# Patient Record
Sex: Female | Born: 1980 | Race: White | Hispanic: No | Marital: Married | Smoking: Former smoker
Health system: Southern US, Community
[De-identification: ages and names within clinical notes are randomized; demographics above are authoritative.]

## PROBLEM LIST (undated history)

## (undated) ENCOUNTER — Inpatient Hospital Stay (HOSPITAL_COMMUNITY): Payer: Self-pay

## (undated) DIAGNOSIS — G43909 Migraine, unspecified, not intractable, without status migrainosus: Secondary | ICD-10-CM

## (undated) DIAGNOSIS — N809 Endometriosis, unspecified: Secondary | ICD-10-CM

## (undated) DIAGNOSIS — G40909 Epilepsy, unspecified, not intractable, without status epilepticus: Secondary | ICD-10-CM

## (undated) DIAGNOSIS — O24419 Gestational diabetes mellitus in pregnancy, unspecified control: Secondary | ICD-10-CM

## (undated) DIAGNOSIS — D563 Thalassemia minor: Secondary | ICD-10-CM

## (undated) DIAGNOSIS — D569 Thalassemia, unspecified: Secondary | ICD-10-CM

## (undated) DIAGNOSIS — I639 Cerebral infarction, unspecified: Secondary | ICD-10-CM

## (undated) DIAGNOSIS — F419 Anxiety disorder, unspecified: Secondary | ICD-10-CM

## (undated) DIAGNOSIS — F32A Depression, unspecified: Secondary | ICD-10-CM

## (undated) DIAGNOSIS — R569 Unspecified convulsions: Secondary | ICD-10-CM

## (undated) DIAGNOSIS — A64 Unspecified sexually transmitted disease: Secondary | ICD-10-CM

## (undated) DIAGNOSIS — K589 Irritable bowel syndrome without diarrhea: Secondary | ICD-10-CM

## (undated) DIAGNOSIS — I63212 Cerebral infarction due to unspecified occlusion or stenosis of left vertebral arteries: Secondary | ICD-10-CM

## (undated) HISTORY — DX: Cerebral infarction, unspecified: I63.9

## (undated) HISTORY — DX: Irritable bowel syndrome, unspecified: K58.9

## (undated) HISTORY — DX: Anxiety disorder, unspecified: F41.9

## (undated) HISTORY — DX: Thalassemia minor: D56.3

## (undated) HISTORY — DX: Unspecified convulsions: R56.9

## (undated) HISTORY — DX: Migraine, unspecified, not intractable, without status migrainosus: G43.909

## (undated) HISTORY — DX: Unspecified sexually transmitted disease: A64

## (undated) HISTORY — DX: Cerebral infarction due to unspecified occlusion or stenosis of left vertebral artery: I63.212

## (undated) HISTORY — DX: Depression, unspecified: F32.A

## (undated) HISTORY — DX: Endometriosis, unspecified: N80.9

## (undated) HISTORY — DX: Gestational diabetes mellitus in pregnancy, unspecified control: O24.419

---
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
## 2005-10-19 ENCOUNTER — Other Ambulatory Visit: Admission: RE | Admit: 2005-10-19 | Discharge: 2005-10-19 | Payer: Self-pay | Admitting: Internal Medicine

## 2007-02-11 ENCOUNTER — Emergency Department (HOSPITAL_COMMUNITY): Admission: EM | Admit: 2007-02-11 | Discharge: 2007-02-11 | Payer: Self-pay | Admitting: Emergency Medicine

## 2007-10-01 ENCOUNTER — Ambulatory Visit: Payer: Self-pay | Admitting: *Deleted

## 2007-10-01 ENCOUNTER — Encounter (INDEPENDENT_AMBULATORY_CARE_PROVIDER_SITE_OTHER): Payer: Self-pay | Admitting: Orthopedic Surgery

## 2007-10-01 ENCOUNTER — Ambulatory Visit (HOSPITAL_COMMUNITY): Admission: RE | Admit: 2007-10-01 | Discharge: 2007-10-01 | Payer: Self-pay | Admitting: Orthopedic Surgery

## 2009-09-26 ENCOUNTER — Emergency Department (HOSPITAL_COMMUNITY): Admission: EM | Admit: 2009-09-26 | Discharge: 2009-09-26 | Payer: Self-pay | Admitting: Emergency Medicine

## 2010-05-02 ENCOUNTER — Emergency Department (HOSPITAL_COMMUNITY): Admission: EM | Admit: 2010-05-02 | Discharge: 2010-05-02 | Payer: Self-pay | Admitting: Emergency Medicine

## 2010-07-18 ENCOUNTER — Emergency Department (HOSPITAL_COMMUNITY)
Admission: EM | Admit: 2010-07-18 | Discharge: 2010-07-18 | Payer: Self-pay | Source: Home / Self Care | Admitting: Emergency Medicine

## 2010-07-26 ENCOUNTER — Other Ambulatory Visit (HOSPITAL_COMMUNITY): Payer: Self-pay | Admitting: Obstetrics & Gynecology

## 2010-07-26 DIAGNOSIS — H539 Unspecified visual disturbance: Secondary | ICD-10-CM

## 2010-08-02 ENCOUNTER — Encounter (HOSPITAL_COMMUNITY): Payer: Self-pay

## 2010-08-02 ENCOUNTER — Ambulatory Visit (HOSPITAL_COMMUNITY)
Admission: RE | Admit: 2010-08-02 | Discharge: 2010-08-02 | Disposition: A | Discharge status: Home / Self Care | Payer: Medicare HMO | Source: Ambulatory Visit | Attending: Obstetrics & Gynecology | Admitting: Obstetrics & Gynecology

## 2010-08-02 DIAGNOSIS — H539 Unspecified visual disturbance: Secondary | ICD-10-CM

## 2010-08-02 DIAGNOSIS — Z1389 Encounter for screening for other disorder: Secondary | ICD-10-CM | POA: Insufficient documentation

## 2010-08-02 DIAGNOSIS — O358XX0 Maternal care for other (suspected) fetal abnormality and damage, not applicable or unspecified: Secondary | ICD-10-CM | POA: Insufficient documentation

## 2010-08-02 DIAGNOSIS — Z363 Encounter for antenatal screening for malformations: Secondary | ICD-10-CM | POA: Insufficient documentation

## 2010-09-05 LAB — URINALYSIS, ROUTINE W REFLEX MICROSCOPIC
Glucose, UA: NEGATIVE mg/dL
Hgb urine dipstick: NEGATIVE
Ketones, ur: NEGATIVE mg/dL
Protein, ur: NEGATIVE mg/dL
Urobilinogen, UA: 0.2 mg/dL (ref 0.0–1.0)

## 2010-09-05 LAB — DIFFERENTIAL
Basophils Absolute: 0 10*3/uL (ref 0.0–0.1)
Eosinophils Absolute: 0.1 10*3/uL (ref 0.0–0.7)
Lymphs Abs: 2.1 10*3/uL (ref 0.7–4.0)
Monocytes Absolute: 0.6 10*3/uL (ref 0.1–1.0)
Neutrophils Relative %: 65 % (ref 43–77)

## 2010-09-05 LAB — BASIC METABOLIC PANEL
BUN: 12 mg/dL (ref 6–23)
CO2: 24 mEq/L (ref 19–32)
Calcium: 8.9 mg/dL (ref 8.4–10.5)
Chloride: 109 mEq/L (ref 96–112)
Creatinine, Ser: 0.64 mg/dL (ref 0.4–1.2)

## 2010-09-05 LAB — CBC
MCH: 20.6 pg — ABNORMAL LOW (ref 26.0–34.0)
MCHC: 32.1 g/dL (ref 30.0–36.0)
MCV: 64.2 fL — ABNORMAL LOW (ref 78.0–100.0)
Platelets: 172 10*3/uL (ref 150–400)
RDW: 15.4 % (ref 11.5–15.5)

## 2010-09-05 LAB — WET PREP, GENITAL: Clue Cells Wet Prep HPF POC: NONE SEEN

## 2010-09-05 LAB — GC/CHLAMYDIA PROBE AMP, GENITAL: Chlamydia, DNA Probe: NEGATIVE

## 2010-09-13 LAB — URINALYSIS, ROUTINE W REFLEX MICROSCOPIC
Ketones, ur: NEGATIVE mg/dL
Nitrite: NEGATIVE
Specific Gravity, Urine: 1.023 (ref 1.005–1.030)
pH: 5.5 (ref 5.0–8.0)

## 2010-09-13 LAB — POCT PREGNANCY, URINE: Preg Test, Ur: NEGATIVE

## 2010-09-13 LAB — POCT I-STAT, CHEM 8
Calcium, Ion: 1.26 mmol/L (ref 1.12–1.32)
Chloride: 106 mEq/L (ref 96–112)
HCT: 37 % (ref 36.0–46.0)
Sodium: 140 mEq/L (ref 135–145)

## 2010-09-24 ENCOUNTER — Inpatient Hospital Stay (HOSPITAL_COMMUNITY): Admission: AD | Admit: 2010-09-24 | Payer: Medicare HMO | Source: Ambulatory Visit | Admitting: Obstetrics & Gynecology

## 2010-10-18 ENCOUNTER — Encounter: Payer: Medicare HMO | Attending: Obstetrics & Gynecology

## 2010-10-18 DIAGNOSIS — Z713 Dietary counseling and surveillance: Secondary | ICD-10-CM | POA: Insufficient documentation

## 2010-10-18 DIAGNOSIS — O99810 Abnormal glucose complicating pregnancy: Secondary | ICD-10-CM | POA: Insufficient documentation

## 2010-12-25 ENCOUNTER — Inpatient Hospital Stay (HOSPITAL_COMMUNITY)
Admission: AD | Admit: 2010-12-25 | Discharge: 2010-12-26 | Disposition: A | Discharge status: Home / Self Care | Payer: Medicare HMO | Source: Ambulatory Visit | Attending: Obstetrics | Admitting: Obstetrics

## 2010-12-25 DIAGNOSIS — O479 False labor, unspecified: Secondary | ICD-10-CM | POA: Insufficient documentation

## 2011-01-07 ENCOUNTER — Inpatient Hospital Stay (HOSPITAL_COMMUNITY): Payer: Managed Care, Other (non HMO) | Admitting: Anesthesiology

## 2011-01-07 ENCOUNTER — Encounter (HOSPITAL_COMMUNITY): Payer: Self-pay | Admitting: Anesthesiology

## 2011-01-07 ENCOUNTER — Inpatient Hospital Stay (HOSPITAL_COMMUNITY): Admission: RE | Admit: 2011-01-07 | Payer: Medicare HMO | Source: Ambulatory Visit

## 2011-01-07 ENCOUNTER — Inpatient Hospital Stay (HOSPITAL_COMMUNITY)
Admission: AD | Admit: 2011-01-07 | Discharge: 2011-01-10 | DRG: 775 | Disposition: A | Discharge status: Home / Self Care | Payer: Managed Care, Other (non HMO) | Source: Ambulatory Visit | Attending: Obstetrics and Gynecology | Admitting: Obstetrics and Gynecology

## 2011-01-07 ENCOUNTER — Encounter (HOSPITAL_COMMUNITY): Payer: Self-pay | Admitting: *Deleted

## 2011-01-07 DIAGNOSIS — O99814 Abnormal glucose complicating childbirth: Secondary | ICD-10-CM | POA: Diagnosis present

## 2011-01-07 DIAGNOSIS — D62 Acute posthemorrhagic anemia: Secondary | ICD-10-CM | POA: Diagnosis not present

## 2011-01-07 DIAGNOSIS — O480 Post-term pregnancy: Secondary | ICD-10-CM | POA: Diagnosis present

## 2011-01-07 DIAGNOSIS — O9903 Anemia complicating the puerperium: Secondary | ICD-10-CM | POA: Diagnosis not present

## 2011-01-07 DIAGNOSIS — G40909 Epilepsy, unspecified, not intractable, without status epilepticus: Secondary | ICD-10-CM

## 2011-01-07 DIAGNOSIS — O24919 Unspecified diabetes mellitus in pregnancy, unspecified trimester: Secondary | ICD-10-CM | POA: Diagnosis present

## 2011-01-07 HISTORY — DX: Epilepsy, unspecified, not intractable, without status epilepticus: G40.909

## 2011-01-07 LAB — ABO/RH: RH Type: POSITIVE

## 2011-01-07 LAB — RUBELLA ANTIBODY, IGM: Rubella: IMMUNE

## 2011-01-07 LAB — HIV ANTIBODY (ROUTINE TESTING W REFLEX): HIV: NONREACTIVE

## 2011-01-07 LAB — CBC
HCT: 35.9 % — ABNORMAL LOW (ref 36.0–46.0)
Hemoglobin: 11.9 g/dL — ABNORMAL LOW (ref 12.0–15.0)
MCV: 65.3 fL — ABNORMAL LOW (ref 78.0–100.0)
RDW: 15.4 % (ref 11.5–15.5)
WBC: 13.1 10*3/uL — ABNORMAL HIGH (ref 4.0–10.5)

## 2011-01-07 LAB — TYPE AND SCREEN: Antibody Screen: NEGATIVE

## 2011-01-07 LAB — STREP B DNA PROBE: GBS: NEGATIVE

## 2011-01-07 LAB — GLUCOSE, CAPILLARY

## 2011-01-07 MED ORDER — FENTANYL 2.5 MCG/ML BUPIVACAINE 1/10 % EPIDURAL INFUSION (WH - ANES)
14.0000 mL/h | INTRAMUSCULAR | Status: DC
  Administered 2011-01-08: 14 mL/h via EPIDURAL
  Filled 2011-01-07 (×2): qty 60

## 2011-01-07 MED ORDER — PHENYLEPHRINE 40 MCG/ML (10ML) SYRINGE FOR IV PUSH (FOR BLOOD PRESSURE SUPPORT)
80.0000 ug | PREFILLED_SYRINGE | INTRAVENOUS | Status: DC | PRN
  Filled 2011-01-07: qty 5

## 2011-01-07 MED ORDER — EPHEDRINE 5 MG/ML INJ: 10.0000 mg | INTRAVENOUS | Status: DC | PRN

## 2011-01-07 MED ORDER — ONDANSETRON HCL 4 MG/2ML IJ SOLN: 4.0000 mg | Freq: Four times a day (QID) | INTRAMUSCULAR | Status: DC | PRN

## 2011-01-07 MED ORDER — PHENYLEPHRINE 40 MCG/ML (10ML) SYRINGE FOR IV PUSH (FOR BLOOD PRESSURE SUPPORT): 80.0000 ug | PREFILLED_SYRINGE | INTRAVENOUS | Status: DC | PRN

## 2011-01-07 MED ORDER — OXYCODONE-ACETAMINOPHEN 5-325 MG PO TABS: 2.0000 | ORAL_TABLET | ORAL | Status: DC | PRN

## 2011-01-07 MED ORDER — DIPHENHYDRAMINE HCL 50 MG/ML IJ SOLN: 12.5000 mg | INTRAMUSCULAR | Status: DC | PRN

## 2011-01-07 MED ORDER — OXCARBAZEPINE 300 MG PO TABS
150.0000 mg | ORAL_TABLET | Freq: Every day | ORAL | Status: DC
  Administered 2011-01-08 – 2011-01-09 (×2): 150 mg via ORAL
  Administered 2011-01-10: 11:00:00 via ORAL
  Filled 2011-01-07 (×3): qty 1

## 2011-01-07 MED ORDER — OXCARBAZEPINE 300 MG PO TABS
300.0000 mg | ORAL_TABLET | Freq: Every day | ORAL | Status: DC
  Administered 2011-01-07 – 2011-01-09 (×3): 300 mg via ORAL
  Filled 2011-01-07 (×2): qty 1

## 2011-01-07 MED ORDER — LIDOCAINE HCL 1.5 % IJ SOLN
INTRAMUSCULAR | Status: DC | PRN
  Administered 2011-01-07 (×2): 4 mL

## 2011-01-07 MED ORDER — CITRIC ACID-SODIUM CITRATE 334-500 MG/5ML PO SOLN: 30.0000 mL | ORAL | Status: DC | PRN

## 2011-01-07 MED ORDER — LACTATED RINGERS IV SOLN: 500.0000 mL | INTRAVENOUS | Status: DC | PRN

## 2011-01-07 MED ORDER — LIDOCAINE HCL (PF) 1 % IJ SOLN
30.0000 mL | INTRAMUSCULAR | Status: DC | PRN
  Administered 2011-01-08: 30 mL via SUBCUTANEOUS
  Filled 2011-01-07: qty 30

## 2011-01-07 MED ORDER — TERBUTALINE SULFATE 1 MG/ML IJ SOLN: 0.2500 mg | Freq: Once | INTRAMUSCULAR | Status: AC | PRN

## 2011-01-07 MED ORDER — OXYTOCIN 20 UNITS IN LACTATED RINGERS INFUSION - SIMPLE: 125.0000 mL/h | Freq: Once | INTRAVENOUS | Status: DC

## 2011-01-07 MED ORDER — LACTATED RINGERS IV SOLN
INTRAVENOUS | Status: DC
  Administered 2011-01-07: 16:00:00 via INTRAVENOUS

## 2011-01-07 MED ORDER — IBUPROFEN 600 MG PO TABS: 600.0000 mg | ORAL_TABLET | Freq: Four times a day (QID) | ORAL | Status: DC | PRN

## 2011-01-07 MED ORDER — FOLIC ACID 1 MG PO TABS
5.0000 mg | ORAL_TABLET | Freq: Every day | ORAL | Status: DC
  Administered 2011-01-08 – 2011-01-09 (×2): 5 mg via ORAL
  Filled 2011-01-07 (×3): qty 5

## 2011-01-07 MED ORDER — ACETAMINOPHEN 325 MG PO TABS: 650.0000 mg | ORAL_TABLET | ORAL | Status: DC | PRN

## 2011-01-07 MED ORDER — OXYTOCIN 20 UNITS IN LACTATED RINGERS INFUSION - SIMPLE
1.0000 m[IU]/min | INTRAVENOUS | Status: DC
  Administered 2011-01-07: 2 m[IU]/min via INTRAVENOUS
  Filled 2011-01-07: qty 1000

## 2011-01-07 MED ORDER — LACTATED RINGERS IV SOLN: 500.0000 mL | Freq: Once | INTRAVENOUS | Status: DC

## 2011-01-07 MED ORDER — EPHEDRINE 5 MG/ML INJ
10.0000 mg | INTRAVENOUS | Status: DC | PRN
  Filled 2011-01-07: qty 4

## 2011-01-07 NOTE — Anesthesia Procedure Notes (Signed)
Epidural Patient location during procedure: OB Start time: 01/07/2011 9:38 PM  Staffing Anesthesiologist: Lovey Crupi A. Performed by: anesthesiologist   Preanesthetic Checklist Completed: patient identified, site marked, surgical consent, pre-op evaluation, timeout performed, IV checked, risks and benefits discussed and monitors and equipment checked  Epidural Patient position: sitting Prep: site prepped and draped and DuraPrep Patient monitoring: continuous pulse ox and blood pressure Approach: midline Injection technique: LOR air  Needle Needle type: Tuohy  Needle gauge: 17 G Needle length: 9 cm Catheter type: closed end flexible Catheter size: 19 Gauge Test dose: negative and 1.5% lidocaine  Assessment Events: blood not aspirated, injection not painful, no injection resistance, negative IV test and no paresthesia  Additional Notes Pt. Tolerated procedure well. More comfortable now. Refer to RN notes for VS & FHR

## 2011-01-07 NOTE — H&P (Signed)
Shaton Paske is a 30 y.o. female G1P0 [redacted]w[redacted]d presenting for Induction of labor, post-dates and GDMA1 well controled.  OB History    Grav Para Term Preterm Abortions TAB SAB Ect Mult Living   1 0             Past Medical History  Diagnosis Date  . Nocturnal epilepsy   . Thalassemia minor    Past Surgical History  Procedure Date  . Wisdom tooth extraction 2002   Family History: N/C Social History:  reports that she quit smoking about 5 years ago. She has never used smokeless tobacco. She reports that she does not drink alcohol or use illicit drugs. Current facility-administered medications:acetaminophen (TYLENOL) tablet 650 mg, 650 mg, Oral, Q4H PRN, Lenoard Aden, MD;  citric acid-sodium citrate (ORACIT) solution 30 mL, 30 mL, Oral, Q2H PRN, Lenoard Aden, MD;  diphenhydrAMINE (BENADRYL) injection 12.5 mg, 12.5 mg, Intravenous, Q15 min PRN, Tyrone Apple. Foster;  ePHEDrine injection 10 mg, 10 mg, Intravenous, PRN, Tyrone Apple. Foster ePHEDrine injection 10 mg, 10 mg, Intravenous, PRN, Tyrone Apple. Foster;  fentaNYL 2.5 mcg/ml w/bupivacaine 0.1% epidural (60 ml syringe), 14 mL/hr, Epidural, Continuous, Michael A. Foster;  ibuprofen (ADVIL,MOTRIN) tablet 600 mg, 600 mg, Oral, Q6H PRN, Lenoard Aden, MD;  lactated ringers infusion 500 mL, 500 mL, Intravenous, Once, Michael A. Foster;  lactated ringers infusion 500-1,000 mL, 500-1,000 mL, Intravenous, PRN, Lenoard Aden, MD lactated ringers infusion, , Intravenous, Continuous, Lenoard Aden, MD, Last Rate: 125 mL/hr at 01/07/11 1615;  lidocaine (XYLOCAINE) 1 % injection 30 mL, 30 mL, Subcutaneous, PRN, Lenoard Aden, MD;  ondansetron [facility]) injection 4 mg, 4 mg, Intravenous, Q6H PRN, Lenoard Aden, MD;  oxyCODONE-acetaminophen (PERCOCET) 5-325 MG per tablet 2 tablet, 2 tablet, Oral, Q3H PRN, Lenoard Aden, MD oxytocin (PITOCIN) IV infusion 20 units in LR 1000 mL, 125 mL/hr, Intravenous, Once, Lenoard Aden, MD;  oxytocin  (PITOCIN) IV infusion 20 units in LR 1000 mL, 1-40 milli-units/min, Intravenous, Titrated, Lenoard Aden, MD, Last Rate: 30 mL/hr at 01/07/11 1634, 10 milli-units/min at 01/07/11 1634;  phenylephrine injection 80 mcg, 80 mcg, Intravenous, PRN, Tyrone Apple. Foster phenylephrine injection 80 mcg, 80 mcg, Intravenous, PRN, Tyrone Apple. Foster;  terbutaline (BRETHINE) injection 0.25 mg, 0.25 mg, Subcutaneous, Once PRN, Lenoard Aden, MD  Allergies  Allergen Reactions  . Codeine Other (See Comments)    fever  . Penicillins Rash  . Sulfa Antibiotics Rash  . Erythromycin Other (See Comments)    unknown    Dilation: 4 Effacement (%): 80 Station: -1 Exam by:: dr. Seymour Bars Blood pressure 114/87, pulse 103, temperature 98.1 F (36.7 C), temperature source Oral, resp. rate 20, height 5\' 5"  (1.651 m), weight 102.967 kg (227 lb).  [facility] AGA, cephalic. Lower limbs wnl.  HPP: Normal pregnancy other than GDMA1.  AGA per [place].  Seizure disorder with no recent seizure on Rx.  Prenatal labs: ABO, Rh:  A Pos Antibody: Negative (07/15 0000) Rubella:  Immune RPR: NON REACTIVE (07/15 0850)  HBsAg: Negative (07/15 0000)  HIV: Non-reactive (07/15 0000)  Genetic testing:  Quad neg [place] anato: wnl 1 hr GTT: Abnormal, 3 hr GTT abn GBS: Negative (07/15 0000)   Assessment/Plan: G1 40 1/7 wks, induction post dates/GDMA1 well controled on diet.  FHR monitoring reassuring. Active labor currently on Pitocin, post SROM.  Epidural PRN.   Expectant management, monitoring.  Jari Dipasquale,MARIE-LYNE MD 01/07/2011, 7:28 PM

## 2011-01-07 NOTE — Anesthesia Preprocedure Evaluation (Signed)
Anesthesia Evaluation  Name, MR# and DOB Patient awake  General Assessment Comment  Reviewed: Allergy & Precautions, H&P  and Patient's Chart, lab work & pertinent test results  Airway Mallampati: III TM Distance: >3 FB Neck ROM: full    Dental No notable dental hx (+) Teeth Intact   Pulmonaryneg pulmonary ROS    clear to auscultation  pulmonary exam normal   Cardiovascular regular Normal   Neuro/Psych (+) {AN ROS/MED HX NEURO HEADACHES Seizures -, Nocturnal Epilepsy controlled with Trileptal Negative Psych ROS  GI/Hepatic/Renal negative Liver ROS, and negative Renal ROS (+)       Endo/Other   (+) Diabetes mellitus-, Well Controlled, Gestational  Abdominal   Musculoskeletal  Hematology Thalassemia minor    Peds  Reproductive/Obstetrics (+) Pregnancy   Anesthesia Other Findings             Anesthesia Physical Anesthesia Plan  ASA: III  Anesthesia Plan: Epidural   Post-op Pain Management:    Induction:   Airway Management Planned:   Additional Equipment:   Intra-op Plan:   Post-operative Plan:   Informed Consent: I have reviewed the patients History and Physical, chart, labs and discussed the procedure including the risks, benefits and alternatives for the proposed anesthesia with the patient or authorized representative who has indicated his/her understanding and acceptance.     Plan Discussed with: Anesthesiologist  Anesthesia Plan Comments:         Anesthesia Quick Evaluation

## 2011-01-08 ENCOUNTER — Encounter (HOSPITAL_COMMUNITY): Payer: Self-pay | Admitting: *Deleted

## 2011-01-08 LAB — GLUCOSE, CAPILLARY: Glucose-Capillary: 64 mg/dL — ABNORMAL LOW (ref 70–99)

## 2011-01-08 MED ORDER — WITCH HAZEL-GLYCERIN EX PADS: MEDICATED_PAD | CUTANEOUS | Status: DC | PRN

## 2011-01-08 MED ORDER — ONDANSETRON HCL 4 MG/2ML IJ SOLN: 4.0000 mg | INTRAMUSCULAR | Status: DC | PRN

## 2011-01-08 MED ORDER — IBUPROFEN 600 MG PO TABS
600.0000 mg | ORAL_TABLET | Freq: Four times a day (QID) | ORAL | Status: DC
  Administered 2011-01-08 – 2011-01-10 (×9): 600 mg via ORAL
  Filled 2011-01-08 (×9): qty 1

## 2011-01-08 MED ORDER — ONDANSETRON HCL 4 MG PO TABS: 4.0000 mg | ORAL_TABLET | ORAL | Status: DC | PRN

## 2011-01-08 MED ORDER — OXYCODONE-ACETAMINOPHEN 5-325 MG PO TABS
1.0000 | ORAL_TABLET | ORAL | Status: DC | PRN
  Administered 2011-01-08 – 2011-01-10 (×5): 1 via ORAL
  Filled 2011-01-08 (×5): qty 1

## 2011-01-08 MED ORDER — PRENATAL PLUS 27-1 MG PO TABS
1.0000 | ORAL_TABLET | Freq: Every day | ORAL | Status: DC
  Administered 2011-01-08 – 2011-01-09 (×2): 1 via ORAL
  Filled 2011-01-08 (×2): qty 1

## 2011-01-08 MED ORDER — TETANUS-DIPHTH-ACELL PERTUSSIS 5-2.5-18.5 LF-MCG/0.5 IM SUSP
0.5000 mL | Freq: Once | INTRAMUSCULAR | Status: AC
  Administered 2011-01-09: 0.5 mL via INTRAMUSCULAR
  Filled 2011-01-08: qty 0.5

## 2011-01-08 MED ORDER — BENZOCAINE-MENTHOL 20-0.5 % EX AERO
INHALATION_SPRAY | CUTANEOUS | Status: AC
  Filled 2011-01-08: qty 56

## 2011-01-08 MED ORDER — SENNOSIDES-DOCUSATE SODIUM 8.6-50 MG PO TABS
1.0000 | ORAL_TABLET | Freq: Every day | ORAL | Status: DC
  Administered 2011-01-08 – 2011-01-09 (×2): 1 via ORAL

## 2011-01-08 MED ORDER — SIMETHICONE 80 MG PO CHEW: 80.0000 mg | CHEWABLE_TABLET | ORAL | Status: DC | PRN

## 2011-01-08 MED ORDER — BENZOCAINE-MENTHOL 20-0.5 % EX AERO
1.0000 "application " | INHALATION_SPRAY | CUTANEOUS | Status: DC | PRN
  Administered 2011-01-08: 1 via TOPICAL

## 2011-01-08 MED ORDER — DIPHENHYDRAMINE HCL 25 MG PO CAPS: 25.0000 mg | ORAL_CAPSULE | Freq: Four times a day (QID) | ORAL | Status: DC | PRN

## 2011-01-08 MED ORDER — LANOLIN HYDROUS EX OINT: TOPICAL_OINTMENT | CUTANEOUS | Status: DC | PRN

## 2011-01-08 MED ORDER — ZOLPIDEM TARTRATE 5 MG PO TABS: 5.0000 mg | ORAL_TABLET | Freq: Every evening | ORAL | Status: DC | PRN

## 2011-01-08 NOTE — Progress Notes (Signed)
Lactation Brochure given to mom and reviewed with mom, community resources for breastfeeding moms reviewed, advised of outpatient services if needed. Mom plans to rent a DEBP for home use, paperwork given. To call for next feeding to observe latch.

## 2011-01-08 NOTE — Progress Notes (Signed)
01/08/11  3:12 AM  PATIENT:  Marilyn Black  30 y.o. female G1 [redacted]w[redacted]d  Anesthesia: Epidural  1st stage of labor: Normal, SROM clear, FHR monitoring reassuring.  2nd stage of labor: 1:39 min, FHR reassuring, SVD boy 1 loose [place], Apgars 9-9.  3rd stage: Placenta spont. 3 V, complete.  Perineal/vulvar tear:  2nd degree perineal tear repaired under Local with vicryl 2-0, 3-0.  EBL: 300 cc  No Cx  Plan of care: Transfer to [facility] floor  Jayanth Szczesniak,MARIE-LYNE MD

## 2011-01-09 ENCOUNTER — Encounter (HOSPITAL_COMMUNITY): Payer: Self-pay | Admitting: Obstetrics and Gynecology

## 2011-01-09 DIAGNOSIS — G40909 Epilepsy, unspecified, not intractable, without status epilepticus: Secondary | ICD-10-CM

## 2011-01-09 HISTORY — DX: Epilepsy, unspecified, not intractable, without status epilepticus: G40.909

## 2011-01-09 LAB — CBC
MCH: 21.3 pg — ABNORMAL LOW (ref 26.0–34.0)
MCV: 66.1 fL — ABNORMAL LOW (ref 78.0–100.0)
Platelets: 164 10*3/uL (ref 150–400)
RDW: 15.3 % (ref 11.5–15.5)
WBC: 12.9 10*3/uL — ABNORMAL HIGH (ref 4.0–10.5)

## 2011-01-09 NOTE — Addendum Note (Signed)
Addendum  created 01/09/11 0815 by Tyrone Apple. Rashod Gougeon   Modules edited:Notes Section

## 2011-01-09 NOTE — Progress Notes (Addendum)
  PPD #1/SVD  S:  Pt reports feeling well/ Tolerating po/ Voiding without problems/ No n/v/ Bleeding is moderate/ Pain controlled withprescription NSAID's including percocet  Newborn info: Female/breast feeding/needs circ   O:  A & O x 3 / VS: Blood pressure 114/80, pulse 84, temperature 97.9 F (36.6 C), temperature source Oral, resp. rate 18; SpO2 99.00%  LABS:  WBC  Date Value Range Status  01/09/2011 12.9* 4.0-10.5 (K/uL) Final     Hemoglobin  Date Value Range Status  01/09/2011 9.1* 12.0-15.0 (g/dL) Final     DELTA CHECK NOTED     REPEATED TO VERIFY     HCT  Date Value Range Status  01/09/2011 28.3* 36.0-46.0 (%) Final     Platelets  Date Value Range Status  01/09/2011 164  150-400 (K/uL) Final    RH POS  Rubella Immune   Abdomen: Soft, NT, FF U-2  Perineum: well approx; no edema  Lochia: mod  Extremities: no edema; no calf pain    A/P: PPD # 1/ G1P1001   Doing well  Continue routine post partum orders  Circ today and anticipate d/c in am.    Note reviewed, accept co-sign for Russella Dar MD 01/19/2011

## 2011-01-09 NOTE — Anesthesia Postprocedure Evaluation (Signed)
  Anesthesia Post-op Note  Patient: Marilyn Black  Procedure(s) Performed: * No procedures listed *  Patient Location: PACU and Labor and Delivery  Anesthesia Type: Epidural  Level of Consciousness: awake, alert  and oriented  Airway and Oxygen Therapy: Patient Spontanous Breathing  Post-op Pain: none  Post-op Assessment: Post-op Vital signs reviewed  Post-op Vital Signs: Reviewed and stable  Complications: No apparent anesthesia complications

## 2011-01-09 NOTE — Progress Notes (Signed)
Mom asking questions about pump rental. Discussed rental vs purchase. States she thinks insurance will cover rental. Will call and confirm that. [employer] set up for Mom by RN. Reviewed setup and cleaning of pump pieces. No further questions at this time. Reports that baby is breastfeeding well.

## 2011-01-10 MED ORDER — IBUPROFEN 600 MG PO TABS: 600.0000 mg | ORAL_TABLET | Freq: Four times a day (QID) | ORAL | Status: AC

## 2011-01-10 NOTE — Progress Notes (Addendum)
  PPD #2  Subjective: Pt reports feeling well/ Tolerating po/ Voiding without problems/ No n/v/ Bleeding is light/ Pain controlled withibuprofen (OTC)  Newborn info:  Information for the patient's newborn:  Angelette, Ganus [[id number]]  female  / circ complete/ Feeding: breast    Objective:  VS: Blood pressure 119/81, pulse 75, temperature 97.9 F (36.6 C), temperature source Oral, resp. rate 18, height 5\' 5"  (1.651 m), weight 102.967 kg (227 lb), SpO2 99.00%, unknown if currently breastfeeding.  No intake or output data in the 24 hours ending 01/10/11 0829      Basename 01/09/11 0527 01/07/11 0850  WBC 12.9* 13.1*  HGB 9.1* 11.9*  HCT 28.3* 35.9*  PLT 164 172    Physical Exam:  General: alert and no distress CV: Regular rate and rhythm Resp: clear Abdomen: soft, nontender, normal bowel sounds Uterine Fundus: firm, below umbilicus, nontender Perineum: is normal, healing with good reapproximation and no evidence of infection or separation Lochia: minimal Ext: extremities normal, atraumatic, no cyanosis or edema   A/P: PPD # 2/ 29 y.o., G1P1001 S/P:spontaneous vaginal Mild ABL Anemia.  Rx Ferralet 90 po QD #30 refill x 1 Doing well and stable for d/c home. OTC ibuprofen 600mg  po Q 6 hrs prn and colace 100 to 200mg  po QD prn Urged use of dermaplast and sitz bath for peri pain Routine pp visit in 6 wks   Note reviewed, accept co-sign for Arlana Lindau. Genia Del MD 01/19/2011

## 2011-01-11 NOTE — Discharge Summary (Signed)
Obstetric Discharge Summary Reason for Admission: induction of labor and GDM A1, Seizure disorder Prenatal Procedures: ultrasound Intrapartum Procedures: spontaneous vaginal delivery Postpartum Procedures: none Complications-Operative and Postpartum: 2nd degree perineal laceration  Hemoglobin  Date Value Range Status  01/09/2011 9.1* 12.0-15.0 (g/dL) Final     DELTA CHECK NOTED     REPEATED TO VERIFY     HCT  Date Value Range Status  01/09/2011 28.3* 36.0-46.0 (%) Final    Discharge Diagnoses: Term Pregnancy-delivered and Seizure disorder  Discharge Information: Date: 01/11/2011 Activity: pelvic rest Diet: routine Medications: PNV and Trileptal 150mg  and 300mg  po QD Condition: stable Instructions: refer to practice specific booklet Discharge to: home   Newborn Data: Live born  Information for the patient's newborn:  Saidee, Geremia [[id number]]  female ; APGAR 9, 9 ; weight ; 8lb 9oz Home with mother.  Makylah Bossard K 01/11/2011, 2:03 PM

## 2011-01-12 ENCOUNTER — Encounter (HOSPITAL_COMMUNITY)
Admission: RE | Admit: 2011-01-12 | Discharge: 2011-01-12 | Disposition: A | Discharge status: Home / Self Care | Payer: Managed Care, Other (non HMO) | Source: Ambulatory Visit | Attending: Obstetrics and Gynecology | Admitting: Obstetrics and Gynecology

## 2011-01-12 DIAGNOSIS — O923 Agalactia: Secondary | ICD-10-CM | POA: Insufficient documentation

## 2011-01-16 ENCOUNTER — Ambulatory Visit (HOSPITAL_COMMUNITY)
Admission: RE | Admit: 2011-01-16 | Discharge: 2011-01-16 | Disposition: A | Discharge status: Home / Self Care | Payer: Managed Care, Other (non HMO) | Source: Ambulatory Visit | Attending: Obstetrics and Gynecology | Admitting: Obstetrics and Gynecology

## 2011-01-16 NOTE — Progress Notes (Signed)
BABY:  OSCAR Sadler  DOB:  [birth year]  79 DAYS OLD  BW 8-9.  D/C WT 7-10.   WT AT PEDI ON 01/11/11  7-2   17% WT LOSS MOM:  Georgie Farino  PATIENT HERE FOR FEEDING ASSESSMENT DUE TO WEIGHT LOSS AND POOR MILK SUPPLY.  PATIENT VERBALIZES STRONG COMMITMENT  AND DESIRE TO EXCLUSIVELY BREASTFEED BABY.  PT STATES SHE BREASTFEEDS EVERY 3 HOURS FOR 30 MINUTES EACH BREAST.  SHE THEN SUPPLEMENTS WITH 15 CCS OF FORMULA PER SYRINGE.  SUPPLEMENT STARTED 4 DAYS AGO.  PATIENT STARTED PUMPING WITH SYMPHONY PUMP AFTER EACH FEEDING YESTERDAY AND OBTAINS 2-3 CCS.  FEEDING OBSERVED WITH GOOD LATCH AND NUTRITIVE FEEDING X BEFORE BABY BECOMES SLEEPY.  BABY NURSED 20 MIN ON LEFT BREAST WITH 10CC WEIGHT GAIN.  NURSED ON RIGHT BREAST FOR 10 MIN. WITH 4 CC GAIN.  SNS SET UP WITH 20 CCS OF FORMULA AND BABY TOOK 20CCS WITH MUCH IMPROVED SUCKING PATTERN.  PLAN IS TO CONTINUE EVERY 3 HOUR BREASTFEEDING USING SNS WITH 30CC EBM/FORMULA, PUMP BOTH BREASTS AFTER EACH FEEDING X 15 MIN AND CALL MD TO DISCUSS REGLAN.  PT WILL CALL FOR F/U IN ONE WEEK.  PEDI WEIGHT CHECK FRI. 7/27

## 2011-01-20 ENCOUNTER — Encounter (HOSPITAL_COMMUNITY): Payer: Managed Care, Other (non HMO)

## 2011-02-01 ENCOUNTER — Ambulatory Visit (HOSPITAL_COMMUNITY)
Admission: RE | Admit: 2011-02-01 | Discharge: 2011-02-01 | Disposition: A | Discharge status: Home / Self Care | Payer: Managed Care, Other (non HMO) | Source: Ambulatory Visit | Attending: Obstetrics & Gynecology | Admitting: Obstetrics & Gynecology

## 2011-02-01 ENCOUNTER — Ambulatory Visit (HOSPITAL_COMMUNITY): Admission: RE | Admit: 2011-02-01 | Payer: Managed Care, Other (non HMO) | Source: Ambulatory Visit

## 2011-02-01 NOTE — Progress Notes (Signed)
BABY:  Marilyn Black  DOB  [birth year]  BIRTH WEIGHT 8-9  WEIGHT TODAY 7-11.6 MOTHER:  Marilyn Black  BABY IS 39 DAYS OLD WITH WEIGHT   GAIN OF ONLY 3.6 0Z/13 DAYS.   BABY IS ALERT WTIH GOOD MUSCLE TONE.   MOTHER STATES SHE BREASTFEEDS EVERY 2-3 HOURS X 10-60 MIN.  PUMPING AFTER FEEDINGS 5 TIMES PER DAY OBTAINING 3-5 MLS.  SUPPLEMENTS WITH 30 MLS OF FORMULA 3 TIMES IN 24 HRS. PER SNS.  MOTHER STATES BABY CHOKES ON BOTTLE.  REPORTS 8 VOIDS/8 GREEN STOOLS/24 HOURS. BABY OBSERVED AT BREAST WITH GOOD LATCH BUT VERY POOR NON-NUTRITIVE SUCK.  MOTHER  WAS NOT AWARE THIS WAS NOT EFFECTIVE SUCKING.  BABY NURSED FOR 20 MIN AND PC WEIGHT GAIN ONLY 2 MLS.  DISCUSSED AT LENGTH THE REALITY OF POOR SUPPLY AND THE IMPORTANCE OF BABY RECEIVING 2-3 OZ OF FORMULA EVERY FEEDING.  MOTHER PRAISED FOR ALL HER EFFORTS AND ALLOWED TO EXPRESS HER DISAPPOINTMENT.  BABY INTRODUCED TO BOTTLE AND IT DID TAKE SEVERAL MINUTES BEFORE BABY DEVELOPED GOOD SUCK/SWALLOW THEN  BABY TOOK 2 OZ OF FORMULA AND TOLERATED WELL.  PATIENT INSTRUCTED TO GET ANOTHER WEIGHT CHECK AT PEDI. IN NEXT FEW DAYS.

## 2011-02-12 ENCOUNTER — Encounter (HOSPITAL_COMMUNITY)
Admission: RE | Admit: 2011-02-12 | Discharge: 2011-02-12 | Disposition: A | Discharge status: Home / Self Care | Payer: Managed Care, Other (non HMO) | Source: Ambulatory Visit | Attending: Obstetrics and Gynecology | Admitting: Obstetrics and Gynecology

## 2011-02-12 DIAGNOSIS — O923 Agalactia: Secondary | ICD-10-CM | POA: Insufficient documentation

## 2011-03-15 ENCOUNTER — Encounter (HOSPITAL_COMMUNITY)
Admission: RE | Admit: 2011-03-15 | Discharge: 2011-03-15 | Disposition: A | Discharge status: Home / Self Care | Payer: Managed Care, Other (non HMO) | Source: Ambulatory Visit | Attending: Obstetrics and Gynecology | Admitting: Obstetrics and Gynecology

## 2011-03-15 DIAGNOSIS — O923 Agalactia: Secondary | ICD-10-CM | POA: Insufficient documentation

## 2011-04-06 LAB — RAPID URINE DRUG SCREEN, HOSP PERFORMED
Amphetamines: NOT DETECTED
Barbiturates: NOT DETECTED
Benzodiazepines: NOT DETECTED
Cocaine: NOT DETECTED
Opiates: NOT DETECTED
Tetrahydrocannabinol: NOT DETECTED

## 2011-04-06 LAB — I-STAT 8, (EC8 V) (CONVERTED LAB)
Acid-base deficit: 5 — ABNORMAL HIGH
BUN: 13
Chloride: 106
Glucose, Bld: 94
pCO2, Ven: 33.8 — ABNORMAL LOW
pH, Ven: 7.364 — ABNORMAL HIGH

## 2011-04-06 LAB — POCT I-STAT CREATININE: Operator id: 285841

## 2011-04-06 LAB — ETHANOL: Alcohol, Ethyl (B): <5

## 2011-04-15 ENCOUNTER — Encounter (HOSPITAL_COMMUNITY)
Admission: RE | Admit: 2011-04-15 | Discharge: 2011-04-15 | Disposition: A | Discharge status: Home / Self Care | Payer: Managed Care, Other (non HMO) | Source: Ambulatory Visit | Attending: Obstetrics and Gynecology | Admitting: Obstetrics and Gynecology

## 2011-04-15 DIAGNOSIS — O923 Agalactia: Secondary | ICD-10-CM | POA: Insufficient documentation

## 2011-05-16 ENCOUNTER — Encounter (HOSPITAL_COMMUNITY)
Admission: RE | Admit: 2011-05-16 | Discharge: 2011-05-16 | Disposition: A | Discharge status: Home / Self Care | Payer: Managed Care, Other (non HMO) | Source: Ambulatory Visit | Attending: Obstetrics and Gynecology | Admitting: Obstetrics and Gynecology

## 2011-05-16 DIAGNOSIS — O923 Agalactia: Secondary | ICD-10-CM | POA: Insufficient documentation

## 2011-06-16 ENCOUNTER — Encounter (HOSPITAL_COMMUNITY)
Admission: RE | Admit: 2011-06-16 | Discharge: 2011-06-16 | Disposition: A | Discharge status: Home / Self Care | Payer: Managed Care, Other (non HMO) | Source: Ambulatory Visit | Attending: Obstetrics and Gynecology | Admitting: Obstetrics and Gynecology

## 2011-06-16 DIAGNOSIS — O923 Agalactia: Secondary | ICD-10-CM | POA: Insufficient documentation

## 2011-07-17 ENCOUNTER — Encounter (HOSPITAL_COMMUNITY): Payer: Managed Care, Other (non HMO) | Attending: Obstetrics and Gynecology

## 2011-07-17 DIAGNOSIS — O923 Agalactia: Secondary | ICD-10-CM | POA: Insufficient documentation

## 2011-08-17 ENCOUNTER — Encounter (HOSPITAL_COMMUNITY)
Admission: RE | Admit: 2011-08-17 | Discharge: 2011-08-17 | Disposition: A | Discharge status: Home / Self Care | Payer: Managed Care, Other (non HMO) | Source: Ambulatory Visit | Attending: Obstetrics and Gynecology | Admitting: Obstetrics and Gynecology

## 2011-08-17 DIAGNOSIS — O923 Agalactia: Secondary | ICD-10-CM | POA: Insufficient documentation

## 2011-09-16 ENCOUNTER — Encounter (HOSPITAL_COMMUNITY)
Admission: RE | Admit: 2011-09-16 | Discharge: 2011-09-16 | Disposition: A | Discharge status: Home / Self Care | Payer: Managed Care, Other (non HMO) | Source: Ambulatory Visit | Attending: Obstetrics and Gynecology | Admitting: Obstetrics and Gynecology

## 2011-09-16 DIAGNOSIS — O923 Agalactia: Secondary | ICD-10-CM | POA: Insufficient documentation

## 2011-10-17 ENCOUNTER — Encounter (HOSPITAL_COMMUNITY)
Admission: RE | Admit: 2011-10-17 | Discharge: 2011-10-17 | Disposition: A | Discharge status: Home / Self Care | Payer: Managed Care, Other (non HMO) | Source: Ambulatory Visit | Attending: Obstetrics and Gynecology | Admitting: Obstetrics and Gynecology

## 2011-10-17 DIAGNOSIS — O923 Agalactia: Secondary | ICD-10-CM | POA: Insufficient documentation

## 2012-12-05 DIAGNOSIS — G40109 Localization-related (focal) (partial) symptomatic epilepsy and epileptic syndromes with simple partial seizures, not intractable, without status epilepticus: Secondary | ICD-10-CM | POA: Insufficient documentation

## 2014-02-28 ENCOUNTER — Emergency Department (HOSPITAL_COMMUNITY)
Admission: EM | Admit: 2014-02-28 | Discharge: 2014-02-28 | Disposition: A | Discharge status: Home / Self Care | Payer: Managed Care, Other (non HMO) | Attending: Emergency Medicine | Admitting: Emergency Medicine

## 2014-02-28 ENCOUNTER — Encounter (HOSPITAL_COMMUNITY): Payer: Self-pay | Admitting: Emergency Medicine

## 2014-02-28 DIAGNOSIS — R52 Pain, unspecified: Secondary | ICD-10-CM | POA: Insufficient documentation

## 2014-02-28 DIAGNOSIS — Z79899 Other long term (current) drug therapy: Secondary | ICD-10-CM | POA: Diagnosis not present

## 2014-02-28 DIAGNOSIS — M25569 Pain in unspecified knee: Secondary | ICD-10-CM | POA: Diagnosis present

## 2014-02-28 DIAGNOSIS — Z880 Allergy status to penicillin: Secondary | ICD-10-CM | POA: Diagnosis not present

## 2014-02-28 DIAGNOSIS — Z87891 Personal history of nicotine dependence: Secondary | ICD-10-CM | POA: Insufficient documentation

## 2014-02-28 DIAGNOSIS — Z8669 Personal history of other diseases of the nervous system and sense organs: Secondary | ICD-10-CM | POA: Insufficient documentation

## 2014-02-28 DIAGNOSIS — Z862 Personal history of diseases of the blood and blood-forming organs and certain disorders involving the immune mechanism: Secondary | ICD-10-CM | POA: Diagnosis not present

## 2014-02-28 DIAGNOSIS — M25561 Pain in right knee: Secondary | ICD-10-CM

## 2014-02-28 MED ORDER — OXYCODONE-ACETAMINOPHEN 5-325 MG PO TABS: 1.0000 | ORAL_TABLET | ORAL | Status: DC | PRN

## 2014-02-28 MED ORDER — IBUPROFEN 800 MG PO TABS: 800.0000 mg | ORAL_TABLET | Freq: Three times a day (TID) | ORAL | Status: DC

## 2014-02-28 MED ORDER — IBUPROFEN 400 MG PO TABS
800.0000 mg | ORAL_TABLET | Freq: Once | ORAL | Status: AC
  Administered 2014-02-28: 800 mg via ORAL
  Filled 2014-02-28: qty 2

## 2014-02-28 MED ORDER — OXYCODONE-ACETAMINOPHEN 5-325 MG PO TABS
1.0000 | ORAL_TABLET | Freq: Once | ORAL | Status: DC
  Filled 2014-02-28: qty 1

## 2014-02-28 MED ORDER — OXYCODONE-ACETAMINOPHEN 5-325 MG PO TABS
1.0000 | ORAL_TABLET | Freq: Once | ORAL | Status: AC
  Administered 2014-02-28: 1 via ORAL
  Filled 2014-02-28: qty 1

## 2014-02-28 NOTE — ED Provider Notes (Signed)
Medical screening examination/treatment/procedure(s) were performed by non-physician practitioner and as supervising physician I was immediately available for consultation/collaboration.   EKG Interpretation None        Chanti Golubski M Adamari Frede, MD 02/28/14 2202 

## 2014-02-28 NOTE — Discharge Instructions (Signed)
Cryotherapy °Cryotherapy means treatment with cold. Ice or gel packs can be used to reduce both pain and swelling. Ice is the most helpful within the first 24 to 48 hours after an injury or flare-up from overusing a muscle or joint. Sprains, strains, spasms, burning pain, shooting pain, and aches can all be eased with ice. Ice can also be used when recovering from surgery. Ice is effective, has very few side effects, and is safe for most people to use. °PRECAUTIONS  °Ice is not a safe treatment option for people with: °· Raynaud phenomenon. This is a condition affecting small blood vessels in the extremities. Exposure to cold may cause your problems to return. °· Cold hypersensitivity. There are many forms of cold hypersensitivity, including: °· Cold urticaria. Red, itchy hives appear on the skin when the tissues begin to warm after being iced. °· Cold erythema. This is a red, itchy rash caused by exposure to cold. °· Cold hemoglobinuria. Red blood cells break down when the tissues begin to warm after being iced. The hemoglobin that carry oxygen are passed into the urine because they cannot combine with blood proteins fast enough. °· Numbness or altered sensitivity in the area being iced. °If you have any of the following conditions, do not use ice until you have discussed cryotherapy with your caregiver: °· Heart conditions, such as arrhythmia, angina, or chronic heart disease. °· High blood pressure. °· Healing wounds or open skin in the area being iced. °· Current infections. °· Rheumatoid arthritis. °· Poor circulation. °· Diabetes. °Ice slows the blood flow in the region it is applied. This is beneficial when trying to stop inflamed tissues from spreading irritating chemicals to surrounding tissues. However, if you expose your skin to cold temperatures for too long or without the proper protection, you can damage your skin or nerves. Watch for signs of skin damage due to cold. °HOME CARE INSTRUCTIONS °Follow  these tips to use ice and cold packs safely. °· Place a dry or damp towel between the ice and skin. A damp towel will cool the skin more quickly, so you may need to shorten the time that the ice is used. °· For a more rapid response, add gentle compression to the ice. °· Ice for no more than 10 to 20 minutes at a time. The bonier the area you are icing, the less time it will take to get the benefits of ice. °· Check your skin after 5 minutes to make sure there are no signs of a poor response to cold or skin damage. °· Rest 20 minutes or more between uses. °· Once your skin is numb, you can end your treatment. You can test numbness by very lightly touching your skin. The touch should be so light that you do not see the skin dimple from the pressure of your fingertip. When using ice, most people will feel these normal sensations in this order: cold, burning, aching, and numbness. °· Do not use ice on someone who cannot communicate their responses to pain, such as small children or people with dementia. °HOW TO MAKE AN ICE PACK °Ice packs are the most common way to use ice therapy. Other methods include ice massage, ice baths, and cryosprays. Muscle creams that cause a cold, tingly feeling do not offer the same benefits that ice offers and should not be used as a substitute unless recommended by your caregiver. °To make an ice pack, do one of the following: °· Place crushed ice or a   bag of frozen vegetables in a sealable plastic bag. Squeeze out the excess air. Place this bag inside another plastic bag. Slide the bag into a pillowcase or place a damp towel between your skin and the bag. °· Mix 3 parts water with 1 part rubbing alcohol. Freeze the mixture in a sealable plastic bag. When you remove the mixture from the freezer, it will be slushy. Squeeze out the excess air. Place this bag inside another plastic bag. Slide the bag into a pillowcase or place a damp towel between your skin and the bag. °SEEK MEDICAL CARE  IF: °· You develop white spots on your skin. This may give the skin a blotchy (mottled) appearance. °· Your skin turns blue or pale. °· Your skin becomes waxy or hard. °· Your swelling gets worse. °MAKE SURE YOU:  °· Understand these instructions. °· Will watch your condition. °· Will get help right away if you are not doing well or get worse. °Document Released: 02/05/2011 Document Revised: 10/26/2013 Document Reviewed: 02/05/2011 °ExitCare® Patient Information ©2015 [employer]. This information is not intended to replace advice given to you by your health care provider. Make sure you discuss any questions you have with your health care provider. ° °Arthralgia °Your caregiver has diagnosed you as suffering from an arthralgia. Arthralgia means there is pain in a joint. This can come from many reasons including: °· Bruising the joint which causes soreness (inflammation) in the joint. °· Wear and tear on the joints which occur as we grow older (osteoarthritis). °· Overusing the joint. °· Various forms of arthritis. °· Infections of the joint. °Regardless of the cause of pain in your joint, most of these different pains respond to anti-inflammatory drugs and rest. The exception to this is when a joint is infected, and these cases are treated with antibiotics, if it is a bacterial infection. °HOME CARE INSTRUCTIONS  °· Rest the injured area for as long as directed by your caregiver. Then slowly start using the joint as directed by your caregiver and as the pain allows. Crutches as directed may be useful if the ankles, knees or hips are involved. If the knee was splinted or casted, continue use and care as directed. If an stretchy or elastic wrapping bandage has been applied today, it should be removed and re-applied every 3 to 4 hours. It should not be applied tightly, but firmly enough to keep swelling down. Watch toes and feet for swelling, bluish discoloration, coldness, numbness or excessive pain. If any of these  problems (symptoms) occur, remove the ace bandage and re-apply more loosely. If these symptoms persist, contact your caregiver or return to this location. °· For the first 24 hours, keep the injured extremity elevated on pillows while lying down. °· Apply ice for 15-20 minutes to the sore joint every couple hours while awake for the first half day. Then 03-04 times per day for the first 48 hours. Put the ice in a plastic bag and place a towel between the bag of ice and your skin. °· Wear any splinting, casting, elastic bandage applications, or slings as instructed. °· Only take over-the-counter or prescription medicines for pain, discomfort, or fever as directed by your caregiver. Do not use aspirin immediately after the injury unless instructed by your physician. Aspirin can cause increased bleeding and bruising of the tissues. °· If you were given crutches, continue to use them as instructed and do not resume weight bearing on the sore joint until instructed. °Persistent pain and inability   to use the sore joint as directed for more than 2 to 3 days are warning signs indicating that you should see a caregiver for a follow-up visit as soon as possible. Initially, a hairline fracture (break in bone) may not be evident on X-rays. Persistent pain and swelling indicate that further evaluation, non-weight bearing or use of the joint (use of crutches or slings as instructed), or further X-rays are indicated. X-rays may sometimes not show a small fracture until a week or 10 days later. Make a follow-up appointment with your own caregiver or one to whom we have referred you. A radiologist (specialist in reading X-rays) may read your X-rays. Make sure you know how you are to obtain your X-ray results. Do not assume everything is normal if you do not hear from us. °SEEK MEDICAL CARE IF: °Bruising, swelling, or pain increases. °SEEK IMMEDIATE MEDICAL CARE IF:  °· Your fingers or toes are numb or blue. °· The pain is not  responding to medications and continues to stay the same or get worse. °· The pain in your joint becomes severe. °· You develop a fever over 102° F (38.9° C). °· It becomes impossible to move or use the joint. °MAKE SURE YOU:  °· Understand these instructions. °· Will watch your condition. °· Will get help right away if you are not doing well or get worse. °Document Released: 06/11/2005 Document Revised: 09/03/2011 Document Reviewed: 01/28/2008 °ExitCare® Patient Information ©2015 [employer]. This information is not intended to replace advice given to you by your health care provider. Make sure you discuss any questions you have with your health care provider. ° °

## 2014-02-28 NOTE — ED Notes (Signed)
Pt reports injuring her right knee while dancing last night, went to pcp and sent here to r/o torn ligament.

## 2014-02-28 NOTE — ED Notes (Signed)
PT refused crutches becase pt has some at home

## 2014-02-28 NOTE — ED Provider Notes (Signed)
CSN: [id number]     Arrival date & time 02/28/14  1113 History  This chart was scribed for non-physician practitioner, Elpidio Anis, PA-C working with Lyanne Co, MD, by Jarvis Morgan, ED Scribe. This patient was seen in room TR09C/TR09C and the patient's care was started at 12:28 PM.    Chief Complaint  Patient presents with  . Knee Pain      The history is provided by the patient. No language interpreter was used.   HPI Comments: Marilyn Black is a 33 y.o. female with a h/o nocturnal epilepsy, thalassemia minor, and seizure disorder who presents to the Emergency Department complaining of constant, moderate,  "7/10" right knee pain since last night. Pt was at a wedding last night and was dancing and states she injured it. She states that she twisted the knee and heard and pop and had to limp back over to a seat. She notes some associated swelling to her right knee. She has not taken anything for the pain. She has been icing the area with mild relief. She also wrapped the area with a compression wrap. She went to her PCP and was sent here to rule out a torn ligament. She denies any skin discoloration or numbness.      Past Medical History  Diagnosis Date  . Nocturnal epilepsy   . Thalassemia minor   . Postpartum care following vaginal delivery 01/09/2011  . Seizure disorder 01/09/2011   Past Surgical History  Procedure Laterality Date  . Wisdom tooth extraction  2002   History reviewed. No pertinent family history. History  Substance Use Topics  . Smoking status: Former Smoker    Quit date: 07/09/2005  . Smokeless tobacco: Never Used  . Alcohol Use: No   OB History   Grav Para Term Preterm Abortions TAB SAB Ect Mult Living   Review of Systems  Musculoskeletal: Positive for arthralgias (right knee), gait problem and joint swelling (right knee).  Skin: Negative for color change.  Neurological: Negative for numbness.  All other systems reviewed and are  negative.     Allergies  Codeine; Penicillins; Sulfa antibiotics; and Erythromycin  Home Medications   Prior to Admission medications   Medication Sig Start Date End Date Taking? Authorizing Provider  folic acid (FOLVITE) 1 MG tablet Take 5 mg by mouth daily.     Historical Provider, MD  OXcarbazepine (TRILEPTAL) 150 MG tablet Take 150 mg by mouth daily with breakfast.     Historical Provider, MD  Oxcarbazepine (TRILEPTAL) 300 MG tablet Take 300 mg by mouth at bedtime.     Historical Provider, MD   Triage Vitals: BP 119/84  Pulse 87  Temp(Src) 98.4 F (36.9 C) (Oral)  Resp 16  Ht  (1.651 m)  Wt 218 lb (98.884 kg)  BMI 36.28 kg/m2  SpO2 100%  LMP 02/25/2014  Physical Exam  Nursing note and vitals reviewed. Constitutional: She is oriented to person, place, and time. She appears well-developed and well-nourished. No distress.  HENT:  Head: Normocephalic and atraumatic.  Eyes: Conjunctivae and EOM are normal.  Neck: Neck supple. No tracheal deviation present.  Cardiovascular: Normal rate.   Pulmonary/Chest: Effort normal. No respiratory distress.  Musculoskeletal: Normal range of motion.  Right knee: moderately swollen, question diffusion. No redness or bony deformity. Joint is stable. Pulses intact.  Neurological: She is alert and oriented to person, place, and time.  Skin: Skin is  warm and dry.  Psychiatric: She has a normal mood and affect. Her behavior is normal.    ED Course  Procedures (including critical care time)  DIAGNOSTIC STUDIES: Oxygen Saturation is 100% on RA, normal by my interpretation.    COORDINATION OF CARE: 12:39 PM- Discussed with pt that i would be ordering a knee immobilizer, Percocet and Ibuprofen. Advised pt that if she is still unable to bear weight by Friday that she would need to f/u with Orthopedist. Will provide a referral for Orthopedic specialist.  Pt advised of plan for treatment and pt agrees.     Labs Review Labs Reviewed -  No data to display  Imaging Review No results found.   EKG Interpretation None      MDM   Final diagnoses:  None    1. Right knee strain  No joint laxity on exam limited by pain, doubt ligamentous rupture. Knee immobilizer offered but patient declined due to discomfort with fully extending knee. Pain management, crutches, ortho follow up prn. I personally performed the services described in this documentation, which was scribed in my presence. The recorded information has been reviewed and is accurate.     Arnoldo Hooker, PA-C 02/28/14 1341

## 2014-02-28 NOTE — ED Notes (Signed)
Pt refused percocet but changed and requested med after the med was wasted, New order placed for Pt.

## 2014-03-23 ENCOUNTER — Ambulatory Visit: Payer: Managed Care, Other (non HMO) | Attending: Sports Medicine | Admitting: Physical Therapy

## 2014-03-23 DIAGNOSIS — IMO0001 Reserved for inherently not codable concepts without codable children: Secondary | ICD-10-CM | POA: Diagnosis not present

## 2014-03-23 DIAGNOSIS — R262 Difficulty in walking, not elsewhere classified: Secondary | ICD-10-CM | POA: Insufficient documentation

## 2014-03-23 DIAGNOSIS — M25669 Stiffness of unspecified knee, not elsewhere classified: Secondary | ICD-10-CM | POA: Diagnosis not present

## 2014-03-23 DIAGNOSIS — M25569 Pain in unspecified knee: Secondary | ICD-10-CM | POA: Insufficient documentation

## 2014-03-23 DIAGNOSIS — R609 Edema, unspecified: Secondary | ICD-10-CM | POA: Diagnosis not present

## 2014-03-26 ENCOUNTER — Ambulatory Visit: Payer: Managed Care, Other (non HMO) | Attending: Sports Medicine | Admitting: Physical Therapy

## 2014-03-26 DIAGNOSIS — M25661 Stiffness of right knee, not elsewhere classified: Secondary | ICD-10-CM | POA: Diagnosis not present

## 2014-03-26 DIAGNOSIS — R609 Edema, unspecified: Secondary | ICD-10-CM | POA: Insufficient documentation

## 2014-03-26 DIAGNOSIS — G40909 Epilepsy, unspecified, not intractable, without status epilepticus: Secondary | ICD-10-CM | POA: Insufficient documentation

## 2014-03-26 DIAGNOSIS — M238X1 Other internal derangements of right knee: Secondary | ICD-10-CM | POA: Insufficient documentation

## 2014-03-26 DIAGNOSIS — M25561 Pain in right knee: Secondary | ICD-10-CM | POA: Insufficient documentation

## 2014-03-26 DIAGNOSIS — R262 Difficulty in walking, not elsewhere classified: Secondary | ICD-10-CM | POA: Diagnosis not present

## 2014-03-30 ENCOUNTER — Ambulatory Visit: Payer: Managed Care, Other (non HMO) | Admitting: Physical Therapy

## 2014-03-30 DIAGNOSIS — M238X1 Other internal derangements of right knee: Secondary | ICD-10-CM | POA: Diagnosis not present

## 2014-04-02 ENCOUNTER — Ambulatory Visit: Payer: Managed Care, Other (non HMO) | Admitting: Physical Therapy

## 2014-04-02 DIAGNOSIS — M238X1 Other internal derangements of right knee: Secondary | ICD-10-CM | POA: Diagnosis not present

## 2014-04-05 ENCOUNTER — Ambulatory Visit: Payer: Managed Care, Other (non HMO) | Admitting: Physical Therapy

## 2014-04-05 DIAGNOSIS — M238X1 Other internal derangements of right knee: Secondary | ICD-10-CM | POA: Diagnosis not present

## 2014-04-08 ENCOUNTER — Ambulatory Visit: Payer: Managed Care, Other (non HMO) | Admitting: Physical Therapy

## 2014-04-08 DIAGNOSIS — M238X1 Other internal derangements of right knee: Secondary | ICD-10-CM | POA: Diagnosis not present

## 2014-04-12 ENCOUNTER — Ambulatory Visit: Payer: Managed Care, Other (non HMO) | Admitting: Physical Therapy

## 2014-04-12 DIAGNOSIS — M238X1 Other internal derangements of right knee: Secondary | ICD-10-CM | POA: Diagnosis not present

## 2014-04-16 ENCOUNTER — Ambulatory Visit: Payer: Managed Care, Other (non HMO) | Admitting: Physical Therapy

## 2014-04-16 DIAGNOSIS — M238X1 Other internal derangements of right knee: Secondary | ICD-10-CM | POA: Diagnosis not present

## 2014-04-20 ENCOUNTER — Ambulatory Visit: Payer: Managed Care, Other (non HMO) | Admitting: Physical Therapy

## 2014-04-20 DIAGNOSIS — M238X1 Other internal derangements of right knee: Secondary | ICD-10-CM | POA: Diagnosis not present

## 2014-04-26 ENCOUNTER — Encounter (HOSPITAL_COMMUNITY): Payer: Self-pay | Admitting: Emergency Medicine

## 2014-04-27 ENCOUNTER — Ambulatory Visit: Payer: Private Health Insurance - Indemnity | Attending: Sports Medicine | Admitting: Physical Therapy

## 2014-04-27 DIAGNOSIS — R262 Difficulty in walking, not elsewhere classified: Secondary | ICD-10-CM | POA: Insufficient documentation

## 2014-04-27 DIAGNOSIS — R609 Edema, unspecified: Secondary | ICD-10-CM | POA: Diagnosis not present

## 2014-04-27 DIAGNOSIS — M25561 Pain in right knee: Secondary | ICD-10-CM | POA: Insufficient documentation

## 2014-04-27 DIAGNOSIS — G40909 Epilepsy, unspecified, not intractable, without status epilepticus: Secondary | ICD-10-CM | POA: Insufficient documentation

## 2014-04-27 DIAGNOSIS — M238X1 Other internal derangements of right knee: Secondary | ICD-10-CM | POA: Diagnosis present

## 2014-04-27 DIAGNOSIS — M25661 Stiffness of right knee, not elsewhere classified: Secondary | ICD-10-CM | POA: Insufficient documentation

## 2014-05-04 ENCOUNTER — Ambulatory Visit: Payer: Private Health Insurance - Indemnity | Admitting: Physical Therapy

## 2014-05-04 DIAGNOSIS — M238X1 Other internal derangements of right knee: Secondary | ICD-10-CM | POA: Diagnosis not present

## 2014-05-07 ENCOUNTER — Ambulatory Visit: Payer: Private Health Insurance - Indemnity | Admitting: Physical Therapy

## 2014-05-07 DIAGNOSIS — M238X1 Other internal derangements of right knee: Secondary | ICD-10-CM | POA: Diagnosis not present

## 2014-05-17 ENCOUNTER — Ambulatory Visit: Payer: Private Health Insurance - Indemnity | Admitting: Physical Therapy

## 2014-05-17 DIAGNOSIS — M238X1 Other internal derangements of right knee: Secondary | ICD-10-CM | POA: Diagnosis not present

## 2015-03-04 LAB — OB RESULTS CONSOLE RUBELLA ANTIBODY, IGM: RUBELLA: IMMUNE

## 2015-03-04 LAB — OB RESULTS CONSOLE RPR: RPR: NONREACTIVE

## 2015-03-04 LAB — OB RESULTS CONSOLE ABO/RH: RH TYPE: POSITIVE

## 2015-03-04 LAB — OB RESULTS CONSOLE ANTIBODY SCREEN: ANTIBODY SCREEN: NEGATIVE

## 2015-03-04 LAB — OB RESULTS CONSOLE HIV ANTIBODY (ROUTINE TESTING): HIV: NONREACTIVE

## 2015-03-04 LAB — OB RESULTS CONSOLE HEPATITIS B SURFACE ANTIGEN: Hepatitis B Surface Ag: NEGATIVE

## 2015-03-15 LAB — OB RESULTS CONSOLE GC/CHLAMYDIA
Chlamydia: NEGATIVE
Gonorrhea: NEGATIVE

## 2015-04-06 ENCOUNTER — Encounter (HOSPITAL_COMMUNITY): Payer: Self-pay | Admitting: Obstetrics & Gynecology

## 2015-04-18 ENCOUNTER — Other Ambulatory Visit (HOSPITAL_COMMUNITY): Payer: Self-pay | Admitting: Obstetrics & Gynecology

## 2015-05-06 ENCOUNTER — Ambulatory Visit (HOSPITAL_COMMUNITY)
Admission: RE | Admit: 2015-05-06 | Discharge: 2015-05-06 | Disposition: A | Discharge status: Home / Self Care | Payer: 59 | Source: Ambulatory Visit | Attending: Obstetrics & Gynecology | Admitting: Obstetrics & Gynecology

## 2015-05-06 ENCOUNTER — Other Ambulatory Visit (HOSPITAL_COMMUNITY): Payer: Self-pay | Admitting: Obstetrics & Gynecology

## 2015-05-06 ENCOUNTER — Encounter (HOSPITAL_COMMUNITY): Payer: Self-pay

## 2015-05-06 DIAGNOSIS — O99350 Diseases of the nervous system complicating pregnancy, unspecified trimester: Secondary | ICD-10-CM | POA: Diagnosis not present

## 2015-05-06 DIAGNOSIS — O99412 Diseases of the circulatory system complicating pregnancy, second trimester: Secondary | ICD-10-CM

## 2015-05-06 DIAGNOSIS — G40909 Epilepsy, unspecified, not intractable, without status epilepticus: Secondary | ICD-10-CM

## 2015-05-06 DIAGNOSIS — O99322 Drug use complicating pregnancy, second trimester: Secondary | ICD-10-CM | POA: Insufficient documentation

## 2015-05-06 DIAGNOSIS — O99352 Diseases of the nervous system complicating pregnancy, second trimester: Secondary | ICD-10-CM

## 2015-05-06 DIAGNOSIS — O99212 Obesity complicating pregnancy, second trimester: Secondary | ICD-10-CM

## 2015-05-06 DIAGNOSIS — O09299 Supervision of pregnancy with other poor reproductive or obstetric history, unspecified trimester: Secondary | ICD-10-CM | POA: Diagnosis not present

## 2015-05-06 DIAGNOSIS — Z3A18 18 weeks gestation of pregnancy: Secondary | ICD-10-CM | POA: Diagnosis not present

## 2015-05-06 DIAGNOSIS — Z8632 Personal history of gestational diabetes: Secondary | ICD-10-CM

## 2015-05-06 DIAGNOSIS — D563 Thalassemia minor: Secondary | ICD-10-CM | POA: Insufficient documentation

## 2015-05-06 DIAGNOSIS — I059 Rheumatic mitral valve disease, unspecified: Secondary | ICD-10-CM

## 2015-05-06 DIAGNOSIS — O09292 Supervision of pregnancy with other poor reproductive or obstetric history, second trimester: Secondary | ICD-10-CM

## 2015-05-06 NOTE — Progress Notes (Signed)
MFM Consult  34 year old, G2P1001, currently at 18 weeks 1 day gestation with EDD of 10/06/2015   Past Medical History  Diagnosis Date  . Nocturnal epilepsy (HCC)   . Thalassemia minor   . Postpartum care following vaginal delivery 01/09/2011  . Seizure disorder (HCC) 01/09/2011   Past Surgical History  Procedure Laterality Date  . Wisdom tooth extraction  2002   Social History   Social History  . Marital Status: Married    Spouse Name: N/A  . Number of Children: N/A  . Years of Education: N/A   Occupational History  . Not on file.   Social History Main Topics  . Smoking status: Former Smoker    Quit date: 07/09/2005  . Smokeless tobacco: Never Used  . Alcohol Use: No  . Drug Use: No  . Sexual Activity: Yes    Birth Control/ Protection: None   Other Topics Concern  . Not on file   Social History Narrative   No family history on file.  Allergies  Allergen Reactions  . Codeine Other (See Comments)    fever  . Penicillins Rash  . Sulfa Antibiotics Rash  . Erythromycin Other (See Comments)    unknown   #1 Medication exposure in pregnancy - reviewed the literature related to trileptal use in pregnancy and similar medications. Reviewed the ultrasound with the couple today including the limitations of ultrasound to detect all birth defects, aneuploidy etc.  - only limited views of the fetal heart noted today. Recommend repeat ultrasound in ~4 weeks. Normal appearing fetal growth and amniotic fluid volume. No ultrasound evidence of cleft lip or NTD. Discussed that cleft palate is difficult to ascertain on antenatal ultrasound.  #2 Thalessemia carrier - prior counseling. Not interested in more information today #3 History of gestational diabetes - had early glucose screen with abnormal 1-hour and 3-hour OGTT within normal limits. Is currently following a diabetic diet empirically with the plan to repeat glucose screen at 24-[redacted] weeks gestation or sooner for glucosuria on  clinic dip.  #4 History of seizure disorder in pregnancy - had counseling prior. No new information to report today. - [facility]  Questions appear answered to the couples satisfaction. Precautions for the above given. Spent greater than 1/2 of 20 minute visit face to face counseling.

## 2015-06-26 NOTE — L&D Delivery Note (Signed)
Delivery Note At 12:07 AM a healthy female was delivered via Vaginal, Spontaneous Delivery (Presentation: ; Occiput Anterior).  APGAR: 8, 9; weight pending.   Placenta status: Intact, Spontaneous.  Cord: 3 vessels with the following complications: None.  Cord pH: N/A  Anesthesia: Epidural  Episiotomy: None Lacerations: None Suture Repair: N/A Est. Blood Loss (mL): 350  Mom to postpartum.  Baby to Couplet care / Skin to Skin.  Neah Sporrer,MARIE-LYNE 10/06/2015, 1:11 AM

## 2015-07-13 ENCOUNTER — Encounter: Payer: 59 | Attending: Obstetrics & Gynecology

## 2015-07-13 VITALS — Ht 65.0 in | Wt 242.3 lb

## 2015-07-13 DIAGNOSIS — Z713 Dietary counseling and surveillance: Secondary | ICD-10-CM | POA: Diagnosis not present

## 2015-07-13 DIAGNOSIS — O24419 Gestational diabetes mellitus in pregnancy, unspecified control: Secondary | ICD-10-CM | POA: Diagnosis not present

## 2015-07-13 NOTE — Progress Notes (Signed)
  Patient was seen on 07/13/15 for Gestational Diabetes self-management . The following learning objectives were met by the patient :   States the definition of Gestational Diabetes  States why dietary management is important in controlling blood glucose  Describes the effects of carbohydrates on blood glucose levels  Demonstrates ability to create a balanced meal plan  Demonstrates carbohydrate counting   States when to check blood glucose levels  Demonstrates proper blood glucose monitoring techniques  States the effect of stress and exercise on blood glucose levels  States the importance of limiting caffeine and abstaining from alcohol and smoking  Plan:  Aim for 2 Carb Choices per meal (30 grams) +/- 1 either way for breakfast Aim for 3 Carb Choices per meal (45 grams) +/- 1 either way from lunch and dinner Aim for 1-2 Carbs per snack Begin reading food labels for Total Carbohydrate and sugar grams of foods Consider  increasing your activity level by walking daily as tolerated Begin checking BG before breakfast and 1-2 hours after first bit of breakfast, lunch and dinner after  as directed by MD  Take medication  as directed by MD  Blood glucose monitor given: Accu-chek aviva connect Lot # [id number] Exp: 07/25/16 Blood glucose reading: 170 immediately after eating  Patient instructed to monitor glucose levels: FBS: 60 - <90 1 hour: <140 2 hour: <120  Patient received the following handouts:  Nutrition Diabetes and Pregnancy  Carbohydrate Counting List  Meal Planning worksheet  Patient will be seen for follow-up as needed.

## 2015-07-25 ENCOUNTER — Encounter: Payer: Self-pay | Admitting: Obstetrics & Gynecology

## 2015-09-21 LAB — OB RESULTS CONSOLE GBS: GBS: POSITIVE

## 2015-09-28 ENCOUNTER — Inpatient Hospital Stay (HOSPITAL_COMMUNITY)
Admission: AD | Admit: 2015-09-28 | Discharge: 2015-09-28 | Disposition: A | Discharge status: Home / Self Care | Payer: 59 | Source: Ambulatory Visit | Attending: Obstetrics & Gynecology | Admitting: Obstetrics & Gynecology

## 2015-09-28 ENCOUNTER — Other Ambulatory Visit: Payer: Self-pay | Admitting: Obstetrics & Gynecology

## 2015-09-28 ENCOUNTER — Encounter (HOSPITAL_COMMUNITY): Payer: Self-pay | Admitting: *Deleted

## 2015-09-28 DIAGNOSIS — R071 Chest pain on breathing: Secondary | ICD-10-CM | POA: Diagnosis not present

## 2015-09-28 DIAGNOSIS — Z79899 Other long term (current) drug therapy: Secondary | ICD-10-CM | POA: Insufficient documentation

## 2015-09-28 DIAGNOSIS — O9989 Other specified diseases and conditions complicating pregnancy, childbirth and the puerperium: Secondary | ICD-10-CM | POA: Diagnosis not present

## 2015-09-28 DIAGNOSIS — G40909 Epilepsy, unspecified, not intractable, without status epilepticus: Secondary | ICD-10-CM | POA: Insufficient documentation

## 2015-09-28 DIAGNOSIS — O26893 Other specified pregnancy related conditions, third trimester: Secondary | ICD-10-CM | POA: Diagnosis not present

## 2015-09-28 DIAGNOSIS — Z87891 Personal history of nicotine dependence: Secondary | ICD-10-CM | POA: Diagnosis not present

## 2015-09-28 DIAGNOSIS — Z3A38 38 weeks gestation of pregnancy: Secondary | ICD-10-CM | POA: Insufficient documentation

## 2015-09-28 DIAGNOSIS — R0602 Shortness of breath: Secondary | ICD-10-CM | POA: Diagnosis present

## 2015-09-28 LAB — CBC
HCT: 34.2 % — ABNORMAL LOW (ref 36.0–46.0)
HEMOGLOBIN: 11.2 g/dL — AB (ref 12.0–15.0)
MCH: 21.3 pg — ABNORMAL LOW (ref 26.0–34.0)
MCHC: 32.7 g/dL (ref 30.0–36.0)
MCV: 65 fL — ABNORMAL LOW (ref 78.0–100.0)
PLATELETS: 219 10*3/uL (ref 150–400)
RBC: 5.26 MIL/uL — AB (ref 3.87–5.11)
RDW: 15.7 % — ABNORMAL HIGH (ref 11.5–15.5)
WBC: 10.4 10*3/uL (ref 4.0–10.5)

## 2015-09-28 LAB — GLUCOSE, CAPILLARY: GLUCOSE-CAPILLARY: 75 mg/dL (ref 65–99)

## 2015-09-28 NOTE — MAU Provider Note (Signed)
History     CSN: [id number]  Arrival date and time: 09/28/15 1216   First Provider Initiated Contact with Patient 09/28/15 1241        Chief Complaint  Patient presents with  . Shortness of Breath  . Chest Pain   HPI  Marilyn Black is a 35 y.o. G2P1001 at [redacted]w[redacted]d who presents for chest pain & SOB. Was seen in office this morning by Dr. Juliene Pina & was sent here for further evaluation. Symptoms began this morning upon waking. Reports substernal chest pressure/aching that she rates 4/10. Has not treated. Also complains of shortness of breath; is able to take deeps breaths but states she doesn't feel like she's "getting enough oxygen".  Denies palpitations, syncope, heartburn, n/v, contractions, LOF, or vaginal bleeding. Denies having symptoms like this before.   Denies cough or fever.    OB History    Gravida Para Term Preterm AB TAB SAB Ectopic Multiple Living   Past Medical History  Diagnosis Date  . Nocturnal epilepsy (HCC)   . Thalassemia minor   . Postpartum care following vaginal delivery 01/09/2011  . Seizure disorder (HCC) 01/09/2011  . Gestational diabetes mellitus (GDM), antepartum     Past Surgical History  Procedure Laterality Date  . Wisdom tooth extraction  2002    Family History  Problem Relation Age of Onset  . Cancer Other   . Hypertension Other   . Diabetes Other     Social History  Substance Use Topics  . Smoking status: Former Smoker    Quit date: 07/09/2005  . Smokeless tobacco: Never Used  . Alcohol Use: No    Allergies:  Allergies  Allergen Reactions  . Codeine Other (See Comments)    fever  . Penicillins Rash  . Sulfa Antibiotics Rash  . Erythromycin Other (See Comments)    unknown    Prescriptions prior to admission  Medication Sig Dispense Refill Last Dose  . ACCU-CHEK AVIVA PLUS test strip USE 1 STRIP FOUR TIMES A DAY  3   . ACCU-CHEK FASTCLIX LANCETS MISC 1 STICK, FOUR TIMES DAILY AS DIRECTED  3   . folic acid  (FOLVITE) 1 MG tablet Take 5 mg by mouth daily.    Taking  . ibuprofen (ADVIL,MOTRIN) 800 MG tablet Take 1 tablet (800 mg total) by mouth 3 (three) times daily. 21 tablet 0   . OXcarbazepine (TRILEPTAL) 150 MG tablet Take 150 mg by mouth daily with breakfast.    01/07/2011 at Unknown  . Oxcarbazepine (TRILEPTAL) 300 MG tablet Take 300 mg by mouth at bedtime.    Taking  . oxyCODONE-acetaminophen (PERCOCET/ROXICET) 5-325 MG per tablet Take 1-2 tablets by mouth every 4 (four) hours as needed for severe pain. 15 tablet 0   . Prenatal Multivit-Min-Fe-FA (PRENATAL VITAMINS PO) Take 1 each by mouth daily.       Review of Systems  Constitutional: Negative.   Respiratory: Positive for shortness of breath. Negative for cough, sputum production and wheezing.   Cardiovascular: Positive for chest pain. Negative for palpitations.  Gastrointestinal: Negative.   Genitourinary: Negative.   Neurological: Negative for dizziness, loss of consciousness and headaches.   Physical Exam   Blood pressure 114/78, pulse 89, temperature 98.7 F (37.1 C), temperature source Oral, resp. rate 18, last menstrual period 12/30/2014, SpO2 98 %, unknown if currently breastfeeding.  Patient Vitals for the past 24 hrs:  BP Temp Temp src  Pulse Resp SpO2  09/28/15 1356 114/78 mmHg - - 81 18 -  09/28/15 1251 - - - 83 - 97 %  09/28/15 1246 - - - 84 - 100 %  09/28/15 1241 - - - 78 - 98 %  09/28/15 1236 - - - 89 - 98 %  09/28/15 1232 114/78 mmHg 98.7 F (37.1 C) Oral 87 18 -  09/28/15 1231 - - - 87 - 99 %     Physical Exam  Nursing note and vitals reviewed. Constitutional: She is oriented to person, place, and time. She appears well-developed and well-nourished. No distress.  HENT:  Head: Normocephalic and atraumatic.  Eyes: Conjunctivae are normal. Right eye exhibits no discharge. Left eye exhibits no discharge. No scleral icterus.  Neck: Normal range of motion.  Cardiovascular: Normal rate, regular rhythm and normal  heart sounds.   No murmur heard. Respiratory: Effort normal and breath sounds normal. No respiratory distress. She has no wheezes.  GI: Soft. There is no tenderness.  Neurological: She is alert and oriented to person, place, and time.  Skin: Skin is warm and dry. She is not diaphoretic.  Psychiatric: She has a normal mood and affect. Her behavior is normal. Judgment and thought content normal.   Fetal Tracing:  Baseline: 120 Variability: moderate Accelerations: 15x15 Decelerations: none  Toco: irregular   MAU Course  Procedures Results for orders placed or performed during the hospital encounter of 09/28/15 (from the past 24 hour(s))  CBC     Status: Abnormal   Collection Time: 09/28/15  1:00 PM  Result Value Ref Range   WBC 10.4 4.0 - 10.5 K/uL   RBC 5.26 (H) 3.87 - 5.11 MIL/uL   Hemoglobin 11.2 (L) 12.0 - 15.0 g/dL   HCT 45.434.2 (L) 09.836.0 - 11.946.0 %   MCV 65.0 (L) 78.0 - 100.0 fL   MCH 21.3 (L) 26.0 - 34.0 pg   MCHC 32.7 30.0 - 36.0 g/dL   RDW 14.715.7 (H) 82.911.5 - 56.215.5 %   Platelets 219 150 - 400 K/uL  Glucose, capillary     Status: None   Collection Time: 09/28/15  1:26 PM  Result Value Ref Range   Glucose-Capillary 75 65 - 99 mg/dL    MDM Reactive tracing Vital signs WNL CBG normal EKG normal sinus rhythm Pt reports improvement in symptoms since being in MAU 1249- S/w Dr. Seymour BarsLavoie regarding vital signs & assessment. May discharge home if EKG normal & CBC stable.   Assessment and Plan  A:  1. Chest pain on breathing     P: Discharge home Discussed reasons to return to MAU Keep f/u with OB  Marilyn Black 09/28/2015, 12:40 PM

## 2015-09-28 NOTE — Discharge Instructions (Signed)
Nonspecific Chest Pain  °Chest pain can be caused by many different conditions. There is always a chance that your pain could be related to something serious, such as a heart attack or a blood clot in your lungs. Chest pain can also be caused by conditions that are not life-threatening. If you have chest pain, it is very important to follow up with your health care provider. °CAUSES  °Chest pain can be caused by: °· Heartburn. °· Pneumonia or bronchitis. °· Anxiety or stress. °· Inflammation around your heart (pericarditis) or lung (pleuritis or pleurisy). °· A blood clot in your lung. °· A collapsed lung (pneumothorax). It can develop suddenly on its own (spontaneous pneumothorax) or from trauma to the chest. °· Shingles infection (varicella-zoster virus). °· Heart attack. °· Damage to the bones, muscles, and cartilage that make up your chest wall. This can include: °¨ Bruised bones due to injury. °¨ Strained muscles or cartilage due to frequent or repeated coughing or overwork. °¨ Fracture to one or more ribs. °¨ Sore cartilage due to inflammation (costochondritis). °RISK FACTORS  °Risk factors for chest pain may include: °· Activities that increase your risk for trauma or injury to your chest. °· Respiratory infections or conditions that cause frequent coughing. °· Medical conditions or overeating that can cause heartburn. °· Heart disease or family history of heart disease. °· Conditions or health behaviors that increase your risk of developing a blood clot. °· Having had chicken pox (varicella zoster). °SIGNS AND SYMPTOMS °Chest pain can feel like: °· Burning or tingling on the surface of your chest or deep in your chest. °· Crushing, pressure, aching, or squeezing pain. °· Dull or sharp pain that is worse when you move, cough, or take a deep breath. °· Pain that is also felt in your back, neck, shoulder, or arm, or pain that spreads to any of these areas. °Your chest pain may come and go, or it may stay  constant. °DIAGNOSIS °Lab tests or other studies may be needed to find the cause of your pain. Your health care provider may have you take a test called an ambulatory ECG (electrocardiogram). An ECG records your heartbeat patterns at the time the test is performed. You may also have other tests, such as: °· Transthoracic echocardiogram (TTE). During echocardiography, sound waves are used to create a picture of all of the heart structures and to look at how blood flows through your heart. °· Transesophageal echocardiogram (TEE). This is a more advanced imaging test that obtains images from inside your body. It allows your health care provider to see your heart in finer detail. °· Cardiac monitoring. This allows your health care provider to monitor your heart rate and rhythm in real time. °· Holter monitor. This is a portable device that records your heartbeat and can help to diagnose abnormal heartbeats. It allows your health care provider to track your heart activity for several days, if needed. °· Stress tests. These can be done through exercise or by taking medicine that makes your heart beat more quickly. °· Blood tests. °· Imaging tests. °TREATMENT  °Your treatment depends on what is causing your chest pain. Treatment may include: °· Medicines. These may include: °¨ Acid blockers for heartburn. °¨ Anti-inflammatory medicine. °¨ Pain medicine for inflammatory conditions. °¨ Antibiotic medicine, if an infection is present. °¨ Medicines to dissolve blood clots. °¨ Medicines to treat coronary artery disease. °· Supportive care for conditions that do not require medicines. This may include: °¨ Resting. °¨ Applying heat   or cold packs to injured areas. °¨ Limiting activities until pain decreases. °HOME CARE INSTRUCTIONS °· If you were prescribed an antibiotic medicine, finish it all even if you start to feel better. °· Avoid any activities that bring on chest pain. °· Do not use any tobacco products, including  cigarettes, chewing tobacco, or electronic cigarettes. If you need help quitting, ask your health care provider. °· Do not drink alcohol. °· Take medicines only as directed by your health care provider. °· Keep all follow-up visits as directed by your health care provider. This is important. This includes any further testing if your chest pain does not go away. °· If heartburn is the cause for your chest pain, you may be told to keep your head raised (elevated) while sleeping. This reduces the chance that acid will go from your stomach into your esophagus. °· Make lifestyle changes as directed by your health care provider. These may include: °¨ Getting regular exercise. Ask your health care provider to suggest some activities that are safe for you. °¨ Eating a heart-healthy diet. A registered dietitian can help you to learn healthy eating options. °¨ Maintaining a healthy weight. °¨ Managing diabetes, if necessary. °¨ Reducing stress. °SEEK MEDICAL CARE IF: °· Your chest pain does not go away after treatment. °· You have a rash with blisters on your chest. °· You have a fever. °SEEK IMMEDIATE MEDICAL CARE IF:  °· Your chest pain is worse. °· You have an increasing cough, or you cough up blood. °· You have severe abdominal pain. °· You have severe weakness. °· You faint. °· You have chills. °· You have sudden, unexplained chest discomfort. °· You have sudden, unexplained discomfort in your arms, back, neck, or jaw. °· You have shortness of breath at any time. °· You suddenly start to sweat, or your skin gets clammy. °· You feel nauseous or you vomit. °· You suddenly feel light-headed or dizzy. °· Your heart begins to beat quickly, or it feels like it is skipping beats. °These symptoms may represent a serious problem that is an emergency. Do not wait to see if the symptoms will go away. Get medical help right away. Call your local emergency services (911 in the [place] Do not drive yourself to the hospital. °  °This  information is not intended to replace advice given to you by your health care provider. Make sure you discuss any questions you have with your health care provider. °  °Document Released: 03/21/2005 Document Revised: 07/02/2014 Document Reviewed: 01/15/2014 °Elsevier Interactive Patient Education ©2016 [employer] ° °

## 2015-09-28 NOTE — MAU Note (Addendum)
C/O SOB, chest pain since this AM. States chest feels achy and makes her feel SOB, not a severe pain, just worrisome. Saw Dr. Juliene PinaMody in the office and was sent to MAU for further evaluation.

## 2015-09-30 ENCOUNTER — Encounter (HOSPITAL_COMMUNITY): Payer: Self-pay | Admitting: *Deleted

## 2015-09-30 ENCOUNTER — Telehealth (HOSPITAL_COMMUNITY): Payer: Self-pay | Admitting: *Deleted

## 2015-09-30 NOTE — Telephone Encounter (Signed)
Preadmission screen  

## 2015-10-03 ENCOUNTER — Other Ambulatory Visit: Payer: Self-pay | Admitting: Obstetrics & Gynecology

## 2015-10-04 ENCOUNTER — Other Ambulatory Visit: Payer: Self-pay | Admitting: Obstetrics & Gynecology

## 2015-10-05 ENCOUNTER — Inpatient Hospital Stay (HOSPITAL_COMMUNITY)
Admission: RE | Admit: 2015-10-05 | Discharge: 2015-10-07 | DRG: 775 | Disposition: A | Discharge status: Home / Self Care | Payer: 59 | Source: Ambulatory Visit | Attending: Obstetrics & Gynecology | Admitting: Obstetrics & Gynecology

## 2015-10-05 ENCOUNTER — Inpatient Hospital Stay (HOSPITAL_COMMUNITY): Payer: 59 | Admitting: Anesthesiology

## 2015-10-05 DIAGNOSIS — O99824 Streptococcus B carrier state complicating childbirth: Secondary | ICD-10-CM | POA: Diagnosis present

## 2015-10-05 DIAGNOSIS — Z3A39 39 weeks gestation of pregnancy: Secondary | ICD-10-CM | POA: Diagnosis not present

## 2015-10-05 DIAGNOSIS — Z880 Allergy status to penicillin: Secondary | ICD-10-CM

## 2015-10-05 DIAGNOSIS — D563 Thalassemia minor: Secondary | ICD-10-CM | POA: Diagnosis present

## 2015-10-05 DIAGNOSIS — O24420 Gestational diabetes mellitus in childbirth, diet controlled: Principal | ICD-10-CM | POA: Diagnosis present

## 2015-10-05 DIAGNOSIS — Z87891 Personal history of nicotine dependence: Secondary | ICD-10-CM | POA: Diagnosis not present

## 2015-10-05 DIAGNOSIS — Z8249 Family history of ischemic heart disease and other diseases of the circulatory system: Secondary | ICD-10-CM

## 2015-10-05 DIAGNOSIS — Z882 Allergy status to sulfonamides status: Secondary | ICD-10-CM | POA: Diagnosis not present

## 2015-10-05 DIAGNOSIS — O99354 Diseases of the nervous system complicating childbirth: Secondary | ICD-10-CM | POA: Diagnosis present

## 2015-10-05 DIAGNOSIS — G40802 Other epilepsy, not intractable, without status epilepticus: Secondary | ICD-10-CM | POA: Diagnosis present

## 2015-10-05 DIAGNOSIS — Z833 Family history of diabetes mellitus: Secondary | ICD-10-CM

## 2015-10-05 DIAGNOSIS — Z881 Allergy status to other antibiotic agents status: Secondary | ICD-10-CM

## 2015-10-05 DIAGNOSIS — O3663X0 Maternal care for excessive fetal growth, third trimester, not applicable or unspecified: Secondary | ICD-10-CM | POA: Diagnosis present

## 2015-10-05 DIAGNOSIS — Z888 Allergy status to other drugs, medicaments and biological substances status: Secondary | ICD-10-CM | POA: Diagnosis not present

## 2015-10-05 LAB — TYPE AND SCREEN
ABO/RH(D): A POS
Antibody Screen: NEGATIVE

## 2015-10-05 LAB — CBC
HCT: 33.9 % — ABNORMAL LOW (ref 36.0–46.0)
HEMOGLOBIN: 10.8 g/dL — AB (ref 12.0–15.0)
MCH: 20.8 pg — ABNORMAL LOW (ref 26.0–34.0)
MCHC: 31.9 g/dL (ref 30.0–36.0)
MCV: 65.4 fL — ABNORMAL LOW (ref 78.0–100.0)
PLATELETS: 214 10*3/uL (ref 150–400)
RBC: 5.18 MIL/uL — AB (ref 3.87–5.11)
RDW: 15.7 % — ABNORMAL HIGH (ref 11.5–15.5)
WBC: 9.3 10*3/uL (ref 4.0–10.5)

## 2015-10-05 LAB — RPR: RPR: NONREACTIVE

## 2015-10-05 LAB — ABO/RH: ABO/RH(D): A POS

## 2015-10-05 MED ORDER — LACTATED RINGERS IV SOLN
INTRAVENOUS | Status: DC
  Administered 2015-10-05 (×3): via INTRAVENOUS

## 2015-10-05 MED ORDER — EPHEDRINE 5 MG/ML INJ: 10.0000 mg | INTRAVENOUS | Status: DC | PRN

## 2015-10-05 MED ORDER — OXCARBAZEPINE 300 MG PO TABS
300.0000 mg | ORAL_TABLET | Freq: Two times a day (BID) | ORAL | Status: DC
  Administered 2015-10-05: 300 mg via ORAL
  Filled 2015-10-05 (×3): qty 1

## 2015-10-05 MED ORDER — OXYTOCIN 10 UNIT/ML IJ SOLN
1.0000 m[IU]/min | INTRAVENOUS | Status: DC
  Administered 2015-10-05: 2 m[IU]/min via INTRAVENOUS

## 2015-10-05 MED ORDER — LACTATED RINGERS IV SOLN
2.5000 [IU]/h | INTRAVENOUS | Status: DC
  Filled 2015-10-05: qty 4

## 2015-10-05 MED ORDER — FENTANYL 2.5 MCG/ML BUPIVACAINE 1/10 % EPIDURAL INFUSION (WH - ANES)
14.0000 mL/h | INTRAMUSCULAR | Status: DC | PRN
  Administered 2015-10-05: 14 mL/h via EPIDURAL
  Filled 2015-10-05: qty 125

## 2015-10-05 MED ORDER — LIDOCAINE HCL (PF) 1 % IJ SOLN
30.0000 mL | INTRAMUSCULAR | Status: DC | PRN
  Filled 2015-10-05: qty 30

## 2015-10-05 MED ORDER — OXYCODONE-ACETAMINOPHEN 5-325 MG PO TABS: 2.0000 | ORAL_TABLET | ORAL | Status: DC | PRN

## 2015-10-05 MED ORDER — PHENYLEPHRINE 40 MCG/ML (10ML) SYRINGE FOR IV PUSH (FOR BLOOD PRESSURE SUPPORT)
80.0000 ug | PREFILLED_SYRINGE | INTRAVENOUS | Status: DC | PRN
  Filled 2015-10-05: qty 2

## 2015-10-05 MED ORDER — LACTATED RINGERS IV SOLN: 500.0000 mL | INTRAVENOUS | Status: DC | PRN

## 2015-10-05 MED ORDER — CITRIC ACID-SODIUM CITRATE 334-500 MG/5ML PO SOLN: 30.0000 mL | ORAL | Status: DC | PRN

## 2015-10-05 MED ORDER — PHENYLEPHRINE 40 MCG/ML (10ML) SYRINGE FOR IV PUSH (FOR BLOOD PRESSURE SUPPORT)
80.0000 ug | PREFILLED_SYRINGE | INTRAVENOUS | Status: DC | PRN
  Filled 2015-10-05: qty 20

## 2015-10-05 MED ORDER — LACTATED RINGERS IV SOLN
500.0000 mL | Freq: Once | INTRAVENOUS | Status: DC
Start: 2015-10-05 — End: 2015-10-05

## 2015-10-05 MED ORDER — VANCOMYCIN HCL IN DEXTROSE 1-5 GM/200ML-% IV SOLN
1000.0000 mg | Freq: Two times a day (BID) | INTRAVENOUS | Status: DC
  Administered 2015-10-05 (×2): 1000 mg via INTRAVENOUS
  Filled 2015-10-05 (×3): qty 200

## 2015-10-05 MED ORDER — ACETAMINOPHEN 325 MG PO TABS: 650.0000 mg | ORAL_TABLET | ORAL | Status: DC | PRN

## 2015-10-05 MED ORDER — OXYTOCIN BOLUS FROM INFUSION
500.0000 mL | INTRAVENOUS | Status: DC
  Administered 2015-10-06: 500 mL via INTRAVENOUS

## 2015-10-05 MED ORDER — PHENYLEPHRINE 40 MCG/ML (10ML) SYRINGE FOR IV PUSH (FOR BLOOD PRESSURE SUPPORT): 80.0000 ug | PREFILLED_SYRINGE | INTRAVENOUS | Status: DC | PRN

## 2015-10-05 MED ORDER — LACTATED RINGERS IV SOLN
500.0000 mL | Freq: Once | INTRAVENOUS | Status: AC
  Administered 2015-10-05: 500 mL via INTRAVENOUS

## 2015-10-05 MED ORDER — LIDOCAINE HCL (PF) 1 % IJ SOLN
INTRAMUSCULAR | Status: DC | PRN
  Administered 2015-10-05 (×2): 4 mL via EPIDURAL

## 2015-10-05 MED ORDER — OXYCODONE-ACETAMINOPHEN 5-325 MG PO TABS: 1.0000 | ORAL_TABLET | ORAL | Status: DC | PRN

## 2015-10-05 MED ORDER — EPHEDRINE 5 MG/ML INJ
10.0000 mg | INTRAVENOUS | Status: DC | PRN
  Filled 2015-10-05: qty 2

## 2015-10-05 MED ORDER — ONDANSETRON HCL 4 MG/2ML IJ SOLN
4.0000 mg | Freq: Four times a day (QID) | INTRAMUSCULAR | Status: DC | PRN
Start: 2015-10-05 — End: 2015-10-06

## 2015-10-05 MED ORDER — TERBUTALINE SULFATE 1 MG/ML IJ SOLN
0.2500 mg | Freq: Once | INTRAMUSCULAR | Status: DC | PRN
  Filled 2015-10-05: qty 1

## 2015-10-05 MED ORDER — DIPHENHYDRAMINE HCL 50 MG/ML IJ SOLN: 12.5000 mg | INTRAMUSCULAR | Status: DC | PRN

## 2015-10-05 NOTE — Anesthesia Preprocedure Evaluation (Signed)
Anesthesia Evaluation  Patient identified by MRN, date of birth, ID band Patient awake    Reviewed: Allergy & Precautions, NPO status , Patient's Chart, lab work & pertinent test results  Airway Mallampati: III  TM Distance: >3 FB Neck ROM: Full    Dental  (+) Teeth Intact   Pulmonary former smoker,    breath sounds clear to auscultation       Cardiovascular negative cardio ROS   Rhythm:Regular Rate:Normal     Neuro/Psych negative neurological ROS  negative psych ROS   GI/Hepatic   Endo/Other  diabetes  Renal/GU   negative genitourinary   Musculoskeletal negative musculoskeletal ROS (+)   Abdominal   Peds negative pediatric ROS (+)  Hematology   Anesthesia Other Findings   Reproductive/Obstetrics (+) Pregnancy                             Lab Results  Component Value Date   WBC 9.3 10/05/2015   HGB 10.8* 10/05/2015   HCT 33.9* 10/05/2015   MCV 65.4* 10/05/2015   PLT 214 10/05/2015   No results found for: INR, PROTIME   Anesthesia Physical Anesthesia Plan  ASA: III  Anesthesia Plan: Epidural   Post-op Pain Management:    Induction:   Airway Management Planned:   Additional Equipment:   Intra-op Plan:   Post-operative Plan:   Informed Consent: I have reviewed the patients History and Physical, chart, labs and discussed the procedure including the risks, benefits and alternatives for the proposed anesthesia with the patient or authorized representative who has indicated his/her understanding and acceptance.     Plan Discussed with:   Anesthesia Plan Comments:         Anesthesia Quick Evaluation

## 2015-10-05 NOTE — H&P (Signed)
Marylu Ulyess MortD Dormer is a 35 y.o. female [id number] presenting for Induction re Multip with GDMA1/LGA.  HPP/HPI:  GDMA1 well controled, but EFW last US 92% (AC 98%).  Seizure disorder on Trileptal.  GBS pos, Vanco.  OB History    Gravida Para Term Preterm AB TAB SAB Ectopic Multiple Living   3 1 1  1  1   1      Past Medical History  Diagnosis Date  . Nocturnal epilepsy (HCC)   . Thalassemia minor   . Postpartum care following vaginal delivery 01/09/2011  . Seizure disorder (HCC) 01/09/2011  . Gestational diabetes mellitus (GDM), antepartum   . Thalassemia trait   . Gestational diabetes     diet controlled   Past Surgical History  Procedure Laterality Date  . Wisdom tooth extraction  2002   Family History: family history includes Cancer in her father, maternal grandfather, and maternal grandmother; Diabetes in her maternal grandmother, mother, and sister; Hypertension in her maternal grandmother and mother; Mental illness in her sister. Social History:  reports that she quit smoking about 10 years ago. She has never used smokeless tobacco. She reports that she does not drink alcohol or use illicit drugs.  Allergies  Allergen Reactions  . Codeine Other (See Comments)    fever  . Penicillins Rash    Has patient had a PCN reaction causing immediate rash, facial/tongue/throat swelling, SOB or lightheadedness with hypotension: Yes Has patient had a PCN reaction causing severe rash involving mucus membranes or skin necrosis: No Has patient had a PCN reaction that required hospitalization No Has patient had a PCN reaction occurring within the last 10 years: No If all of the above answers are "NO", then may proceed with Cephalosporin use.  . Sulfa Antibiotics Rash  . Erythromycin Other (See Comments)    unknown    Dilation: 2 Effacement (%): 80 Station: -2 Exam by:: K McLeod RN   SROM AF clear   Blood pressure 116/73, pulse 83, temperature 98 F (36.7 C), temperature source Oral,  resp. rate 18, height 5\' 5"  (1.651 m), weight 239 lb (108.41 kg), last menstrual period 12/30/2014, unknown if currently breastfeeding. Exam Physical Exam   FHR 130-140's with good variability, accelerations present.  UCs q3 min under Pitocin.  HPP:  Patient Active Problem List   Diagnosis Date Noted  . Labor and delivery, indication for care 10/05/2015  . Postpartum care following vaginal delivery 01/09/2011  . Seizure disorder (HCC) 01/09/2011    Prenatal labs: ABO, Rh: --/--/A POS, A POS (04/12 0700) Antibody: NEG (04/12 0700) Rubella: !Error! RPR: Non Reactive (04/12 0805)  HBsAg: Negative (09/09 0000)  HIV: Non-reactive (09/09 0000)  Genetic testing: Declined.  AFP1 neg. US anato: wnl 3 hr GTT abnormal. GBS: Positive (03/29 0000)   Assessment/Plan: 39 6/7 wks GDMA1 well controled/LGA 92% (AC 98%), Seizure disorder on Trileptal, GBS pos, Vanco.  FHR cat 1.  Expectant management towards probable vaginal delivery.   Reinhart Saulters,MARIE-LYNE 10/05/2015, 5:54 PM

## 2015-10-05 NOTE — Anesthesia Procedure Notes (Signed)
Epidural Patient location during procedure: OB Start time: 10/05/2015 5:52 PM End time: 10/05/2015 5:59 PM  Staffing Anesthesiologist: Shona SimpsonHOLLIS, Karigan Cloninger D Performed by: anesthesiologist   Preanesthetic Checklist Completed: patient identified, site marked, surgical consent, pre-op evaluation, timeout performed, IV checked, risks and benefits discussed and monitors and equipment checked  Epidural Patient position: sitting Prep: ChloraPrep Patient monitoring: heart rate, continuous pulse ox and blood pressure Approach: midline Location: L3-L4 Injection technique: LOR saline  Needle:  Needle type: Tuohy  Needle gauge: 17 G Needle length: 9 cm Catheter type: closed end flexible Catheter size: 20 Guage Test dose: negative and 1.5% lidocaine  Assessment Events: blood not aspirated, injection not painful, no injection resistance and no paresthesia  Additional Notes LOR @ 6  Patient identified. Risks/Benefits/Options discussed with patient including but not limited to bleeding, infection, nerve damage, paralysis, failed block, incomplete pain control, headache, blood pressure changes, nausea, vomiting, reactions to medications, itching and postpartum back pain. Confirmed with bedside nurse the patient's most recent platelet count. Confirmed with patient that they are not currently taking any anticoagulation, have any bleeding history or any family history of bleeding disorders. Patient expressed understanding and wished to proceed. All questions were answered. Sterile technique was used throughout the entire procedure. Please see nursing notes for vital signs. Test dose was given through epidural catheter and negative prior to continuing to dose epidural or start infusion. Warning signs of high block given to the patient including shortness of breath, tingling/numbness in hands, complete motor block, or any concerning symptoms with instructions to call for help. Patient was given instructions on  fall risk and not to get out of bed. All questions and concerns addressed with instructions to call with any issues or inadequate analgesia.    Reason for block:procedure for pain

## 2015-10-06 ENCOUNTER — Encounter (HOSPITAL_COMMUNITY): Payer: Self-pay

## 2015-10-06 LAB — CBC
HCT: 31.9 % — ABNORMAL LOW (ref 36.0–46.0)
HEMOGLOBIN: 10.2 g/dL — AB (ref 12.0–15.0)
MCH: 20.9 pg — ABNORMAL LOW (ref 26.0–34.0)
MCHC: 32 g/dL (ref 30.0–36.0)
MCV: 65.2 fL — ABNORMAL LOW (ref 78.0–100.0)
PLATELETS: 190 10*3/uL (ref 150–400)
RBC: 4.89 MIL/uL (ref 3.87–5.11)
RDW: 15.7 % — ABNORMAL HIGH (ref 11.5–15.5)
WBC: 14.6 10*3/uL — AB (ref 4.0–10.5)

## 2015-10-06 MED ORDER — ONDANSETRON HCL 4 MG/2ML IJ SOLN: 4.0000 mg | INTRAMUSCULAR | Status: DC | PRN

## 2015-10-06 MED ORDER — ZOLPIDEM TARTRATE 5 MG PO TABS: 5.0000 mg | ORAL_TABLET | Freq: Every evening | ORAL | Status: DC | PRN

## 2015-10-06 MED ORDER — OXYTOCIN 10 UNIT/ML IJ SOLN: 2.5000 [IU]/h | INTRAMUSCULAR | Status: DC | PRN

## 2015-10-06 MED ORDER — ONDANSETRON HCL 4 MG PO TABS: 4.0000 mg | ORAL_TABLET | ORAL | Status: DC | PRN

## 2015-10-06 MED ORDER — DIPHENHYDRAMINE HCL 25 MG PO CAPS: 25.0000 mg | ORAL_CAPSULE | Freq: Four times a day (QID) | ORAL | Status: DC | PRN

## 2015-10-06 MED ORDER — OXCARBAZEPINE 300 MG PO TABS
300.0000 mg | ORAL_TABLET | Freq: Two times a day (BID) | ORAL | Status: DC
  Administered 2015-10-06 – 2015-10-07 (×3): 300 mg via ORAL
  Filled 2015-10-06 (×5): qty 1

## 2015-10-06 MED ORDER — BENZOCAINE-MENTHOL 20-0.5 % EX AERO
1.0000 "application " | INHALATION_SPRAY | CUTANEOUS | Status: DC | PRN
  Administered 2015-10-06: 1 via TOPICAL
  Filled 2015-10-06: qty 56

## 2015-10-06 MED ORDER — ACETAMINOPHEN 325 MG PO TABS: 650.0000 mg | ORAL_TABLET | ORAL | Status: DC | PRN

## 2015-10-06 MED ORDER — TETANUS-DIPHTH-ACELL PERTUSSIS 5-2.5-18.5 LF-MCG/0.5 IM SUSP: 0.5000 mL | Freq: Once | INTRAMUSCULAR | Status: DC

## 2015-10-06 MED ORDER — SIMETHICONE 80 MG PO CHEW: 80.0000 mg | CHEWABLE_TABLET | ORAL | Status: DC | PRN

## 2015-10-06 MED ORDER — PRENATAL MULTIVITAMIN CH
1.0000 | ORAL_TABLET | Freq: Every day | ORAL | Status: DC
  Administered 2015-10-06 – 2015-10-07 (×2): 1 via ORAL
  Filled 2015-10-06 (×2): qty 1

## 2015-10-06 MED ORDER — WITCH HAZEL-GLYCERIN EX PADS: 1.0000 "application " | MEDICATED_PAD | CUTANEOUS | Status: DC | PRN

## 2015-10-06 MED ORDER — COCONUT OIL OIL: 1.0000 "application " | TOPICAL_OIL | Status: DC | PRN

## 2015-10-06 MED ORDER — DIBUCAINE 1 % RE OINT: 1.0000 "application " | TOPICAL_OINTMENT | RECTAL | Status: DC | PRN

## 2015-10-06 MED ORDER — SENNOSIDES-DOCUSATE SODIUM 8.6-50 MG PO TABS
2.0000 | ORAL_TABLET | ORAL | Status: DC
  Administered 2015-10-07: 2 via ORAL
  Filled 2015-10-06: qty 2

## 2015-10-06 MED ORDER — IBUPROFEN 600 MG PO TABS
600.0000 mg | ORAL_TABLET | Freq: Four times a day (QID) | ORAL | Status: DC
  Administered 2015-10-06 – 2015-10-07 (×6): 600 mg via ORAL
  Filled 2015-10-06 (×6): qty 1

## 2015-10-06 NOTE — Anesthesia Postprocedure Evaluation (Signed)
Anesthesia Post Note  Patient: Marilyn Black  Procedure(s) Performed: * No procedures listed *  Patient location during evaluation: Mother Baby Anesthesia Type: Epidural Level of consciousness: awake and alert and oriented Pain management: satisfactory to patient Vital Signs Assessment: post-procedure vital signs reviewed and stable Respiratory status: spontaneous breathing and nonlabored ventilation Cardiovascular status: stable Postop Assessment: no headache, no backache, no signs of nausea or vomiting, adequate PO intake and patient able to bend at knees (patient up walking) Anesthetic complications: no    Last Vitals:  Filed Vitals:   10/06/15 0131 10/06/15 0212  BP: 126/73 120/72  Pulse: 93 91  Temp:  36.7 C  Resp: 18 18    Last Pain:  Filed Vitals:   10/06/15 0638  PainSc: 2                  Shayanna Thatch

## 2015-10-06 NOTE — Lactation Note (Signed)
This note was copied from a baby's chart. Lactation Consultation Note  Patient Name: Marilyn Black XBJYN'WToday's Date: 10/06/2015 Reason for consult: Follow-up assessment Mom reports baby has been on the breast for the past 2 hours and still acts hungry. Baby has been spitty today and having gas. LC advised this is baby's time to cluster feed and if he has been gassy the suckling may soothe him. Mom concerned also because she has history of LMS with last baby. She BF, pumped and supplemented using an SNS for 9 months. Mom has been seeing Lovenia KimLinda Donovan during the pregnancy and is taking Fenugreek and [employer] Rue. Attempted hand expression but no colostrum, Mom does report seeing some colostrum with hand expression earlier today. Discussed concerns with Mom and it was decided to supplement baby at breast with formula using 5 fr feeding tube. Baby took 7 ml of Alimentum over about 15 minutes. Still a little fussy but did pass some gas and began to settle down. Advised Mom to keep baby upright for 20-30 minutes due to being spitty today. Supplemental guidelines reviewed with Mom, hand out given. Demonstrated set up/cleaning of 5 fr feeding tube/syringe to supplement. Discussed pumping, Mom not ready at this visit but interested in pumping to help with milk supply. Mom also has Spectra DEBP at home. Took pump and accessories to Mom's room and advised to call RN when she is ready to start pumping. RN advised that Mom will call for her to help with pump.   Maternal Data    Feeding Feeding Type: Formula Length of feed: 20 min  LATCH Score/Interventions Latch: Grasps breast easily, tongue down, lips flanged, rhythmical sucking.  Audible Swallowing: Spontaneous and intermittent (with supplement at breast)  Type of Nipple: Everted at rest and after stimulation  Comfort (Breast/Nipple): Soft / non-tender     Hold (Positioning): Assistance needed to correctly position infant at breast and maintain  latch.  LATCH Score: 9  Lactation Tools Discussed/Used Tools: 41F feeding tube / Syringe;Pump Breast pump type: Double-Electric Breast Pump   Consult Status Consult Status: Follow-up Date: 10/07/15 Follow-up type: In-patient    Alfred LevinsGranger, [name] 10/06/2015, 11:53 PM

## 2015-10-06 NOTE — Lactation Note (Signed)
This note was copied from a baby's chart. Lactation Consultation Note: Experienced BF mom concerned that baby is spitting up. Reviewed this is normal after quick deliveries. Mom reports baby is latching well with no pain   LS 9 by RN.Marland Kitchen.Mom has history of low milk supply and first  baby lost "a lot of weight". Used SNS the first 4 months then supply increased. Nursed that baby for 20 months. [facility] and she is already taking Fenugreek and goats rue. Encouraged to watch for feeding cues and feed whenever she sees them BF brochure given with resources for support after DC. Used our OP services with last baby.Discussed pumping to increase stimulation- mom may want to try later today. No questions at present. To call for assist prn   Patient Name: Marilyn Black Lafoe ZOXWR'UToday's Date: 10/06/2015 Reason for consult: Initial assessment   Maternal Data Formula Feeding for Exclusion: No Does the patient have breastfeeding experience prior to this delivery?: Yes  Feeding   LATCH Score/Interventions   Lactation Tools Discussed/Used     Consult Status Consult Status: Follow-up Date: 10/07/15 Follow-up type: In-patient    Marilyn Black, Marilyn Black 10/06/2015, 3:13 PM

## 2015-10-07 MED ORDER — IBUPROFEN 600 MG PO TABS: 600.0000 mg | ORAL_TABLET | Freq: Four times a day (QID) | ORAL | Status: DC | PRN

## 2015-10-07 MED ORDER — SENNOSIDES-DOCUSATE SODIUM 8.6-50 MG PO TABS: 2.0000 | ORAL_TABLET | ORAL | Status: DC

## 2015-10-07 NOTE — Lactation Note (Signed)
This note was copied from a baby's chart. Lactation Consultation Note  Patient Name: Marilyn Black ZOXWR'UToday's Date: 10/07/2015 Reason for consult: Follow-up assessment Visited with Mom on day of discharge, baby 6135 hrs old.  Mom requesting an SNS to take home as she used one to supplement with her first child (915 yr old) for 6 months.  Mom states he milk "came in" when she returned to work.  Recommended if she is supplementing baby, to pump after breast feedings.  Explained importance of early stimulation to the breast, with regards to maximizing her milk volume.  Mom has a Spectra pump at home.  She has not pumped here in hospital.  Baby latching with 5 fr feeding tube, increasing volume to 10 ml now of formula.  Mom using manual expression prior to latching, but unable to express any colostrum.  Mom states her breasts feel empty.  Mom needed assistance with cross cradle hold, having adequate support for baby at level of breast, using U hold on breast, moving baby to breast, and hand placement on breast.  Baby latched fairly well, tends to be shallow causing some pain.  When supplement is flowing and baby starts sucking and swallowing, able to pull on chin to open her mouth more. Talked about OP follow up.  Mom has a relationship with [facility] whom she saw prenatally,  So she will continue seeing her.  Offered her to call prn, or attend BFS[facility].    Maternal Data    Feeding Feeding Type: Formula  LATCH Score/Interventions Latch: Repeated attempts needed to sustain latch, nipple held in mouth throughout feeding, stimulation needed to elicit sucking reflex. (needs formula supplementation to be effective) Intervention(s): Adjust position;Assist with latch;Breast massage;Breast compression  Audible Swallowing: Spontaneous and intermittent Intervention(s): Skin to skin;Hand expression Intervention(s): Skin to skin;Hand expression;Alternate breast massage  Type of Nipple: Everted at rest and  after stimulation  Comfort (Breast/Nipple): Filling, red/small blisters or bruises, mild/mod discomfort  Problem noted: Mild/Moderate discomfort Interventions (Mild/moderate discomfort): Hand massage;Hand expression  Hold (Positioning): Assistance needed to correctly position infant at breast and maintain latch. Intervention(s): Breastfeeding basics reviewed;Support Pillows;Position options;Skin to skin  LATCH Score: 7  Lactation Tools Discussed/Used Tools: 2F feeding tube / Syringe   Consult Status Consult Status: Complete Date: 10/07/15 Follow-up type: Call as needed    Marilyn Black, Marilyn Black 10/07/2015, 11:54 AM

## 2015-10-07 NOTE — Progress Notes (Signed)
Patient ID: Marilyn Black, female   DOB: 11/20/1980, 35 y.o.   MRN: [id number] PPD # 1  Subjective: Pt reports feeling sore, but well and eager for d/c home / Pain controlled with ibuprofen and rare percocet Tolerating po/ Voiding without problems/ No n/v Bleeding is light/ Newborn info: Girl / Feeding: breast    Objective:  VS: Blood pressure 122/74, pulse 68, temperature 98.2 F (36.8 C), temperature source Oral, resp. rate 20, height 5\' 5"  (1.651 m), weight 108.41 kg (239 lb)    Recent Labs  10/05/15 0805 10/06/15 0613  WBC 9.3 14.6*  HGB 10.8* 10.2*  HCT 33.9* 31.9*  PLT 214 190    Blood type: --/--/A POS Rubella: Immune    Physical Exam:  General:  alert, cooperative and no distress CV: Regular rate and rhythm Resp: clear Abdomen: soft, nontender, normal bowel sounds Uterine Fundus: firm, below umbilicus, nontender Perineum: is normal Lochia: minimal Ext: no edema, redness or tenderness in the calves or thighs    A/P: PPD # 1/ G3P2012/ S/P: SVD Hx GDMA1 / well controlled Seizure disorder, followed closely Doing well and stable for discharge home RX: Ibuprofen 600mg  po Q 6 hrs prn pain #30 Refill x 1 Percocet 5/325 1 to 2 po Q 4 hrs prn pain #12 No refill Colace 100mg  po up to TID prn #30 Ref x 1 WOB/GYN booklet given Routine pp visit in 6wks   Demetrius RevelFISHER,Jericca Russett K, MSN, [facility] 10/07/2015, 9:02 AM

## 2015-10-07 NOTE — Discharge Summary (Signed)
  Obstetric Discharge Summary Reason for Admission: induction of labor for GDMA1, LGA Prenatal Procedures: NST Intrapartum Procedures: spontaneous vaginal delivery Postpartum Procedures: none Complications-Operative and Postpartum: none HEMOGLOBIN  Date Value Ref Range Status  10/06/2015 10.2* 12.0 - 15.0 g/dL Final   HCT  Date Value Ref Range Status  10/06/2015 31.9* 36.0 - 46.0 % Final    Physical Exam:  General: alert, cooperative and no distress Lochia: appropriate Uterine Fundus: firm Incision: N/A DVT Evaluation: No evidence of DVT seen on physical exam.  Discharge Diagnoses: Term Pregnancy-delivered  H/O seizure disorder, stable on trileptal Hx GDMA1, well controlled IUGR  Discharge Information: Date: 10/07/2015 Activity: unrestricted Diet: routine Medications: Ibuprofen, Colace and Trileptal Condition: stable Instructions: refer to practice specific booklet Discharge to: home Follow-up Information    Follow up with LAVOIE,MARIE-LYNE, MD. Schedule an appointment as soon as possible for a visit in 6 weeks.   Specialty:  Obstetrics and Gynecology   Why:  post parturm visit   Contact information:   [address] Severe[address] [place] [id number] [phone]       Newborn Data: Live born female on 10/06/15 Birth Weight: 9 lb 0.3 oz (4091 g) APGAR: 8, 9  Home with mother.    Demetrius RevelFISHER,Quentin Shorey K, MSN, [facility] 10/07/2015, 9:16 AM

## 2015-11-11 ENCOUNTER — Inpatient Hospital Stay (EMERGENCY_DEPARTMENT_HOSPITAL)
Admission: AD | Admit: 2015-11-11 | Discharge: 2015-11-12 | Disposition: A | Discharge status: Home / Self Care | Payer: 59 | Source: Ambulatory Visit | Attending: Obstetrics and Gynecology | Admitting: Obstetrics and Gynecology

## 2015-11-11 ENCOUNTER — Encounter (HOSPITAL_COMMUNITY): Payer: Self-pay | Admitting: *Deleted

## 2015-11-11 DIAGNOSIS — I63212 Cerebral infarction due to unspecified occlusion or stenosis of left vertebral arteries: Secondary | ICD-10-CM | POA: Diagnosis not present

## 2015-11-11 DIAGNOSIS — O9089 Other complications of the puerperium, not elsewhere classified: Secondary | ICD-10-CM

## 2015-11-11 DIAGNOSIS — I7774 Dissection of vertebral artery: Secondary | ICD-10-CM | POA: Diagnosis not present

## 2015-11-11 HISTORY — DX: Thalassemia, unspecified: D56.9

## 2015-11-11 LAB — CBC
HCT: 37.3 % (ref 36.0–46.0)
Hemoglobin: 12.2 g/dL (ref 12.0–15.0)
MCH: 20.7 pg — ABNORMAL LOW (ref 26.0–34.0)
MCHC: 32.7 g/dL (ref 30.0–36.0)
MCV: 63.4 fL — ABNORMAL LOW (ref 78.0–100.0)
Platelets: 213 K/uL (ref 150–400)
RBC: 5.88 MIL/uL — ABNORMAL HIGH (ref 3.87–5.11)
RDW: 14.5 % (ref 11.5–15.5)
WBC: 12.3 K/uL — ABNORMAL HIGH (ref 4.0–10.5)

## 2015-11-11 MED ORDER — SODIUM CHLORIDE 0.9 % IV SOLN
INTRAVENOUS | Status: DC
  Administered 2015-11-11: 23:00:00 via INTRAVENOUS

## 2015-11-11 NOTE — MAU Note (Addendum)
States took nap and when woke up was having heavy vag bleeding. Got up to BR and was very dizzy. Had BM and then vomited. Continues to be very dizzy which thinks is causing n/v. Had vag del 4/13. Has bled since delivery. Is breast feeding. PT came in by EMS. Alert and oriented. Has had headache since awoke from nap. Has seizure disorder but no seizures for yrs. [facility]

## 2015-11-11 NOTE — MAU Note (Signed)
Vertigo alittle better along with nausea. Worse with lying back.

## 2015-11-11 NOTE — MAU Provider Note (Signed)
History     CSN: [id number]  Arrival date and time: 11/11/15 2130   First Provider Initiated Contact with Patient 11/11/15 2256      Chief Complaint  Patient presents with  . Vaginal Bleeding  . Dizziness   HPI Marilyn Black 35 y.o. [id number] 5 weeks postpartum had an episode of bleeding around 830pm tonight. She is experiencing extreme vertigo and nausea. She was brought in by ambulance and received IVF and zofran in transit  Past Medical History  Diagnosis Date  . Nocturnal epilepsy (HCC)   . Thalassemia minor   . Postpartum care following vaginal delivery 01/09/2011  . Seizure disorder (HCC) 01/09/2011  . Gestational diabetes mellitus (GDM), antepartum   . Thalassemia trait   . Gestational diabetes     diet controlled  . Thalassemia     Past Surgical History  Procedure Laterality Date  . Wisdom tooth extraction  2002    Family History  Problem Relation Age of Onset  . Diabetes Mother   . Hypertension Mother   . Cancer Father   . Diabetes Sister   . Mental illness Sister     bipolar  . Cancer Maternal Grandmother   . Diabetes Maternal Grandmother   . Hypertension Maternal Grandmother   . Cancer Maternal Grandfather     Social History  Substance Use Topics  . Smoking status: Former Smoker    Quit date: 07/09/2005  . Smokeless tobacco: Never Used  . Alcohol Use: No    Allergies:  Allergies  Allergen Reactions  . Codeine Other (See Comments)    fever  . Penicillins Rash    Has patient had a PCN reaction causing immediate rash, facial/tongue/throat swelling, SOB or lightheadedness with hypotension: Yes Has patient had a PCN reaction causing severe rash involving mucus membranes or skin necrosis: No Has patient had a PCN reaction that required hospitalization No Has patient had a PCN reaction occurring within the last 10 years: No If all of the above answers are "NO", then may proceed with Cephalosporin use.  . Sulfa Antibiotics Rash  . Erythromycin  Other (See Comments)    unknown    Prescriptions prior to admission  Medication Sig Dispense Refill Last Dose  . Fenugreek 500 MG CAPS Take 500 mg by mouth 3 (three) times daily.   11/11/2015 at Unknown time  . folic acid (FOLVITE) 1 MG tablet Take 4 mg by mouth daily.    11/10/2015 at Unknown time  . Oxcarbazepine (TRILEPTAL) 300 MG tablet Take 300 mg by mouth 2 (two) times daily.    11/11/2015 at Unknown time  . Prenatal Multivit-Min-Fe-FA (PRENATAL VITAMINS PO) Take 1 each by mouth daily.   11/11/2015 at Unknown time  . ibuprofen (ADVIL,MOTRIN) 600 MG tablet Take 1 tablet (600 mg total) by mouth every 6 (six) hours as needed (pain). 30 tablet 0   . senna-docusate (SENOKOT-S) 8.6-50 MG tablet Take 2 tablets by mouth daily. 60 tablet 1    Results for orders placed or performed during the hospital encounter of 11/11/15 (from the past 24 hour(s))  CBC     Status: Abnormal   Collection Time: 11/11/15 10:48 PM  Result Value Ref Range   WBC 12.3 (H) 4.0 - 10.5 K/uL   RBC 5.88 (H) 3.87 - 5.11 MIL/uL   Hemoglobin 12.2 12.0 - 15.0 g/dL   HCT 14.7 82.9 - 56.2 %   MCV 63.4 (L) 78.0 - 100.0 fL   MCH 20.7 (L) 26.0 - 34.0 pg  MCHC 32.7 30.0 - 36.0 g/dL   RDW 16.114.5 09.611.5 - 04.515.5 %   Platelets 213 150 - 400 K/uL   Review of Systems  Constitutional: Negative for fever.  Gastrointestinal: Positive for nausea and vomiting.  Genitourinary:       Heavy vaginal bleeding  Neurological: Positive for dizziness and headaches.  All other systems reviewed and are negative.  Physical Exam   Blood pressure 117/77, pulse 88, temperature 97.3 F (36.3 C), resp. rate 18, height 5\' 5"  (1.651 m), weight 225 lb (102.059 kg), SpO2 100 %, unknown if currently breastfeeding.  Physical Exam  Nursing note and vitals reviewed. Constitutional: She is oriented to person, place, and time. She appears well-developed and well-nourished. No distress.  HENT:  Head: Normocephalic and atraumatic.  Cardiovascular: Normal rate  and regular rhythm.   Respiratory: Effort normal and breath sounds normal. No respiratory distress.  GI: Soft. She exhibits no distension. There is no tenderness.  Genitourinary: Vagina normal.  Spec exam -moderate amount of bleeding in vaginal vault. Small clots noted  Musculoskeletal: Normal range of motion.  Neurological: She is alert and oriented to person, place, and time.  Skin: Skin is warm and dry.  Psychiatric: She has a normal mood and affect. Her behavior is normal. Judgment and thought content normal.    MAU Course  Procedures  MDM  Spoke to Dr Cherly Hensenousins. Pt will be given Methergine. If patient continues to have heavy bleeding she is to call the office on Monday for a follow up appointment. She has been instructed to return to MAU as needed. Assessment and Plan  Vaginal bleeding in Postpartum Period  Methergine 0.2mg  TID X 3 days  Discharge  Clemmons,Lori Grissett 11/11/2015, 11:28 PM

## 2015-11-12 MED ORDER — METHYLERGONOVINE MALEATE 0.2 MG PO TABS
0.2000 mg | ORAL_TABLET | Freq: Once | ORAL | Status: AC
  Administered 2015-11-12: 0.2 mg via ORAL
  Filled 2015-11-12: qty 1

## 2015-11-12 MED ORDER — METHYLERGONOVINE MALEATE 0.2 MG PO TABS: 0.2000 mg | ORAL_TABLET | Freq: Three times a day (TID) | ORAL | Status: DC

## 2015-11-12 MED ORDER — METHYLERGONOVINE MALEATE 0.2 MG PO TABS: 0.2000 mg | ORAL_TABLET | Freq: Three times a day (TID) | ORAL | Status: AC

## 2015-11-12 MED ORDER — ONDANSETRON HCL 4 MG PO TABS
4.0000 mg | ORAL_TABLET | Freq: Once | ORAL | Status: AC
  Administered 2015-11-12: 4 mg via ORAL
  Filled 2015-11-12: qty 1

## 2015-11-12 NOTE — Discharge Instructions (Signed)
Methylergonovine tablets What is this medicine? METHYLERGONOVINE (meth il er goe [employer]) is one of a group of medicines known as ergot alkaloids. It used to prevent or to treat excessive bleeding after child birth. This medicine may be used for other purposes; ask your health care provider or pharmacist if you have questions. What should I tell my health care provider before I take this medicine? They need to know if you have any of these conditions: -high blood pressure -infection -kidney or liver disease -an unusual or allergic reaction to methylergonovine, other medicines, foods, dyes, or preservatives -pregnant or trying to get pregnant -breast-feeding (this medicine may be used with care for up to 7 days without interfering with breast-feeding) How should I use this medicine? Take this medicine by mouth with a glass of water. Follow the directions on the prescription label. Take your doses at regular intervals. Do not take your medicine more often than directed. Do not stop taking except on the advice of your doctor or health care professional. Talk to your pediatrician regarding the use of this medicine in children. Special care may be needed. Overdosage: If you think you have taken too much of this medicine contact a poison control center or emergency room at once. NOTE: This medicine is only for you. Do not share this medicine with others. What if I miss a dose? Do not take the missed dose. Take only the next dose according to your normal schedule. Do not take double or extra doses. What may interact with this medicine? Do not take this medicine with any of the following medications: -certain antibiotics like clarithromycin, erythromycin, or troleandomycin -cocaine -grapefruit juice -imatinib -medicines for colds, flu, or breathing difficulties -medicines for fungal infections like itraconazole, ketoconazole, and voriconazole -medicines used to induce labor -medicines used to  treat migraines like almotriptan, eletriptan, frovatriptan, naratriptan, rizatriptan, sumatriptan, or zolmitriptan -midodrine -nefazodone -other ergot alkaloids like ergotamine, dihydroergotamine, ergonovine, or methysergide -some medicines for high blood pressure or chest pain -some medicines for the treatment of HIV infection or AIDS This medicine may also interact with the following medications: -clotrimazole -fluconazole -fluoxetine -fluvoxamine -zileuton This list may not describe all possible interactions. Give your health care provider a list of all the medicines, herbs, non-prescription drugs, or dietary supplements you use. Also tell them if you smoke, drink alcohol, or use illegal drugs. Some items may interact with your medicine. What should I watch for while using this medicine? Do not use tampons, have sex, or use douches until the bleeding has stopped and your doctor allows return to normal activities. Follow the instructions for your condition. What side effects may I notice from receiving this medicine? Side effects that you should report to your doctor or health care professional as soon as possible: -allergic reactions like skin rash, itching or hives, swelling of the face, lips, or tongue -breathing problems -chest pain or tightness -confusion -fast, slow, or pounding heartbeat -fever or chills -hallucinations -increased bleeding -leg or arm pain or cramps -passing tissue or large clots -seizures -swelling of hands, ankles, or feet -tingling, pain or numbness in feet or hands -unusually weak or tired -vomiting Side effects that usually do not require medical attention (report to your doctor or health care professional if they continue or are bothersome): -change in taste -diarrhea -headache -nausea -stomach cramps -temporary ringing of ears This list may not describe all possible side effects. Call your doctor for medical advice about side effects. You may  report side effects to  FDA at 1-800-FDA-1088. Where should I keep my medicine? Keep out of the reach of children. Store tablets at room temperature below 25 degrees C (77 degrees F). Protect from light. Keep container tightly closed. Throw away any unused medicine after the expiration date. NOTE: This sheet is a summary. It may not cover all possible information. If you have questions about this medicine, talk to your doctor, pharmacist, or health care provider.    2016, [employer]vier/[employer] Standard. (2007-12-18 11:17:29)

## 2015-11-12 NOTE — Progress Notes (Signed)
Lori Clemmons CNM in to discuss d/c plan with pt. Written and verbal d/c instructions given and understanding voiced.

## 2015-11-13 ENCOUNTER — Encounter (HOSPITAL_BASED_OUTPATIENT_CLINIC_OR_DEPARTMENT_OTHER): Payer: Self-pay | Admitting: Emergency Medicine

## 2015-11-13 ENCOUNTER — Emergency Department (HOSPITAL_BASED_OUTPATIENT_CLINIC_OR_DEPARTMENT_OTHER): Payer: 59

## 2015-11-13 ENCOUNTER — Inpatient Hospital Stay (HOSPITAL_BASED_OUTPATIENT_CLINIC_OR_DEPARTMENT_OTHER)
Admission: EM | Admit: 2015-11-13 | Discharge: 2015-11-14 | DRG: 064 | Disposition: A | Discharge status: Home / Self Care | Payer: 59 | Attending: Internal Medicine | Admitting: Internal Medicine

## 2015-11-13 ENCOUNTER — Inpatient Hospital Stay (HOSPITAL_COMMUNITY): Payer: 59

## 2015-11-13 DIAGNOSIS — Z9889 Other specified postprocedural states: Secondary | ICD-10-CM | POA: Diagnosis not present

## 2015-11-13 DIAGNOSIS — Z91018 Allergy to other foods: Secondary | ICD-10-CM | POA: Diagnosis not present

## 2015-11-13 DIAGNOSIS — Z8632 Personal history of gestational diabetes: Secondary | ICD-10-CM

## 2015-11-13 DIAGNOSIS — I639 Cerebral infarction, unspecified: Secondary | ICD-10-CM

## 2015-11-13 DIAGNOSIS — I7774 Dissection of vertebral artery: Secondary | ICD-10-CM

## 2015-11-13 DIAGNOSIS — D563 Thalassemia minor: Secondary | ICD-10-CM | POA: Diagnosis present

## 2015-11-13 DIAGNOSIS — Z833 Family history of diabetes mellitus: Secondary | ICD-10-CM

## 2015-11-13 DIAGNOSIS — Z79899 Other long term (current) drug therapy: Secondary | ICD-10-CM | POA: Diagnosis not present

## 2015-11-13 DIAGNOSIS — G40909 Epilepsy, unspecified, not intractable, without status epilepticus: Secondary | ICD-10-CM

## 2015-11-13 DIAGNOSIS — Z882 Allergy status to sulfonamides status: Secondary | ICD-10-CM

## 2015-11-13 DIAGNOSIS — Z8249 Family history of ischemic heart disease and other diseases of the circulatory system: Secondary | ICD-10-CM

## 2015-11-13 DIAGNOSIS — I63212 Cerebral infarction due to unspecified occlusion or stenosis of left vertebral arteries: Principal | ICD-10-CM | POA: Diagnosis present

## 2015-11-13 DIAGNOSIS — Z818 Family history of other mental and behavioral disorders: Secondary | ICD-10-CM

## 2015-11-13 DIAGNOSIS — Z809 Family history of malignant neoplasm, unspecified: Secondary | ICD-10-CM

## 2015-11-13 DIAGNOSIS — Z886 Allergy status to analgesic agent status: Secondary | ICD-10-CM

## 2015-11-13 DIAGNOSIS — N939 Abnormal uterine and vaginal bleeding, unspecified: Secondary | ICD-10-CM | POA: Diagnosis present

## 2015-11-13 DIAGNOSIS — Z87891 Personal history of nicotine dependence: Secondary | ICD-10-CM

## 2015-11-13 DIAGNOSIS — E785 Hyperlipidemia, unspecified: Secondary | ICD-10-CM | POA: Diagnosis present

## 2015-11-13 DIAGNOSIS — Z880 Allergy status to penicillin: Secondary | ICD-10-CM

## 2015-11-13 DIAGNOSIS — M542 Cervicalgia: Secondary | ICD-10-CM

## 2015-11-13 DIAGNOSIS — I6350 Cerebral infarction due to unspecified occlusion or stenosis of unspecified cerebral artery: Secondary | ICD-10-CM | POA: Diagnosis not present

## 2015-11-13 LAB — MRSA PCR SCREENING: MRSA by PCR: NEGATIVE

## 2015-11-13 LAB — BASIC METABOLIC PANEL
ANION GAP: 6 (ref 5–15)
BUN: 17 mg/dL (ref 6–20)
CALCIUM: 9.3 mg/dL (ref 8.9–10.3)
CO2: 26 mmol/L (ref 22–32)
Chloride: 106 mmol/L (ref 101–111)
Creatinine, Ser: 0.61 mg/dL (ref 0.44–1.00)
Glucose, Bld: 113 mg/dL — ABNORMAL HIGH (ref 65–99)
Potassium: 4 mmol/L (ref 3.5–5.1)
Sodium: 138 mmol/L (ref 135–145)

## 2015-11-13 MED ORDER — STROKE: EARLY STAGES OF RECOVERY BOOK
Freq: Once | Status: AC
  Administered 2015-11-13: 18:00:00
  Filled 2015-11-13: qty 1

## 2015-11-13 MED ORDER — METHYLERGONOVINE MALEATE 0.2 MG PO TABS: 0.2000 mg | ORAL_TABLET | Freq: Three times a day (TID) | ORAL | Status: DC

## 2015-11-13 MED ORDER — ASPIRIN EC 81 MG PO TBEC
81.0000 mg | DELAYED_RELEASE_TABLET | Freq: Every day | ORAL | Status: DC
  Administered 2015-11-14: 81 mg via ORAL
  Filled 2015-11-13: qty 1

## 2015-11-13 MED ORDER — MECLIZINE HCL 25 MG PO TABS
25.0000 mg | ORAL_TABLET | Freq: Once | ORAL | Status: AC
  Administered 2015-11-13: 25 mg via ORAL
  Filled 2015-11-13: qty 1

## 2015-11-13 MED ORDER — LORAZEPAM 2 MG/ML IJ SOLN
1.0000 mg | Freq: Once | INTRAMUSCULAR | Status: AC
  Administered 2015-11-14: 1 mg via INTRAVENOUS
  Filled 2015-11-13: qty 1

## 2015-11-13 MED ORDER — OXCARBAZEPINE 300 MG PO TABS
300.0000 mg | ORAL_TABLET | Freq: Two times a day (BID) | ORAL | Status: DC
  Administered 2015-11-13 – 2015-11-14 (×2): 300 mg via ORAL
  Filled 2015-11-13 (×3): qty 1

## 2015-11-13 MED ORDER — DIPHENHYDRAMINE HCL 50 MG/ML IJ SOLN: 25.0000 mg | Freq: Once | INTRAMUSCULAR | Status: DC

## 2015-11-13 MED ORDER — ONDANSETRON HCL 4 MG/2ML IJ SOLN
4.0000 mg | Freq: Once | INTRAMUSCULAR | Status: AC
  Administered 2015-11-13: 4 mg via INTRAVENOUS
  Filled 2015-11-13: qty 2

## 2015-11-13 MED ORDER — DIPHENHYDRAMINE HCL 50 MG/ML IJ SOLN
12.5000 mg | Freq: Once | INTRAMUSCULAR | Status: AC
  Administered 2015-11-13: 12.5 mg via INTRAVENOUS
  Filled 2015-11-13: qty 1

## 2015-11-13 MED ORDER — ASPIRIN 81 MG PO CHEW
324.0000 mg | CHEWABLE_TABLET | Freq: Once | ORAL | Status: AC
  Administered 2015-11-13: 324 mg via ORAL
  Filled 2015-11-13: qty 4

## 2015-11-13 MED ORDER — IOPAMIDOL (ISOVUE-370) INJECTION 76%
100.0000 mL | Freq: Once | INTRAVENOUS | Status: AC | PRN
  Administered 2015-11-13: 100 mL via INTRAVENOUS

## 2015-11-13 MED ORDER — HYDRALAZINE HCL 20 MG/ML IJ SOLN: 5.0000 mg | Freq: Four times a day (QID) | INTRAMUSCULAR | Status: DC | PRN

## 2015-11-13 NOTE — ED Notes (Signed)
Pt reports transient episode of vertigo that lasted approx 1 minute with no other associated symptoms. Vital signs WNL.

## 2015-11-13 NOTE — ED Notes (Signed)
[facility] here to pick up pt.

## 2015-11-13 NOTE — ED Notes (Signed)
Pt delivered baby 4/13. Pt reports dizziness intermittently since giving birth. [facility] Pt received methergine at that visit. Pt denies N/v today.

## 2015-11-13 NOTE — ED Provider Notes (Signed)
CSN: [id number]     Arrival date & time 11/13/15  1019 History   First MD Initiated Contact with Patient 11/13/15 1044     Chief Complaint  Patient presents with  . Dizziness      HPI  Vision presents for evaluation of dizziness. States that Friday night she had an episode of vertigo she was laying on her right side and sat up. She was going to breast-feed her baby. States she was so dizzy she had to have her husband hold the baby. This is been present intermittently since that time. At times she'll feel normal and while I would as well as severe vertigo. She has a mild left-sided headache. And then taken to a review of systems reports a little bit of left-sided neck pain. No fall no injury no trauma. She is 5 weeks postpartum from vaginal delivery. Was seen Friday for heavy vaginal bleeding and was placed on Methergine at the time.  Had normal hemoglobin at that visit.  Past Medical History  Diagnosis Date  . Nocturnal epilepsy (HCC)   . Thalassemia minor   . Postpartum care following vaginal delivery 01/09/2011  . Seizure disorder (HCC) 01/09/2011  . Gestational diabetes mellitus (GDM), antepartum   . Thalassemia trait   . Gestational diabetes     diet controlled  . Thalassemia    Past Surgical History  Procedure Laterality Date  . Wisdom tooth extraction  2002   Family History  Problem Relation Age of Onset  . Diabetes Mother   . Hypertension Mother   . Cancer Father   . Diabetes Sister   . Mental illness Sister     bipolar  . Cancer Maternal Grandmother   . Diabetes Maternal Grandmother   . Hypertension Maternal Grandmother   . Cancer Maternal Grandfather    Social History  Substance Use Topics  . Smoking status: Former Smoker    Quit date: 07/09/2005  . Smokeless tobacco: Never Used  . Alcohol Use: No   OB History    Gravida Para Term Preterm AB TAB SAB Ectopic Multiple Living   3 2 2  1  1   0 2     Review of Systems  Constitutional: Negative for fever,  chills, diaphoresis, appetite change and fatigue.  HENT: Negative for mouth sores, sore throat and trouble swallowing.   Eyes: Negative for visual disturbance.  Respiratory: Negative for cough, chest tightness, shortness of breath and wheezing.   Cardiovascular: Negative for chest pain.  Gastrointestinal: Positive for nausea. Negative for vomiting, abdominal pain, diarrhea and abdominal distention.  Endocrine: Negative for polydipsia, polyphagia and polyuria.  Genitourinary: Negative for dysuria, frequency and hematuria.  Musculoskeletal: Positive for neck pain. Negative for gait problem.  Skin: Negative for color change, pallor and rash.  Neurological: Positive for dizziness and headaches. Negative for syncope and light-headedness.  Hematological: Does not bruise/bleed easily.  Psychiatric/Behavioral: Negative for behavioral problems and confusion.      Allergies  Codeine; Penicillins; Sulfa antibiotics; and Erythromycin  Home Medications   Prior to Admission medications   Medication Sig Start Date End Date Taking? Authorizing Provider  Fenugreek 500 MG CAPS Take 500 mg by mouth 3 (three) times daily.    Historical Provider, MD  ibuprofen (ADVIL,MOTRIN) 600 MG tablet Take 1 tablet (600 mg total) by mouth every 6 (six) hours as needed (pain). 10/07/15   Arlana Lindau, NP  methylergonovine (METHERGINE) 0.2 MG tablet Take 1 tablet (0.2 mg total) by mouth 3 (three) times daily. 11/12/15  11/15/15  Lori A Clemmons, CNM  Oxcarbazepine (TRILEPTAL) 300 MG tablet Take 300 mg by mouth 2 (two) times daily.     Historical Provider, MD  Prenatal Multivit-Min-Fe-FA (PRENATAL VITAMINS PO) Take 1 each by mouth daily.    Historical Provider, MD  senna-docusate (SENOKOT-S) 8.6-50 MG tablet Take 2 tablets by mouth daily. 10/07/15   Arlana Lindau, NP   BP 103/61 mmHg  Pulse 71  Temp(Src) 98 F (36.7 C) (Oral)  Resp 15  Ht 5\' 5"  (1.651 m)  Wt 227 lb (102.967 kg)  BMI 37.77 kg/m2  SpO2 99%   Breastfeeding? Yes Physical Exam  Constitutional: She is oriented to person, place, and time. She appears well-developed and well-nourished. No distress.  HENT:  Head: Normocephalic.  Eyes: Conjunctivae are normal. Pupils are equal, round, and reactive to light. No scleral icterus.  Neck: Normal range of motion. Neck supple. No thyromegaly present.  Cardiovascular: Normal rate and regular rhythm.  Exam reveals no gallop and no friction rub.   No murmur heard. Pulmonary/Chest: Effort normal and breath sounds normal. No respiratory distress. She has no wheezes. She has no rales.  Abdominal: Soft. Bowel sounds are normal. She exhibits no distension. There is no tenderness. There is no rebound.  Musculoskeletal: Normal range of motion.  Neurological: She is alert and oriented to person, place, and time. She has normal strength. No cranial nerve deficit or sensory deficit.  Normal symmetric cranial nerves. No nystagmus. No extra ocular movement palsy. No neck bruits. Gait not tested  Skin: Skin is warm and dry. No rash noted.  Psychiatric: She has a normal mood and affect. Her behavior is normal.    ED Course  Procedures (including critical care time) Labs Review Labs Reviewed  BASIC METABOLIC PANEL - Abnormal; Notable for the following:    Glucose, Bld 113 (*)    All other components within normal limits    Imaging Review Ct Angio Head W/cm &/or Wo Cm  11/13/2015  ADDENDUM REPORT: 11/13/2015 13:32 ADDENDUM: These results were called by telephone at the time of interpretation on 11/13/2015 at 1:32 pm to Dr. Rolland Porter , who verbally acknowledged these results. Electronically Signed   By: Marin Roberts M.D.   On: 11/13/2015 13:32  11/13/2015  CLINICAL DATA:  Six weeks postpartum with occipital occipital headache. Left posterior neck pain and vertigo. Question left vertebral artery dissection EXAM: CT ANGIOGRAPHY HEAD AND NECK TECHNIQUE: Multidetector CT imaging of the head and neck was  performed using the standard protocol during bolus administration of intravenous contrast. Multiplanar CT image reconstructions and MIPs were obtained to evaluate the vascular anatomy. Carotid stenosis measurements (when applicable) are obtained utilizing NASCET criteria, using the distal internal carotid diameter as the denominator. CONTRAST:  100 mL Isovue 370 COMPARISON:  CT head without contrast 02/11/2007 FINDINGS: CT HEAD Brain: Areas of hypoattenuation are present along the inferior aspect of the left cerebellum concerning for focal infarcts. There is no hemorrhage. No other focal cortical infarcts are present. The basal ganglia are intact. The insular ribbon is normal. The ventricles are of normal size. No significant extra-axial fluid collection is present. Calvarium and skull base: Within normal limits. Paranasal sinuses: Clear Orbits: Unremarkable CTA NECK Aortic arch: A 3 vessel arch configuration is present. There is no significant stenosis or calcification at the arch. Right carotid system: The right common carotid artery is within normal limits. The bifurcation is unremarkable. Cervical right ICA is normal. Left carotid system: The left common carotid artery is  within normal limits. The bifurcation is unremarkable. Cervical left ICA is normal. Vertebral arteries:The vertebral arteries both originate from the subclavian arteries. The left vertebral artery appears to be dominant at the origins. There is significant attenuation of the left vertebral artery at the C5-6 level on the left. The left vertebral artery lumen is narrowed throughout the left neck. There is a discrete narrowing at the C1 level on the left as well. The left vertebral artery is of more normal caliber above the C1 level. The right vertebral artery is patent throughout the neck. There is no focal stenosis. Skeleton: Bone windows are unremarkable. Vertebral body heights alignment are maintained. No focal lytic or blastic lesions are  present. Other neck: The soft tissues of the neck are otherwise unremarkable. No focal mucosal or submucosal lesions are present. CTA HEAD Anterior circulation: The internal carotid arteries are within normal limits from the skullbase through the ICA terminus. The A1 and M1 segments are normal. The anterior communicating artery is patent. The MCA bifurcations are intact. ACA and MCA branch vessels are normal. Posterior circulation: There is mild narrowing of the left vertebral artery above the dural margin. The vessel is reconstituted 2 normal volume below the dural margin on the left at the C1 level. The vertebrobasilar junction is intact. PICA origins are visualized and normal bilaterally. The basilar artery is normal. Both posterior cerebral arteries originate from the basilar tip. The PCA branch vessels are intact. Venous sinuses: The dural sinuses are patent. The straight sinus and deep cerebral veins are intact. Cortical veins are within normal limits. Anatomic variants: None. Delayed phase: The postcontrast images re- demonstrate areas of suspected infarction in the inferior left cerebellum. No other focal cortical infarcts are present. Linear enhancement in the right parietal lobe likely represents a developmental venous anomaly. IMPRESSION: 1. Areas of tandem discrete narrowing of the left vertebral artery concerning for the left vertebral artery dissection. Discrete narrowing is present at the C5-6 level and fifth C1 level on the left. 2. 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 3. The anterior circulation and right vertebral artery are normal. 4. Circle of Willis is unremarkable. No significant proximal stenosis, aneurysm, or branch vessel occlusion. Electronically Signed: By: Marin Robertshristopher  Mattern M.D. On: 11/13/2015 13:18   Ct Angio Neck W/cm &/or Wo/cm  11/13/2015  ADDENDUM REPORT: 11/13/2015 13:32 ADDENDUM: These results were called by telephone at the time  of interpretation on 11/13/2015 at 1:32 pm to Dr. Rolland PorterMARK Sacha Topor , who verbally acknowledged these results. Electronically Signed   By: Marin Robertshristopher  Mattern M.D.   On: 11/13/2015 13:32  11/13/2015  CLINICAL DATA:  Six weeks postpartum with occipital occipital headache. Left posterior neck pain and vertigo. Question left vertebral artery dissection EXAM: CT ANGIOGRAPHY HEAD AND NECK TECHNIQUE: Multidetector CT imaging of the head and neck was performed using the standard protocol during bolus administration of intravenous contrast. Multiplanar CT image reconstructions and MIPs were obtained to evaluate the vascular anatomy. Carotid stenosis measurements (when applicable) are obtained utilizing NASCET criteria, using the distal internal carotid diameter as the denominator. CONTRAST:  100 mL Isovue 370 COMPARISON:  CT head without contrast 02/11/2007 FINDINGS: CT HEAD Brain: Areas of hypoattenuation are present along the inferior aspect of the left cerebellum concerning for focal infarcts. There is no hemorrhage. No other focal cortical infarcts are present. The basal ganglia are intact. The insular ribbon is normal. The ventricles are of normal size. No significant extra-axial fluid collection is present. Calvarium  and skull base: Within normal limits. Paranasal sinuses: Clear Orbits: Unremarkable CTA NECK Aortic arch: A 3 vessel arch configuration is present. There is no significant stenosis or calcification at the arch. Right carotid system: The right common carotid artery is within normal limits. The bifurcation is unremarkable. Cervical right ICA is normal. Left carotid system: The left common carotid artery is within normal limits. The bifurcation is unremarkable. Cervical left ICA is normal. Vertebral arteries:The vertebral arteries both originate from the subclavian arteries. The left vertebral artery appears to be dominant at the origins. There is significant attenuation of the left vertebral artery at the C5-6  level on the left. The left vertebral artery lumen is narrowed throughout the left neck. There is a discrete narrowing at the C1 level on the left as well. The left vertebral artery is of more normal caliber above the C1 level. The right vertebral artery is patent throughout the neck. There is no focal stenosis. Skeleton: Bone windows are unremarkable. Vertebral body heights alignment are maintained. No focal lytic or blastic lesions are present. Other neck: The soft tissues of the neck are otherwise unremarkable. No focal mucosal or submucosal lesions are present. CTA HEAD Anterior circulation: The internal carotid arteries are within normal limits from the skullbase through the ICA terminus. The A1 and M1 segments are normal. The anterior communicating artery is patent. The MCA bifurcations are intact. ACA and MCA branch vessels are normal. Posterior circulation: There is mild narrowing of the left vertebral artery above the dural margin. The vessel is reconstituted 2 normal volume below the dural margin on the left at the C1 level. The vertebrobasilar junction is intact. PICA origins are visualized and normal bilaterally. The basilar artery is normal. Both posterior cerebral arteries originate from the basilar tip. The PCA branch vessels are intact. Venous sinuses: The dural sinuses are patent. The straight sinus and deep cerebral veins are intact. Cortical veins are within normal limits. Anatomic variants: None. Delayed phase: The postcontrast images re- demonstrate areas of suspected infarction in the inferior left cerebellum. No other focal cortical infarcts are present. Linear enhancement in the right parietal lobe likely represents a developmental venous anomaly. IMPRESSION: 1. Areas of tandem discrete narrowing of the left vertebral artery concerning for the left vertebral artery dissection. Discrete narrowing is present at the C5-6 level and fifth C1 level on the left. 2. Nonhemorrhagic infarcts at the  inferior aspect of the left cerebellar hemisphere. 3. The anterior circulation and right vertebral artery are normal. 4. Circle of Willis is unremarkable. No significant proximal stenosis, aneurysm, or branch vessel occlusion. Electronically Signed: By: Marin Roberts M.D. On: 11/13/2015 13:18   I have personally reviewed and evaluated these images and lab results as part of my medical decision-making.   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 I discussed this at length with the patient. Also discussed this with Dr. Lavon Paganini of neurology. [facility] Given aspirin here. I am aware that she is to having some poor pleural bleeding. However I think the risk of continued or worsening bleeding with aspirin is minimal versus the need for anticoagulation. I discussed this with her at length and patient agrees. Also discussed with  Dr. Selena Batten. Patient will be transferred to [facility].    Rolland Porter, MD 11/13/15 [phone]

## 2015-11-13 NOTE — Progress Notes (Signed)
Report received awaiting pts arrival  

## 2015-11-13 NOTE — H&P (Signed)
TRH H&P   Patient Demographics:    Marilyn Black, is a 35 y.o. female  MRN: [id number]   DOB - May 15, 1981  Admit Date - 11/13/2015  Outpatient Primary MD for the patient is Mickie Hillier, MD  Referring MD/NP/PA: Rolland Porter  Outpatient Specialists:   Patient coming from: home  Chief Complaint  Patient presents with  . Dizziness      HPI:    Marilyn Black  is a 35 y.o. female, 5 weeks postpartum, seizure do, who apparently c/o dizziness.  Pt has had vertigo for the past 2 days.  Pt also has had some occipital headache as well as neck pain. Pt also notes some perioral numbness, tingling.  Pt denies any vision change.  Pt denies focal weakness, cp, palp, sob.  Pt presented to ED and found to have cerebellar infarct and also vertebral dissection     Review of systems:    In addition to the HPI above,  No Fever-chills, No changes with Vision or hearing, No problems swallowing food or Liquids, No Chest pain, Cough or Shortness of Breath, No Abdominal pain, No Nausea or Vommitting, Bowel movements are regular, No Blood in stool or Urine, No dysuria, No new skin rashes or bruises, No new joints pains-aches,  No new weakness,in any extremity, No recent weight gain or loss, No polyuria, polydypsia or polyphagia, No significant Mental Stressors.  A full 10 point Review of Systems was done, except as stated above, all other Review of Systems were negative.   With Past History of the following :    Past Medical History  Diagnosis Date  . Nocturnal epilepsy (HCC)   . Thalassemia minor   . Postpartum care following vaginal delivery 01/09/2011  . Seizure disorder (HCC) 01/09/2011  . Gestational diabetes mellitus (GDM), antepartum   . Thalassemia trait   . Gestational diabetes     diet controlled  . Thalassemia       Past Surgical History  Procedure Laterality Date   . Wisdom tooth extraction  2002      Social History:     Social History  Substance Use Topics  . Smoking status: Former Smoker    Quit date: 07/09/2005  . Smokeless tobacco: Never Used  . Alcohol Use: No     Lives - at home  Mobility - ambulatory   Family History :     Family History  Problem Relation Age of Onset  . Diabetes Mother   . Hypertension Mother   . Cancer Father   . Diabetes Sister   . Mental illness Sister     bipolar  . Cancer Maternal Grandmother   . Diabetes Maternal Grandmother   . Hypertension Maternal Grandmother   . Cancer Maternal Grandfather       Home Medications:   Prior to Admission medications   Medication Sig Start Date End Date Taking? Authorizing  Provider  [place] 500 MG CAPS Take 500 mg by mouth 3 (three) times daily.    Historical Provider, MD  ibuprofen (ADVIL,MOTRIN) 600 MG tablet Take 1 tablet (600 mg total) by mouth every 6 (six) hours as needed (pain). 10/07/15   Arlana Lindau, NP  methylergonovine (METHERGINE) 0.2 MG tablet Take 1 tablet (0.2 mg total) by mouth 3 (three) times daily. 11/12/15 11/15/15  Lori A Clemmons, CNM  Oxcarbazepine (TRILEPTAL) 300 MG tablet Take 300 mg by mouth 2 (two) times daily.     Historical Provider, MD  Prenatal Multivit-Min-Fe-FA (PRENATAL VITAMINS PO) Take 1 each by mouth daily.    Historical Provider, MD  senna-docusate (SENOKOT-S) 8.6-50 MG tablet Take 2 tablets by mouth daily. 10/07/15   Arlana Lindau, NP     Allergies:     Allergies  Allergen Reactions  . Codeine Other (See Comments)    fever  . Penicillins Rash    Has patient had a PCN reaction causing immediate rash, facial/tongue/throat swelling, SOB or lightheadedness with hypotension: Yes Has patient had a PCN reaction causing severe rash involving mucus membranes or skin necrosis: No Has patient had a PCN reaction that required hospitalization No Has patient had a PCN reaction occurring within the last 10 years: No If all of the  above answers are "NO", then may proceed with Cephalosporin use.  . Sulfa Antibiotics Rash  . Erythromycin Other (See Comments)    unknown     Physical Exam:   Vitals  Blood pressure 119/70, pulse 89, temperature 98.1 F (36.7 C), temperature source Oral, resp. rate 24, height 5\' 5"  (1.651 m), weight 101.6 kg (223 lb 15.8 oz), SpO2 99 %, currently breastfeeding.   1. General young white female  lying in bed in NAD,   2. Normal affect and insight, Not Suicidal or Homicidal, Awake Alert, Oriented X 3.  3. No F.N deficits, ALL C.Nerves Intact, Strength 5/5 all 4 extremities, Sensation intact all 4 extremities, Plantars down going.  Good finger to nose.   4. Ears and Eyes appear Normal, Conjunctivae clear, PERRLA. Moist Oral Mucosa.  5. Supple Neck, No JVD, No cervical lymphadenopathy appriciated, No Carotid Bruits.  6. Symmetrical Chest wall movement, Good air movement bilaterally, CTAB.  7. RRR, No Gallops, Rubs or Murmurs, No Parasternal Heave.  8. Positive Bowel Sounds, Abdomen Soft, No tenderness, No organomegaly appriciated,No rebound -guarding or rigidity.  9.  No Cyanosis, Normal Skin Turgor, No Skin Rash or Bruise.  10. Good muscle tone,  joints appear normal , no effusions, Normal ROM.  11. No Palpable Lymph Nodes in Neck or Axillae    Data Review:    CBC  Recent Labs Lab 11/11/15 2248  WBC 12.3*  HGB 12.2  HCT 37.3  PLT 213  MCV 63.4*  MCH 20.7*  MCHC 32.7  RDW 14.5   ------------------------------------------------------------------------------------------------------------------  Chemistries   Recent Labs Lab 11/13/15 1130  NA 138  K 4.0  CL 106  CO2 26  GLUCOSE 113*  BUN 17  CREATININE 0.61  CALCIUM 9.3   ------------------------------------------------------------------------------------------------------------------ estimated creatinine clearance is 118.1 mL/min (by C-G formula based on Cr of  0.61). ------------------------------------------------------------------------------------------------------------------ No results for input(s): TSH, T4TOTAL, T3FREE, THYROIDAB in the last 72 hours.  Invalid input(s): FREET3  Coagulation profile No results for input(s): INR, PROTIME in the last 168 hours. ------------------------------------------------------------------------------------------------------------------- No results for input(s): DDIMER in the last 72 hours. -------------------------------------------------------------------------------------------------------------------  Cardiac Enzymes No results for input(s): CKMB, TROPONINI, MYOGLOBIN in the last 168 hours.  Invalid input(s): CK ------------------------------------------------------------------------------------------------------------------ No results found for: BNP   ---------------------------------------------------------------------------------------------------------------  Urinalysis    Component Value Date/Time   COLORURINE YELLOW 05/02/2010 2032   APPEARANCEUR CLEAR 05/02/2010 2032   LABSPEC 1.024 05/02/2010 2032   PHURINE 5.5 05/02/2010 2032   GLUCOSEU NEGATIVE 05/02/2010 2032   HGBUR NEGATIVE 05/02/2010 2032   BILIRUBINUR NEGATIVE 05/02/2010 2032   KETONESUR NEGATIVE 05/02/2010 2032   PROTEINUR NEGATIVE 05/02/2010 2032   UROBILINOGEN 0.2 05/02/2010 2032   NITRITE NEGATIVE 05/02/2010 2032   LEUKOCYTESUR  05/02/2010 2032    NEGATIVE MICROSCOPIC NOT DONE ON URINES WITH NEGATIVE PROTEIN, BLOOD, LEUKOCYTES, NITRITE, OR GLUCOSE <1000 mg/dL.    ----------------------------------------------------------------------------------------------------------------   Imaging Results:    Ct Angio Head W/cm &/or Wo Cm  11/13/2015  ADDENDUM REPORT: 11/13/2015 13:32 ADDENDUM: These results were called by telephone at the time of interpretation on 11/13/2015 at 1:32 pm to Dr. Rolland Porter , who verbally  acknowledged these results. Electronically Signed   By: Marin Roberts M.D.   On: 11/13/2015 13:32  11/13/2015  CLINICAL DATA:  Six weeks postpartum with occipital occipital headache. Left posterior neck pain and vertigo. Question left vertebral artery dissection EXAM: CT ANGIOGRAPHY HEAD AND NECK TECHNIQUE: Multidetector CT imaging of the head and neck was performed using the standard protocol during bolus administration of intravenous contrast. Multiplanar CT image reconstructions and MIPs were obtained to evaluate the vascular anatomy. Carotid stenosis measurements (when applicable) are obtained utilizing NASCET criteria, using the distal internal carotid diameter as the denominator. CONTRAST:  100 mL Isovue 370 COMPARISON:  CT head without contrast 02/11/2007 FINDINGS: CT HEAD Brain: Areas of hypoattenuation are present along the inferior aspect of the left cerebellum concerning for focal infarcts. There is no hemorrhage. No other focal cortical infarcts are present. The basal ganglia are intact. The insular ribbon is normal. The ventricles are of normal size. No significant extra-axial fluid collection is present. Calvarium and skull base: Within normal limits. Paranasal sinuses: Clear Orbits: Unremarkable CTA NECK Aortic arch: A 3 vessel arch configuration is present. There is no significant stenosis or calcification at the arch. Right carotid system: The right common carotid artery is within normal limits. The bifurcation is unremarkable. Cervical right ICA is normal. Left carotid system: The left common carotid artery is within normal limits. The bifurcation is unremarkable. Cervical left ICA is normal. Vertebral arteries:The vertebral arteries both originate from the subclavian arteries. The left vertebral artery appears to be dominant at the origins. There is significant attenuation of the left vertebral artery at the C5-6 level on the left. The left vertebral artery lumen is narrowed throughout the  left neck. There is a discrete narrowing at the C1 level on the left as well. The left vertebral artery is of more normal caliber above the C1 level. The right vertebral artery is patent throughout the neck. There is no focal stenosis. Skeleton: Bone windows are unremarkable. Vertebral body heights alignment are maintained. No focal lytic or blastic lesions are present. Other neck: The soft tissues of the neck are otherwise unremarkable. No focal mucosal or submucosal lesions are present. CTA HEAD Anterior circulation: The internal carotid arteries are within normal limits from the skullbase through the ICA terminus. The A1 and M1 segments are normal. The anterior communicating artery is patent. The MCA bifurcations are intact. ACA and MCA branch vessels are normal. Posterior circulation: There is mild narrowing of the left vertebral artery above the dural margin. The vessel is reconstituted 2 normal volume below the  dural margin on the left at the C1 level. The vertebrobasilar junction is intact. PICA origins are visualized and normal bilaterally. The basilar artery is normal. Both posterior cerebral arteries originate from the basilar tip. The PCA branch vessels are intact. Venous sinuses: The dural sinuses are patent. The straight sinus and deep cerebral veins are intact. Cortical veins are within normal limits. Anatomic variants: None. Delayed phase: The postcontrast images re- demonstrate areas of suspected infarction in the inferior left cerebellum. No other focal cortical infarcts are present. Linear enhancement in the right parietal lobe likely represents a developmental venous anomaly. IMPRESSION: 1. Areas of tandem discrete narrowing of the left vertebral artery concerning for the left vertebral artery dissection. Discrete narrowing is present at the C5-6 level and fifth C1 level on the left. 2. 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 3. The anterior circulation  and right vertebral artery are normal. 4. Circle of Willis is unremarkable. No significant proximal stenosis, aneurysm, or branch vessel occlusion. Electronically Signed: By: Marin Roberts M.D. On: 11/13/2015 13:18   Ct Angio Neck W/cm &/or Wo/cm  11/13/2015  ADDENDUM REPORT: 11/13/2015 13:32 ADDENDUM: These results were called by telephone at the time of interpretation on 11/13/2015 at 1:32 pm to Dr. Rolland Porter , who verbally acknowledged these results. Electronically Signed   By: Marin Roberts M.D.   On: 11/13/2015 13:32  11/13/2015  CLINICAL DATA:  Six weeks postpartum with occipital occipital headache. Left posterior neck pain and vertigo. Question left vertebral artery dissection EXAM: CT ANGIOGRAPHY HEAD AND NECK TECHNIQUE: Multidetector CT imaging of the head and neck was performed using the standard protocol during bolus administration of intravenous contrast. Multiplanar CT image reconstructions and MIPs were obtained to evaluate the vascular anatomy. Carotid stenosis measurements (when applicable) are obtained utilizing NASCET criteria, using the distal internal carotid diameter as the denominator. CONTRAST:  100 mL Isovue 370 COMPARISON:  CT head without contrast 02/11/2007 FINDINGS: CT HEAD Brain: Areas of hypoattenuation are present along the inferior aspect of the left cerebellum concerning for focal infarcts. There is no hemorrhage. No other focal cortical infarcts are present. The basal ganglia are intact. The insular ribbon is normal. The ventricles are of normal size. No significant extra-axial fluid collection is present. Calvarium and skull base: Within normal limits. Paranasal sinuses: Clear Orbits: Unremarkable CTA NECK Aortic arch: A 3 vessel arch configuration is present. There is no significant stenosis or calcification at the arch. Right carotid system: The right common carotid artery is within normal limits. The bifurcation is unremarkable. Cervical right ICA is normal.  Left carotid system: The left common carotid artery is within normal limits. The bifurcation is unremarkable. Cervical left ICA is normal. Vertebral arteries:The vertebral arteries both originate from the subclavian arteries. The left vertebral artery appears to be dominant at the origins. There is significant attenuation of the left vertebral artery at the C5-6 level on the left. The left vertebral artery lumen is narrowed throughout the left neck. There is a discrete narrowing at the C1 level on the left as well. The left vertebral artery is of more normal caliber above the C1 level. The right vertebral artery is patent throughout the neck. There is no focal stenosis. Skeleton: Bone windows are unremarkable. Vertebral body heights alignment are maintained. No focal lytic or blastic lesions are present. Other neck: The soft tissues of the neck are otherwise unremarkable. No focal mucosal or submucosal lesions are present. CTA HEAD Anterior circulation: The internal  carotid arteries are within normal limits from the skullbase through the ICA terminus. The A1 and M1 segments are normal. The anterior communicating artery is patent. The MCA bifurcations are intact. ACA and MCA branch vessels are normal. Posterior circulation: There is mild narrowing of the left vertebral artery above the dural margin. The vessel is reconstituted 2 normal volume below the dural margin on the left at the C1 level. The vertebrobasilar junction is intact. PICA origins are visualized and normal bilaterally. The basilar artery is normal. Both posterior cerebral arteries originate from the basilar tip. The PCA branch vessels are intact. Venous sinuses: The dural sinuses are patent. The straight sinus and deep cerebral veins are intact. Cortical veins are within normal limits. Anatomic variants: None. Delayed phase: The postcontrast images re- demonstrate areas of suspected infarction in the inferior left cerebellum. No other focal cortical  infarcts are present. Linear enhancement in the right parietal lobe likely represents a developmental venous anomaly. IMPRESSION: 1. Areas of tandem discrete narrowing of the left vertebral artery concerning for the left vertebral artery dissection. Discrete narrowing is present at the C5-6 level and fifth C1 level on the left. 2. 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 3. The anterior circulation and right vertebral artery are normal. 4. Circle of Willis is unremarkable. No significant proximal stenosis, aneurysm, or branch vessel occlusion. Electronically Signed: By: Marin Robertshristopher  Mattern M.D. On: 11/13/2015 13:18      Assessment & Plan:    Active Problems:   CVA (cerebral infarction)   Vertebral artery dissection (HCC)   Neck pain    1. CVA MRI Brain w/o contrast Cardiac echo Aspirin 81mg  po qday Appreciate neurology input  2.  Vertebral artery dissection Will keep bp under <140/90 Hydralazine 10mg  po q6h prn sbp >140  3.  Seizure disorder Cont current medications  4.  Neck pain Per neurology MRA not needed MRI c spine   DVT Prophylaxis SCDs  AM Labs Ordered, also please review Full Orders  Family Communication: Admission, patients condition and plan of care including tests being ordered have been discussed with the patient who indicate understanding and agree with the plan and Code Status.  Code Status Full code  Likely DC to home  Condition GUARDED  Consults called: neurology Admission status: inpatient  Time spent in minutes : 45 minutes  Pearson GrippeJames Jamella Grayer M.D on 11/13/2015 at 7:14 PM  Between 7am to 7pm - Pager - [phone]. After 7pm go to [email or web address] - password [email or web address]  [facility] - Office  [phone]

## 2015-11-13 NOTE — Consult Note (Signed)
Requesting Physician: Dr. Fayrene Fearing    Reason for consultation:  Vertebral artery dissection   HPI:                                                                                                                                         Marilyn Black is an 35 y.o. female patient who is 5 Weeks postpartum, with postpartum hemorrhage. She had a new onset of vertigo 2 days ago on Friday evening, was taken to [facility] and was managed for postpartum hemorrhage. She continued to have vertigo symptoms which are episodic irrespective of position, she was brought into the emergency room for further evaluation. 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  The patient denies any vision speech problems or any focal weakness or numbness. The vertigo and gait problems are episodic and not continuous.  Date last known well:  2017,  May 19th Time last known well:  At  8 pm tPA Given: No: Outside time window  Stroke Risk Factors - none  Past Medical History: Past Medical History  Diagnosis Date  . Nocturnal epilepsy (HCC)   . Thalassemia minor   . Postpartum care following vaginal delivery 01/09/2011  . Seizure disorder (HCC) 01/09/2011  . Gestational diabetes mellitus (GDM), antepartum   . Thalassemia trait   . Gestational diabetes     diet controlled  . Thalassemia     Past Surgical History  Procedure Laterality Date  . Wisdom tooth extraction  2002    Family History: Family History  Problem Relation Age of Onset  . Diabetes Mother   . Hypertension Mother   . Cancer Father   . Diabetes Sister   . Mental illness Sister     bipolar  . Cancer Maternal Grandmother   . Diabetes Maternal Grandmother   . Hypertension Maternal Grandmother   . Cancer Maternal Grandfather     Social History:   reports that she quit smoking about 10 years ago. She has never used smokeless tobacco. She reports that she does not drink alcohol  or use illicit drugs.  Allergies:  Allergies  Allergen Reactions  . Codeine Other (See Comments)    fever  . Penicillins Rash    Has patient had a PCN reaction causing immediate rash, facial/tongue/throat swelling, SOB or lightheadedness with hypotension: Yes Has patient had a PCN reaction causing severe rash involving mucus membranes or skin necrosis: No Has patient had a PCN reaction that required hospitalization No Has patient had a PCN reaction occurring within the last 10 years: No If all of the above answers are "NO", then may proceed with Cephalosporin use.  . Sulfa Antibiotics Rash  . Erythromycin Other (See Comments)    unknown     Medications:  Current facility-administered medications:  .   stroke: mapping our early stages of recovery book, , Does not apply, Once, Pearson Grippe, MD .  Melene Muller ON 11/14/2015] aspirin EC tablet 81 mg, 81 mg, Oral, Daily, Pearson Grippe, MD .  hydrALAZINE (APRESOLINE) injection 5 mg, 5 mg, Intravenous, Q6H PRN, Pearson Grippe, MD .  LORazepam (ATIVAN) injection 1 mg, 1 mg, Intravenous, Once, Pearson Grippe, MD .  methylergonovine (METHERGINE) tablet 0.2 mg, 0.2 mg, Oral, TID, Pearson Grippe, MD .  Oxcarbazepine (TRILEPTAL) tablet 300 mg, 300 mg, Oral, BID, Pearson Grippe, MD   ROS:                                                                                                                                       History obtained from the patient  General ROS: negative for - chills, fatigue, fever, night sweats, weight gain or weight loss Psychological ROS: negative for - behavioral disorder, hallucinations, memory difficulties, mood swings or suicidal ideation Ophthalmic ROS: negative for - blurry vision, double vision, eye pain or loss of vision ENT ROS: negative for - epistaxis, nasal discharge, oral lesions, sore throat, tinnitus or vertigo Allergy  and Immunology ROS: negative for - hives or itchy/watery eyes Hematological and Lymphatic ROS: negative for - bleeding problems, bruising or swollen lymph nodes Endocrine ROS: negative for - galactorrhea, hair pattern changes, polydipsia/polyuria or temperature intolerance Respiratory ROS: negative for - cough, hemoptysis, shortness of breath or wheezing Cardiovascular ROS: negative for - chest pain, dyspnea on exertion, edema or irregular heartbeat Gastrointestinal ROS: negative for - abdominal pain, diarrhea, hematemesis, nausea/vomiting or stool incontinence Genito-Urinary ROS: negative for - dysuria, hematuria, incontinence or urinary frequency/urgency Musculoskeletal ROS: negative for - joint swelling or muscular weakness Neurological ROS: as noted in HPI Dermatological ROS: negative for rash and skin lesion changes  Neurologic Examination:                                                                                                    Today's Vitals   11/13/15 1500 11/13/15 1523 11/13/15 1638 11/13/15 1800  BP: 110/74 103/61 123/76 119/70  Pulse: 76 71 71 89  Temp:   98.1 F (36.7 C)   TempSrc:   Oral   Resp: 18 15 16 24   Height:   5\' 5"  (1.651 m)   Weight:   101.6 kg (223 lb 15.8 oz)   SpO2: 100% 99% 99% 99%  PainSc:        Evaluation of higher integrative  functions including: Level of alertness: Alert,  Oriented to time, place and person Recent and remote memory - intact   Attention span and concentration  - intact   Speech: fluent, no evidence of dysarthria or aphasia noted.  Test the following cranial nerves: 2-12 grossly intact Motor examination: Normal tone, bulk, full 5/5 motor strength in all 4 extremities Examination of sensation : Normal and symmetric sensation to pinprick in all 4 extremities and on face Examination of deep tendon reflexes: 2+, normal and symmetric in all extremities, normal plantars bilaterally Test coordination: Normal finger nose testing,  with no evidence of limb appendicular ataxia or abnormal involuntary movements or tremors noted.  Gait: Normal, no gait ataxia noted     Lab Results: Basic Metabolic Panel:  Recent Labs Lab 11/13/15 1130  NA 138  K 4.0  CL 106  CO2 26  GLUCOSE 113*  BUN 17  CREATININE 0.61  CALCIUM 9.3    Liver Function Tests: No results for input(s): AST, ALT, ALKPHOS, BILITOT, PROT, ALBUMIN in the last 168 hours. No results for input(s): LIPASE, AMYLASE in the last 168 hours. No results for input(s): AMMONIA in the last 168 hours.  CBC:  Recent Labs Lab 11/11/15 2248  WBC 12.3*  HGB 12.2  HCT 37.3  MCV 63.4*  PLT 213    Cardiac Enzymes: No results for input(s): CKTOTAL, CKMB, CKMBINDEX, TROPONINI in the last 168 hours.  Lipid Panel: No results for input(s): CHOL, TRIG, HDL, CHOLHDL, VLDL, LDLCALC in the last 168 hours.  CBG: No results for input(s): GLUCAP in the last 168 hours.  Microbiology: Results for orders placed or performed during the hospital encounter of 11/13/15  MRSA PCR Screening     Status: None   Collection Time: 11/13/15  5:10 PM  Result Value Ref Range Status   MRSA by PCR NEGATIVE NEGATIVE Final    Comment:        The GeneXpert MRSA Assay (FDA approved for NASAL specimens only), is one component of a comprehensive MRSA colonization surveillance program. It is not intended to diagnose MRSA infection nor to guide or monitor treatment for MRSA infections.      Imaging: Ct Angio Head W/cm &/or Wo Cm  11/13/2015  ADDENDUM REPORT: 11/13/2015 13:32 ADDENDUM: These results were called by telephone at the time of interpretation on 11/13/2015 at 1:32 pm to Dr. Rolland Porter , who verbally acknowledged these results. Electronically Signed   By: Marin Roberts M.D.   On: 11/13/2015 13:32  11/13/2015  CLINICAL DATA:  Six weeks postpartum with occipital occipital headache. Left posterior neck pain and vertigo. Question left vertebral artery dissection  EXAM: CT ANGIOGRAPHY HEAD AND NECK TECHNIQUE: Multidetector CT imaging of the head and neck was performed using the standard protocol during bolus administration of intravenous contrast. Multiplanar CT image reconstructions and MIPs were obtained to evaluate the vascular anatomy. Carotid stenosis measurements (when applicable) are obtained utilizing NASCET criteria, using the distal internal carotid diameter as the denominator. CONTRAST:  100 mL Isovue 370 COMPARISON:  CT head without contrast 02/11/2007 FINDINGS: CT HEAD Brain: Areas of hypoattenuation are present along the inferior aspect of the left cerebellum concerning for focal infarcts. There is no hemorrhage. No other focal cortical infarcts are present. The basal ganglia are intact. The insular ribbon is normal. The ventricles are of normal size. No significant extra-axial fluid collection is present. Calvarium and skull base: Within normal limits. Paranasal sinuses: Clear Orbits: Unremarkable CTA NECK Aortic arch: A 3 vessel arch  configuration is present. There is no significant stenosis or calcification at the arch. Right carotid system: The right common carotid artery is within normal limits. The bifurcation is unremarkable. Cervical right ICA is normal. Left carotid system: The left common carotid artery is within normal limits. The bifurcation is unremarkable. Cervical left ICA is normal. Vertebral arteries:The vertebral arteries both originate from the subclavian arteries. The left vertebral artery appears to be dominant at the origins. There is significant attenuation of the left vertebral artery at the C5-6 level on the left. The left vertebral artery lumen is narrowed throughout the left neck. There is a discrete narrowing at the C1 level on the left as well. The left vertebral artery is of more normal caliber above the C1 level. The right vertebral artery is patent throughout the neck. There is no focal stenosis. Skeleton: Bone windows are  unremarkable. Vertebral body heights alignment are maintained. No focal lytic or blastic lesions are present. Other neck: The soft tissues of the neck are otherwise unremarkable. No focal mucosal or submucosal lesions are present. CTA HEAD Anterior circulation: The internal carotid arteries are within normal limits from the skullbase through the ICA terminus. The A1 and M1 segments are normal. The anterior communicating artery is patent. The MCA bifurcations are intact. ACA and MCA branch vessels are normal. Posterior circulation: There is mild narrowing of the left vertebral artery above the dural margin. The vessel is reconstituted 2 normal volume below the dural margin on the left at the C1 level. The vertebrobasilar junction is intact. PICA origins are visualized and normal bilaterally. The basilar artery is normal. Both posterior cerebral arteries originate from the basilar tip. The PCA branch vessels are intact. Venous sinuses: The dural sinuses are patent. The straight sinus and deep cerebral veins are intact. Cortical veins are within normal limits. Anatomic variants: None. Delayed phase: The postcontrast images re- demonstrate areas of suspected infarction in the inferior left cerebellum. No other focal cortical infarcts are present. Linear enhancement in the right parietal lobe likely represents a developmental venous anomaly. IMPRESSION: 1. Areas of tandem discrete narrowing of the left vertebral artery concerning for the left vertebral artery dissection. Discrete narrowing is present at the C5-6 level and fifth C1 level on the left. 2. 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 3. The anterior circulation and right vertebral artery are normal. 4. Circle of Willis is unremarkable. No significant proximal stenosis, aneurysm, or branch vessel occlusion. Electronically Signed: By: Marin Roberts M.D. On: 11/13/2015 13:18   Ct Angio Neck W/cm &/or  Wo/cm  11/13/2015  ADDENDUM REPORT: 11/13/2015 13:32 ADDENDUM: These results were called by telephone at the time of interpretation on 11/13/2015 at 1:32 pm to Dr. Rolland Porter , who verbally acknowledged these results. Electronically Signed   By: Marin Roberts M.D.   On: 11/13/2015 13:32  11/13/2015  CLINICAL DATA:  Six weeks postpartum with occipital occipital headache. Left posterior neck pain and vertigo. Question left vertebral artery dissection EXAM: CT ANGIOGRAPHY HEAD AND NECK TECHNIQUE: Multidetector CT imaging of the head and neck was performed using the standard protocol during bolus administration of intravenous contrast. Multiplanar CT image reconstructions and MIPs were obtained to evaluate the vascular anatomy. Carotid stenosis measurements (when applicable) are obtained utilizing NASCET criteria, using the distal internal carotid diameter as the denominator. CONTRAST:  100 mL Isovue 370 COMPARISON:  CT head without contrast 02/11/2007 FINDINGS: CT HEAD Brain: Areas of hypoattenuation are present along the inferior aspect  of the left cerebellum concerning for focal infarcts. There is no hemorrhage. No other focal cortical infarcts are present. The basal ganglia are intact. The insular ribbon is normal. The ventricles are of normal size. No significant extra-axial fluid collection is present. Calvarium and skull base: Within normal limits. Paranasal sinuses: Clear Orbits: Unremarkable CTA NECK Aortic arch: A 3 vessel arch configuration is present. There is no significant stenosis or calcification at the arch. Right carotid system: The right common carotid artery is within normal limits. The bifurcation is unremarkable. Cervical right ICA is normal. Left carotid system: The left common carotid artery is within normal limits. The bifurcation is unremarkable. Cervical left ICA is normal. Vertebral arteries:The vertebral arteries both originate from the subclavian arteries. The left vertebral artery  appears to be dominant at the origins. There is significant attenuation of the left vertebral artery at the C5-6 level on the left. The left vertebral artery lumen is narrowed throughout the left neck. There is a discrete narrowing at the C1 level on the left as well. The left vertebral artery is of more normal caliber above the C1 level. The right vertebral artery is patent throughout the neck. There is no focal stenosis. Skeleton: Bone windows are unremarkable. Vertebral body heights alignment are maintained. No focal lytic or blastic lesions are present. Other neck: The soft tissues of the neck are otherwise unremarkable. No focal mucosal or submucosal lesions are present. CTA HEAD Anterior circulation: The internal carotid arteries are within normal limits from the skullbase through the ICA terminus. The A1 and M1 segments are normal. The anterior communicating artery is patent. The MCA bifurcations are intact. ACA and MCA branch vessels are normal. Posterior circulation: There is mild narrowing of the left vertebral artery above the dural margin. The vessel is reconstituted 2 normal volume below the dural margin on the left at the C1 level. The vertebrobasilar junction is intact. PICA origins are visualized and normal bilaterally. The basilar artery is normal. Both posterior cerebral arteries originate from the basilar tip. The PCA branch vessels are intact. Venous sinuses: The dural sinuses are patent. The straight sinus and deep cerebral veins are intact. Cortical veins are within normal limits. Anatomic variants: None. Delayed phase: The postcontrast images re- demonstrate areas of suspected infarction in the inferior left cerebellum. No other focal cortical infarcts are present. Linear enhancement in the right parietal lobe likely represents a developmental venous anomaly. IMPRESSION: 1. Areas of tandem discrete narrowing of the left vertebral artery concerning for the left vertebral artery dissection.  Discrete narrowing is present at the C5-6 level and fifth C1 level on the left. 2. 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 3. The anterior circulation and right vertebral artery are normal. 4. Circle of Willis is unremarkable. No significant proximal stenosis, aneurysm, or branch vessel occlusion. Electronically Signed: By: Marin Roberts M.D. On: 11/13/2015 13:18     Stroke Scales   NIHSS :  0  Assessment and plan:   Marilyn Black is an 35 y.o. female patient with left vertebral artery dissection, with episodic vertigo symptoms as described above, started 2 days ago. No clear identifiable factors for the left vertebral dissection. She was started on aspirin 325 mg in the ER. She has some postpartum hemorrhage which reportedly is ongoing but minimal, and fairly well-controlled per patient, was evaluated at [facility] yesterday. Recommend continuing aspirin 81 mg daily for now, and may increase to full dose aspirin after the vaginal bleeding  is better controlled. Ordered brain MRI for further evaluation of acute infarcts. Physical outpatient therapy evaluation. Echocardiogram, will be placed on telemetry monitoring.  Neurology stroke team will continue to follow sign tomorrow morning.

## 2015-11-14 ENCOUNTER — Inpatient Hospital Stay (HOSPITAL_COMMUNITY): Payer: 59

## 2015-11-14 ENCOUNTER — Telehealth: Payer: Self-pay | Admitting: Neurology

## 2015-11-14 DIAGNOSIS — I63212 Cerebral infarction due to unspecified occlusion or stenosis of left vertebral arteries: Principal | ICD-10-CM

## 2015-11-14 DIAGNOSIS — I6350 Cerebral infarction due to unspecified occlusion or stenosis of unspecified cerebral artery: Secondary | ICD-10-CM

## 2015-11-14 DIAGNOSIS — E785 Hyperlipidemia, unspecified: Secondary | ICD-10-CM

## 2015-11-14 LAB — COMPREHENSIVE METABOLIC PANEL
ALBUMIN: 3.8 g/dL (ref 3.5–5.0)
ALK PHOS: 89 U/L (ref 38–126)
ALT: 58 U/L — AB (ref 14–54)
ANION GAP: 8 (ref 5–15)
AST: 27 U/L (ref 15–41)
BILIRUBIN TOTAL: 0.6 mg/dL (ref 0.3–1.2)
BUN: 15 mg/dL (ref 6–20)
CALCIUM: 9 mg/dL (ref 8.9–10.3)
CO2: 24 mmol/L (ref 22–32)
CREATININE: 0.67 mg/dL (ref 0.44–1.00)
Chloride: 106 mmol/L (ref 101–111)
GFR calc Af Amer: >60 mL/min (ref >60)
GFR calc non Af Amer: >60 mL/min (ref >60)
GLUCOSE: 93 mg/dL (ref 65–99)
Potassium: 4.3 mmol/L (ref 3.5–5.1)
Sodium: 138 mmol/L (ref 135–145)
TOTAL PROTEIN: 6.2 g/dL — AB (ref 6.5–8.1)

## 2015-11-14 LAB — RAPID URINE DRUG SCREEN, HOSP PERFORMED
AMPHETAMINES: NOT DETECTED
BARBITURATES: NOT DETECTED
BENZODIAZEPINES: NOT DETECTED
COCAINE: NOT DETECTED
Opiates: NOT DETECTED
TETRAHYDROCANNABINOL: NOT DETECTED

## 2015-11-14 LAB — CBC
HCT: 36.1 % (ref 36.0–46.0)
HEMOGLOBIN: 11.7 g/dL — AB (ref 12.0–15.0)
MCH: 20.5 pg — ABNORMAL LOW (ref 26.0–34.0)
MCHC: 32.4 g/dL (ref 30.0–36.0)
MCV: 63.2 fL — ABNORMAL LOW (ref 78.0–100.0)
PLATELETS: 213 10*3/uL (ref 150–400)
RBC: 5.71 MIL/uL — ABNORMAL HIGH (ref 3.87–5.11)
RDW: 14.5 % (ref 11.5–15.5)
WBC: 10.8 10*3/uL — ABNORMAL HIGH (ref 4.0–10.5)

## 2015-11-14 LAB — LIPID PANEL
CHOLESTEROL: 229 mg/dL — AB (ref 0–200)
HDL: 78 mg/dL (ref >40)
LDL Cholesterol: 135 mg/dL — ABNORMAL HIGH (ref 0–99)
TRIGLYCERIDES: 78 mg/dL (ref <150)
Total CHOL/HDL Ratio: 2.9 RATIO
VLDL: 16 mg/dL (ref 0–40)

## 2015-11-14 LAB — ECHOCARDIOGRAM COMPLETE
HEIGHTINCHES: 65 in
Weight: 3583.8 oz

## 2015-11-14 LAB — ANTITHROMBIN III: ANTITHROMB III FUNC: 120 % (ref 75–120)

## 2015-11-14 MED ORDER — ASPIRIN EC 325 MG PO TBEC: 325.0000 mg | DELAYED_RELEASE_TABLET | Freq: Every day | ORAL | Status: DC

## 2015-11-14 NOTE — Evaluation (Signed)
Physical Therapy Evaluation Patient Details Name: Marilyn Black MRN: [id number] DOB: Apr 10, 1981 Today's Date: 11/14/2015   History of Present Illness  Marilyn Black is a 35 y.o. female, 5 weeks postpartum, seizure do, who apparently c/o dizziness. Pt has had vertigo for the past 2 days. Pt also has had some occipital headache as well as neck pain. Pt also notes some perioral numbness, tingling. Pt denies any vision change. Pt denies focal weakness, cp, palp, sob. Pt presented to ED and found to have cerebellar infarct and also vertebral dissection.    Clinical Impression  Pt admitted with above diagnosis. Pt currently with functional limitations due to the deficits listed below (see PT Problem List). Marilyn Black demonstrates balance impairments as documented below, specific when turning Lt while ambulating.  Pt will benefit from skilled PT and OPPT at d/c to increase their independence and safety with mobility to allow discharge to the venue listed below.      Follow Up Recommendations Outpatient PT;Supervision - Intermittent    Equipment Recommendations  None recommended by PT    Recommendations for Other Services       Precautions / Restrictions Precautions Precautions: Fall Restrictions Weight Bearing Restrictions: No      Mobility  Bed Mobility Overal bed mobility: Independent             General bed mobility comments: no cues or physical assist needed  Transfers Overall transfer level: Needs assistance Equipment used: None Transfers: Sit to/from Stand Sit to Stand: Supervision         General transfer comment: Supervision for safety.  Pt denies dizziness.  Ambulation/Gait Ambulation/Gait assistance: Supervision Ambulation Distance (Feet): 500 Feet Assistive device: None Gait Pattern/deviations: Step-through pattern   Gait velocity interpretation: at or above normal speed for age/gender General Gait Details: Min instability noted w/ Lt turns as pt  describes feeling "off balance" that goes away immediately once back ambulating straight.    Stairs            Wheelchair Mobility    Modified Rankin (Stroke Patients Only) Modified Rankin (Stroke Patients Only) Pre-Morbid Rankin Score: No symptoms Modified Rankin: Moderately severe disability (supervision for safety)     Balance Overall balance assessment: Needs assistance Sitting-balance support: No upper extremity supported;Feet supported Sitting balance-Leahy Scale: Normal     Standing balance support: No upper extremity supported;During functional activity Standing balance-Leahy Scale: Fair   Single Leg Stance - Right Leg: 15 (w/ eyes close.  +sway, requires UE support) Single Leg Stance - Left Leg: 15 (w/ eyes close.  +sway, requires UE support)     Rhomberg - Eyes Opened:  (WNL) Rhomberg - Eyes Closed:  (WNL)     Standardized Balance Assessment Standardized Balance Assessment : Dynamic Gait Index   Dynamic Gait Index Level Surface: Mild Impairment Change in Gait Speed: Normal Gait with Horizontal Head Turns: Normal Gait with Vertical Head Turns: Normal Gait and Pivot Turn: Mild Impairment Step Over Obstacle: Mild Impairment Step Around Obstacles: Normal       Pertinent Vitals/Pain Pain Assessment: 0-10 Pain Score: 2  Pain Location: posterior head Pain Descriptors / Indicators: Dull Pain Intervention(s): Limited activity within patient's tolerance;Monitored during session    Home Living Family/patient expects to be discharged to:: Private residence Living Arrangements: Spouse/significant other Available Help at Discharge: Family;Available PRN/intermittently (husband works during the day) Type of Home: House Home Access: Stairs to enter Entrance Stairs-Rails: LawyerLeft;Right Entrance Stairs-Number of Steps: 3 Home Layout: One level Home Equipment: Gilmer Morane -  single point      Prior Function Level of Independence: Independent         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     Hand Dominance   Dominant Hand: Right    Extremity/Trunk Assessment   Upper Extremity Assessment: Overall WFL for tasks assessed           Lower Extremity Assessment: Overall WFL for tasks assessed         Communication   Communication: No difficulties  Cognition Arousal/Alertness: Awake/alert Behavior During Therapy: WFL for tasks assessed/performed Overall Cognitive Status: Within Functional Limits for tasks assessed                      General Comments General comments (skin integrity, edema, etc.): Pt reports that PTA her dizziness would occur when bending down to pick up objects from floor.  Talked w/ pt about avoiding this posture and to ask for assist.  Additionally, pt is to take caution when turning Lt when ambulating.    Exercises        Assessment/Plan    PT Assessment Patient needs continued PT services  PT Diagnosis Difficulty walking   PT Problem List Decreased balance;Decreased safety awareness;Pain  PT Treatment Interventions DME instruction;Gait training;Stair training;Balance training;Therapeutic exercise;Patient/family education   PT Goals (Current goals can be found in the Care Plan section) Acute Rehab PT Goals Patient Stated Goal: to go home PT Goal Formulation: With patient Time For Goal Achievement: 11/21/15 Potential to Achieve Goals: Good    Frequency Min 3X/week   Barriers to discharge        Co-evaluation               End of Session Equipment Utilized During Treatment: Gait belt Activity Tolerance: Patient tolerated treatment well Patient left: in chair;with call bell/phone within reach;with family/visitor present Nurse Communication: Mobility status         Time: 1610-9604 PT Time Calculation (min) (ACUTE ONLY): 22 min   Charges:   PT Evaluation $PT Eval Low Complexity: 1 Procedure     PT G Codes:       Encarnacion Chu PT, DPT  Pager:  [phone] Phone: [phone] 11/14/2015, 11:30 AM

## 2015-11-14 NOTE — Progress Notes (Signed)
Patient is being discharged to home with her father taking her home.  Discharge instructions and prescription for Aspirin given.  Patient verbalized understanding.

## 2015-11-14 NOTE — Care Management Note (Signed)
Case Management Note  Patient Details  Name: Marilyn Black D Monday MRN: [id number] Date of Birth: Jun 17, 1981  Subjective/Objective:     PT recommended OP PT for pt 2/2 vertigo and balance issues.  Discussed with pt who agrees - [facility]                     Expected Discharge Plan:  Home/Self Care  Discharge planning Services  CM Consult  Status of Service:  Completed, signed off  Magdalene RiverMayo, Doneshia Hill T, [place]RN 11/14/2015, 4:05 PM

## 2015-11-14 NOTE — Discharge Summary (Signed)
Physician Discharge Summary  Marilyn Black OZH:[id number] DOB: Feb 26, 1981 DOA: 11/13/2015  PCP: Mickie Hillier, MD  Admit date: 11/13/2015 Discharge date: 11/14/2015  Time spent: 35 minutes  Recommendations for Outpatient Follow-up:  1. Discharge home with outpatient PCP follow-up in one week. Please check results of  hypercoagulable workup and  A repeat hemoglobin. 2. follow-up with neurologist Dr Roda Shutters in 2-3  weeks.    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 Patient was having postpartum hemorrhage and went to [facility] where she was managed. Since she continued to have vertigo symptoms which were episodic and without change in position seat came to the ED. CT angiogram of the neck showed left vertebral artery dissection with suspicion for small left cerebellar infarct. MRI of the cervical spine showed acute left vertebral artery dissection. MRI of the brain showed patchy acute versus subacute ischemic nonhemorrhagic infarct involving left inferior cerebellum and superior right cerebellum.  Hospital Course:  Acute cerebellar infarct with vertebral artery dissection No further symptoms. Appreciate neurology recommendations. 2-D echo with EF of 65-70% with no wall motion abdomen mildly or cardiac source of infarct. CT angiogram of the neck shows carotid arteries within normal limits. Started on aspirin. [facility] pharmacy  recommended that he was okay to start full dose aspirin although it is excreted in breast milk.). Given patient is actively breast-feeding did not start her on lipid-lowering medications (LDL of 135). Physical therapy recommends outpatient PT (prescriptions provided) Appreciate stroke team evaluation this morning and recommends no further workup. Suggest that this is unlikely embolic and bilateral cerebellar infarct is possibly associated with the vertebral artery dissection. Recommends follow-up in the office in 2-3 weeks with repeat imaging.  Postpartum hemorrhage Continue Methergine. Recommend checking H&H within one week. (Now that patient is also on full dose aspirin) Patient following up with her OB.  History of epilepsy Continue Trileptal  Procedures:  CT angiogram head and neck  MRI brain and cervical spine  2-D echo  Consultations:  Stroke team  Discharge Exam: Filed Vitals:   11/14/15 0856 11/14/15 1149  BP:  115/78  Pulse: 83 100  Temp: 98 F (36.7 C) 98.7 F (37.1 C)  Resp: 18 15    General: Middle aged female not in distress  HEENT: No pallor, moist mucosa, supple neck Chest: Clear bilaterally CVS: Normal S1 and S2, no murmurs rub or gallop GI: Soft, nondistended, nontender, bowel sounds present Musculoskeletal: Warm, no edema  CNS: Alert and oriented, nonfocal    Discharge Instructions    Current Discharge Medication List    START taking these medications   Details  aspirin EC 325 MG tablet Take 1 tablet (325 mg total) by mouth daily. Qty: 30 tablet, Refills: 0      CONTINUE these medications which have NOT CHANGED   Details  Cholecalciferol (VITAMIN D) 2000 units tablet Take 4,000 Units by mouth at bedtime.    methylergonovine (METHERGINE)  0.2 MG tablet Take 1 tablet (0.2 mg total) by mouth 3 (three) times daily. Qty: 9 tablet, Refills: 0   Associated Diagnoses: Postpartum symptom    Oxcarbazepine (TRILEPTAL) 300 MG tablet Take 300 mg by  mouth 2 (two) times daily.     Prenatal Multivit-Min-Fe-FA (PRENATAL VITAMINS PO) Take 1 tablet by mouth at bedtime.     Fenugreek 500 MG CAPS Take 500 mg by mouth 3 (three) times daily.   Associated Diagnoses: Postpartum symptom      STOP taking these medications     ibuprofen (ADVIL,MOTRIN) 600 MG tablet      senna-docusate (SENOKOT-S) 8.6-50 MG tablet        Allergies  Allergen Reactions  . Codeine Other (See Comments)    fever  . Penicillins Rash    Has patient had a PCN reaction causing immediate rash, facial/tongue/throat swelling, SOB or lightheadedness with hypotension: Yes Has patient had a PCN reaction causing severe rash involving mucus membranes or skin necrosis: No Has patient had a PCN reaction that required hospitalization No Has patient had a PCN reaction occurring within the last 10 years: No If all of the above answers are "NO", then may proceed with Cephalosporin use.  . Sulfa Antibiotics Rash  . Erythromycin Other (See Comments)    Infant allergic reaction   Follow-up Information    Follow up with Mickie Hillier, MD. Schedule an appointment as soon as possible for a visit in 1 week.   Specialty:  Family Medicine   Contact information:   [address] [place] [address] [phone]       Follow up with Xu,Jindong, MD. Schedule an appointment as soon as possible for a visit in 2 weeks.   Specialty:  Neurology   Contact information:   [address] [place] [address] [phone]        The results of significant diagnostics from this hospitalization (including imaging, microbiology, ancillary and laboratory) are listed below for reference.    Significant Diagnostic Studies: Ct Angio Head W/cm &/or Wo Cm  11/13/2015  ADDENDUM REPORT: 11/13/2015 13:32 ADDENDUM: These results were called by telephone at the time of interpretation on 11/13/2015 at 1:32 pm to Dr. Rolland Porter , who verbally acknowledged these results.  Electronically Signed   By: Marin Roberts M.D.   On: 11/13/2015 13:32  11/13/2015  CLINICAL DATA:  Six weeks postpartum with occipital occipital headache. Left posterior neck pain and vertigo. Question left vertebral artery dissection EXAM: CT ANGIOGRAPHY HEAD AND NECK TECHNIQUE: Multidetector CT imaging of the head and neck was performed using the standard protocol during bolus administration of intravenous contrast. Multiplanar CT image reconstructions and MIPs were obtained to evaluate the vascular anatomy. Carotid stenosis measurements (when applicable) are obtained utilizing NASCET criteria, using the distal internal carotid diameter as the denominator. CONTRAST:  100 mL Isovue 370 COMPARISON:  CT head without contrast 02/11/2007 FINDINGS: CT HEAD Brain: Areas of hypoattenuation are present along the inferior aspect of the left cerebellum concerning for focal infarcts. There is no hemorrhage. No other focal cortical infarcts are present. The basal ganglia are intact. The insular ribbon is normal. The ventricles are of normal size. No significant extra-axial fluid collection is present. Calvarium and skull base: Within normal limits. Paranasal sinuses: Clear Orbits: Unremarkable CTA NECK Aortic arch: A 3 vessel arch configuration is present. There is no significant stenosis or calcification at the arch. Right carotid system: The right common carotid artery is within normal limits.  The bifurcation is unremarkable. Cervical right ICA is normal. Left carotid system: The left common carotid artery is within normal limits. The bifurcation is unremarkable. Cervical left ICA is normal. Vertebral arteries:The vertebral arteries both originate from the subclavian arteries. The left vertebral artery appears to be dominant at the origins. There is significant attenuation of the left vertebral artery at the C5-6 level on the left. The left vertebral artery lumen is narrowed throughout the left neck. There is a  discrete narrowing at the C1 level on the left as well. The left vertebral artery is of more normal caliber above the C1 level. The right vertebral artery is patent throughout the neck. There is no focal stenosis. Skeleton: Bone windows are unremarkable. Vertebral body heights alignment are maintained. No focal lytic or blastic lesions are present. Other neck: The soft tissues of the neck are otherwise unremarkable. No focal mucosal or submucosal lesions are present. CTA HEAD Anterior circulation: The internal carotid arteries are within normal limits from the skullbase through the ICA terminus. The A1 and M1 segments are normal. The anterior communicating artery is patent. The MCA bifurcations are intact. ACA and MCA branch vessels are normal. Posterior circulation: There is mild narrowing of the left vertebral artery above the dural margin. The vessel is reconstituted 2 normal volume below the dural margin on the left at the C1 level. The vertebrobasilar junction is intact. PICA origins are visualized and normal bilaterally. The basilar artery is normal. Both posterior cerebral arteries originate from the basilar tip. The PCA branch vessels are intact. Venous sinuses: The dural sinuses are patent. The straight sinus and deep cerebral veins are intact. Cortical veins are within normal limits. Anatomic variants: None. Delayed phase: The postcontrast images re- demonstrate areas of suspected infarction in the inferior left cerebellum. No other focal cortical infarcts are present. Linear enhancement in the right parietal lobe likely represents a developmental venous anomaly. IMPRESSION: 1. Areas of tandem discrete narrowing of the left vertebral artery concerning for the left vertebral artery dissection. Discrete narrowing is present at the C5-6 level and fifth C1 level on the left. 2. 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 3. The anterior circulation and right vertebral  artery are normal. 4. Circle of Willis is unremarkable. No significant proximal stenosis, aneurysm, or branch vessel occlusion. Electronically Signed: By: Marin Roberts M.D. On: 11/13/2015 13:18   Ct Angio Neck W/cm &/or Wo/cm  11/13/2015  ADDENDUM REPORT: 11/13/2015 13:32 ADDENDUM: These results were called by telephone at the time of interpretation on 11/13/2015 at 1:32 pm to Dr. Rolland Porter , who verbally acknowledged these results. Electronically Signed   By: Marin Roberts M.D.   On: 11/13/2015 13:32  11/13/2015  CLINICAL DATA:  Six weeks postpartum with occipital occipital headache. Left posterior neck pain and vertigo. Question left vertebral artery dissection EXAM: CT ANGIOGRAPHY HEAD AND NECK TECHNIQUE: Multidetector CT imaging of the head and neck was performed using the standard protocol during bolus administration of intravenous contrast. Multiplanar CT image reconstructions and MIPs were obtained to evaluate the vascular anatomy. Carotid stenosis measurements (when applicable) are obtained utilizing NASCET criteria, using the distal internal carotid diameter as the denominator. CONTRAST:  100 mL Isovue 370 COMPARISON:  CT head without contrast 02/11/2007 FINDINGS: CT HEAD Brain: Areas of hypoattenuation are present along the inferior aspect of the left cerebellum concerning for focal infarcts. There is no hemorrhage. No other focal cortical infarcts are present. The basal ganglia are intact. The  insular ribbon is normal. The ventricles are of normal size. No significant extra-axial fluid collection is present. Calvarium and skull base: Within normal limits. Paranasal sinuses: Clear Orbits: Unremarkable CTA NECK Aortic arch: A 3 vessel arch configuration is present. There is no significant stenosis or calcification at the arch. Right carotid system: The right common carotid artery is within normal limits. The bifurcation is unremarkable. Cervical right ICA is normal. Left carotid system:  The left common carotid artery is within normal limits. The bifurcation is unremarkable. Cervical left ICA is normal. Vertebral arteries:The vertebral arteries both originate from the subclavian arteries. The left vertebral artery appears to be dominant at the origins. There is significant attenuation of the left vertebral artery at the C5-6 level on the left. The left vertebral artery lumen is narrowed throughout the left neck. There is a discrete narrowing at the C1 level on the left as well. The left vertebral artery is of more normal caliber above the C1 level. The right vertebral artery is patent throughout the neck. There is no focal stenosis. Skeleton: Bone windows are unremarkable. Vertebral body heights alignment are maintained. No focal lytic or blastic lesions are present. Other neck: The soft tissues of the neck are otherwise unremarkable. No focal mucosal or submucosal lesions are present. CTA HEAD Anterior circulation: The internal carotid arteries are within normal limits from the skullbase through the ICA terminus. The A1 and M1 segments are normal. The anterior communicating artery is patent. The MCA bifurcations are intact. ACA and MCA branch vessels are normal. Posterior circulation: There is mild narrowing of the left vertebral artery above the dural margin. The vessel is reconstituted 2 normal volume below the dural margin on the left at the C1 level. The vertebrobasilar junction is intact. PICA origins are visualized and normal bilaterally. The basilar artery is normal. Both posterior cerebral arteries originate from the basilar tip. The PCA branch vessels are intact. Venous sinuses: The dural sinuses are patent. The straight sinus and deep cerebral veins are intact. Cortical veins are within normal limits. Anatomic variants: None. Delayed phase: The postcontrast images re- demonstrate areas of suspected infarction in the inferior left cerebellum. No other focal cortical infarcts are present.  Linear enhancement in the right parietal lobe likely represents a developmental venous anomaly. IMPRESSION: 1. Areas of tandem discrete narrowing of the left vertebral artery concerning for the left vertebral artery dissection. Discrete narrowing is present at the C5-6 level and fifth C1 level on the left. 2. 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 3. The anterior circulation and right vertebral artery are normal. 4. Circle of Willis is unremarkable. No significant proximal stenosis, aneurysm, or branch vessel occlusion. Electronically Signed: By: Marin Robertshristopher  Mattern M.D. On: 11/13/2015 13:18   Mr Brain Wo Contrast  11/14/2015  CLINICAL DATA:  Initial valuation for acute stroke. With no left vertebral artery dissection. EXAM: MRI HEAD WITHOUT CONTRAST TECHNIQUE: Multiplanar, multiecho pulse sequences of the brain and surrounding structures were obtained without intravenous contrast. COMPARISON:  Prior studies from 11/13/2015. FINDINGS: Cerebral volume normal for patient age. No significant white matter disease. There is patchy abnormal restricted diffusion involving the inferior left cerebellar hemisphere with extension into the left cerebellar tonsil. This corresponds with previously seen hypodensity on prior CT. No associated hemorrhage or mass effect. This is acute to early subacute in appearance. There is an additional punctate acute/early subacute infarct measuring approximately 4 mm within the superior right cerebellar hemisphere (series 4, image 12). No other  acute infarct. No brainstem infarct. Major intracranial vascular flow voids are maintained. Gray-white matter differentiation otherwise well preserved. No acute or chronic intracranial hemorrhage. No other areas of chronic infarction. No mass lesion, midline shift, or mass effect. No hydrocephalus. No extra-axial fluid collection. Major dural sinuses are grossly patent. Craniocervical junction within normal  limits. Visualized upper cervical spine normal without significant degenerative changes. Pituitary gland normal.  No acute abnormality about the orbits. Paranasal sinuses are clear. No mastoid effusion. Inner ear structures within normal limits. Bone marrow signal intensity normal. No scalp soft tissue abnormality. IMPRESSION: 1. Patchy acute/early subacute ischemic nonhemorrhagic infarcts involving the inferior left cerebellum. No associated hemorrhage or mass effect. 2. Punctate 4 mm acute/early subacute ischemic nonhemorrhagic infarct involving the superior right cerebellum. 3. Otherwise normal brain MRI. Electronically Signed   By: Rise Mu M.D.   On: 11/14/2015 06:39   Mr Cervical Spine Wo Contrast  11/14/2015  CLINICAL DATA:  Initial evaluation for acute vertebral artery dissection. EXAM: MRI CERVICAL SPINE WITHOUT CONTRAST TECHNIQUE: Multiplanar, multisequence MR imaging of the cervical spine was performed. No intravenous contrast was administered. COMPARISON:  Prior CTA from 11/13/2015. FINDINGS: Changes related to acute/early subacute stroke partially visualized within the inferior left cerebellar hemisphere, better evaluated on concomitant MRI of the brain. Craniocervical junction widely patent and normal in appearance. Vertebral bodies are normally aligned with preservation of the normal cervical lordosis. Vertebral body heights well preserved. No fracture or malalignment. Signal intensity within the vertebral body bone marrow is normal. No marrow edema. Signal intensity within the cervical spinal cord is normal. Subtle asymmetric flow void present within the left vertebral artery with cresenteric abnormal T1 signal intensity at the level of C5-6 (series 9, image 19). Finding consistent with acute vertebral artery dissection as seen on prior CTA. Dissection likely involves the entirety of the visualized left V2 segment, but again is most prevalent at the C5-6 level. Mild luminal narrowing  without definite raised intimal flap. The right vertebral artery is normal in appearance. Remainder of the paraspinous soft tissues demonstrate no acute abnormality. No prevertebral or paraspinous edema. Probable minimal annular disc bulge at the C5-6 level. No other significant degenerative spondylolysis within the cervical spine. No focal disc herniation. No canal or foraminal stenosis. IMPRESSION: 1. Acute left vertebral artery dissection, similar relative to prior CTA 11/13/2015. 2. Changes related to acute/early subacute infarct within inferior left cerebellar hemisphere, better evaluated on concomitant MRI of the brain. 3. Otherwise normal MRI of the cervical spine. Electronically Signed   By: Rise Mu M.D.   On: 11/14/2015 06:59    Microbiology: Recent Results (from the past 240 hour(s))  MRSA PCR Screening     Status: None   Collection Time: 11/13/15  5:10 PM  Result Value Ref Range Status   MRSA by PCR NEGATIVE NEGATIVE Final    Comment:        The GeneXpert MRSA Assay (FDA approved for NASAL specimens only), is one component of a comprehensive MRSA colonization surveillance program. It is not intended to diagnose MRSA infection nor to guide or monitor treatment for MRSA infections.      Labs: Basic Metabolic Panel:  Recent Labs Lab 11/13/15 1130 11/14/15 0344  NA 138 138  K 4.0 4.3  CL 106 106  CO2 26 24  GLUCOSE 113* 93  BUN 17 15  CREATININE 0.61 0.67  CALCIUM 9.3 9.0   Liver Function Tests:  Recent Labs Lab 11/14/15 0344  AST 27  ALT 58*  ALKPHOS 89  BILITOT 0.6  PROT 6.2*  ALBUMIN 3.8   No results for input(s): LIPASE, AMYLASE in the last 168 hours. No results for input(s): AMMONIA in the last 168 hours. CBC:  Recent Labs Lab 11/11/15 2248 11/14/15 0344  WBC 12.3* 10.8*  HGB 12.2 11.7*  HCT 37.3 36.1  MCV 63.4* 63.2*  PLT 213 213   Cardiac Enzymes: No results for input(s): CKTOTAL, CKMB, CKMBINDEX, TROPONINI in the last 168  hours. BNP: BNP (last 3 results) No results for input(s): BNP in the last 8760 hours.  ProBNP (last 3 results) No results for input(s): PROBNP in the last 8760 hours.  CBG: No results for input(s): GLUCAP in the last 168 hours.     Signed:  Eddie North MD.  [facility] 11/14/2015, 2:20 PM

## 2015-11-14 NOTE — Progress Notes (Signed)
STROKE TEAM PROGRESS NOTE   HISTORY OF PRESENT ILLNESS (per record) Marilyn Black is an 35 y.o. female patient who is 5 Weeks postpartum, with postpartum hemorrhage. She had a new onset of vertigo 2 days ago on Friday evening, was taken to [facility] and was managed for postpartum hemorrhage (LKW 11/11/2015 at 8p). She continued to have vertigo symptoms which are episodic irrespective of position. She was brought into the emergency room for further evaluation. 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 The patient denies any vision speech problems or any focal weakness or numbness. The vertigo and gait problems are episodic and not continuous. Patient was not administered IV t-PA secondary to being outside time window in setting of postpartum hemorrhage. She was admitted for further evaluation and treatment.   SUBJECTIVE (INTERVAL HISTORY) Her father is at the bedside.  Overall she feels her condition is completely resolved. She is 5 weeks postpartm and still lactating. 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 Will continue ASA but no plavix or statin due to contraindication for lactation.    OBJECTIVE Temp:  [98 F (36.7 C)-98.6 F (37 C)] 98.1 F (36.7 C) (05/22 0400) Pulse Rate:  [69-89] 82 (05/22 0400) Cardiac Rhythm:  [-] Normal sinus rhythm (05/22 0400) Resp:  [11-24] 14 (05/22 0400) BP: (103-124)/(61-91) 124/91 mmHg (05/22 0400) SpO2:  [97 %-100 %] 100 % (05/22 0400) Weight:  [101.6 kg (223 lb 15.8 oz)-102.967 kg (227 lb)] 101.6 kg (223 lb 15.8 oz) (05/21 1638)  CBC:  Recent Labs Lab 11/11/15 2248 11/14/15 0344  WBC 12.3* 10.8*  HGB 12.2 11.7*  HCT 37.3 36.1  MCV 63.4* 63.2*  PLT 213 213    Basic Metabolic Panel:  Recent Labs Lab 11/13/15 1130 11/14/15 0344  NA 138 138  K 4.0 4.3  CL 106 106  CO2 26 24  GLUCOSE 113* 93  BUN 17 15  CREATININE 0.61 0.67  CALCIUM 9.3 9.0    Lipid Panel:     Component Value Date/Time   CHOL 229* 11/14/2015 0344   TRIG 78 11/14/2015 0344   HDL 78 11/14/2015 0344   CHOLHDL 2.9 11/14/2015 0344   VLDL 16 11/14/2015 0344   LDLCALC 135* 11/14/2015 0344   HgbA1c: No results found for: HGBA1C Urine Drug Screen:    Component Value Date/Time   LABOPIA NONE DETECTED 11/14/2015 0435   COCAINSCRNUR NONE DETECTED 11/14/2015 0435   LABBENZ NONE DETECTED 11/14/2015 0435   AMPHETMU NONE DETECTED 11/14/2015 0435   THCU NONE DETECTED 11/14/2015 0435   LABBARB NONE DETECTED 11/14/2015 0435      IMAGING I have personally reviewed the radiological images below and agree with the radiology interpretations.  Ct Angio Head & neck W/cm &/or Wo Cm 11/13/2015  1. Areas of tandem discrete narrowing of the left vertebral artery concerning for the left vertebral artery dissection. Discrete narrowing is present at the C5-6 level and fifth C1 level on the left. 2. 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 3. The anterior circulation and right vertebral artery are normal. 4. Circle of Willis is unremarkable. No significant proximal stenosis, aneurysm, or branch vessel occlusion. Electronically Signed: By: Marin Roberts M.D. On: 11/13/2015 13:18   Mr Brain Wo Contrast 11/14/2015  1. Patchy acute/early subacute ischemic nonhemorrhagic infarcts involving the inferior left cerebellum. No associated hemorrhage or mass effect. 2. Punctate 4 mm acute/early subacute ischemic nonhemorrhagic infarct involving the superior right  cerebellum. 3. Otherwise normal brain MRI.    Mr Cervical Spine Wo Contrast 11/14/2015   1. Acute left vertebral artery dissection, similar relative to prior CTA 11/13/2015. 2. Changes related to acute/early subacute infarct within inferior left cerebellar hemisphere, better evaluated on concomitant MRI of the brain. 3. Otherwise normal MRI of the cervical spine.   TTE - Left ventricle: The cavity size was  normal. Wall thickness was  normal. Systolic function was vigorous. The estimated ejection  fraction was in the range of 65% to 70%. Left ventricular  diastolic function parameters were normal.   PHYSICAL EXAM  Temp:  [98 F (36.7 C)-98.7 F (37.1 C)] 98.7 F (37.1 C) (05/22 1149) Pulse Rate:  [82-100] 100 (05/22 1149) Resp:  [13-18] 15 (05/22 1149) BP: (107-124)/(71-91) 115/78 mmHg (05/22 1149) SpO2:  [97 %-100 %] 99 % (05/22 1149)  General - Well nourished, well developed, in no apparent distress.  Ophthalmologic - Sharp disc margins OU.   Cardiovascular - Regular rate and rhythm with no murmur.  Mental Status -  Level of arousal and orientation to time, place, and person were intact. Language including expression, naming, repetition, comprehension was assessed and found intact. Fund of Knowledge was assessed and was intact.  Cranial Nerves II - XII - II - Visual field intact OU. III, IV, VI - Extraocular movements intact. V - Facial sensation intact bilaterally. VII - Facial movement intact bilaterally. VIII - Hearing & vestibular intact bilaterally. X - Palate elevates symmetrically. XI - Chin turning & shoulder shrug intact bilaterally. XII - Tongue protrusion intact.  Motor Strength - The patient's strength was normal in all extremities and pronator drift was absent.  Bulk was normal and fasciculations were absent.   Motor Tone - Muscle tone was assessed at the neck and appendages and was normal.  Reflexes - The patient's reflexes were 1+ in all extremities and she had no pathological reflexes.  Sensory - Light touch, temperature/pinprick were assessed and were symmetrical.    Coordination - The patient had normal movements in the hands and feet with no ataxia or dysmetria.  Tremor was absent.  Gait and Station - The patient's transfers, posture, gait, station, and turns were observed as normal.   ASSESSMENT/PLAN [name] is a 35 y.o. female 5  weeks postpartum with hx seizure d/o presenting with sudden onset vertigo in setting of postpartum hemorhage. She did not receive IV t-PA due to delay in arrival, postpartum hemorrhage.   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 L cerebellar infarcts.  2D Echo  EF 65-70%  LDL 135  HgbA1c 5.8  SCDs for VTE prophylaxis  Diet Heart Room service appropriate?: Yes; Fluid consistency:: Thin  No antithrombotic prior to admission, initially placed on aspirin 325 mg, now on aspirin 81 mg daily given postpartum bleeding. OK to dc with ASA 325mg  daily.   Patient counseled to be compliant with her antithrombotic medications  Ongoing aggressive stroke risk factor management  Therapy recommendations:  outpt PT  Disposition:  Home  Post partum 5 week  Still lactating  Higher risk for CVA event within 12 weeks of delievery  Hyperlipidemia  Home meds:  No statin  LDL 135, goal < 70  No statin as contraindicated with lactation, discussed with inpt pharmacist.  Continue statin at discharge  Gestational diabetes  Diet controlled  HgbA1c 5.8, goal < 7.0  Resolved after delivery  Other Stroke Risk Factors  Former Cigarette smoker  Obesity, Body mass index is 37.27 kg/(m^2).   Postpartum  Other Active Problems  Seizure d/o on trileptal - follows with [facility] neurology  Nocturnal epilepsy  Thalassemia minor  Hospital day # 1  Neurology will sign off. Please call with questions. Pt will follow up with Dr. Roda Shutters at [facility] in about 2 months then follow up with her neurologist in [facility]. Thanks for the consult.  Marvel Plan, MD PhD Stroke Neurology 11/15/2015 1:09 AM    To contact Stroke Continuity  provider, please refer to [email or web address]. After hours, contact General Neurology

## 2015-11-14 NOTE — Telephone Encounter (Signed)
Rn call patient back about her hospital follow up. Pt is 5 week post part num after having her baby. Pt stated she is fine with seeing Dr.Xu on January 17, 2016 at 0300pm. She was discharge on 11/13/2015 for a stroke. Pt stated she will need a refill on aspirin 325 once her 30 days run out. Rn ask patient to call back  3 days prior and she can get another refill. Pt stated she was told about having a MRI and CT angiogram once discharge. Pt stated she has to heal first. She just wants to know which test Dr. Roda ShuttersXu wants. Pt verbalized understanding.

## 2015-11-14 NOTE — Progress Notes (Signed)
Echocardiogram 2D Echocardiogram has been performed.  Marilyn Black 11/14/2015, 9:14 AM

## 2015-11-14 NOTE — Telephone Encounter (Signed)
Patient called to schedule 2-3 week hospital follow-up for stroke 11/11/15. 1st availability is 2 months out. Please call patient to schedule.

## 2015-11-14 NOTE — Progress Notes (Signed)
Pharmacy - Aspirin Dose  Aspirin is excreted in breast milk in low concentrations. Adverse effects on platelet function in the infant have not been reported but this could be a potential risk. That being said, it is likely ok to use either 81 mg or 325 mg for this patient.  DoraJennifer Hospers, 17[address] Rainbow BoulevardPharm.D., BCPS Clinical Pharmacist Pager: [phone] 11/14/2015 11:44 AM

## 2015-11-14 NOTE — Discharge Instructions (Signed)
Stroke Prevention °Some health problems and behaviors may make it more likely for you to have a stroke. Below are ways to lessen your risk of having a stroke.  °· Be active for at least 30 minutes on most or all days. °· Do not smoke. Try not to be around others who smoke. °· Do not drink too much alcohol. °¨ Do not have more than 2 drinks a day if you are a man. °¨ Do not have more than 1 drink a day if you are a woman and are not pregnant. °· Eat healthy foods, such as fruits and vegetables. If you were put on a specific diet, follow the diet as told. °· Keep your cholesterol levels under control through diet and medicines. Look for foods that are low in saturated fat, trans fat, cholesterol, and are high in fiber. °· If you have diabetes, follow all diet plans and take your medicine as told. °· Ask your doctor if you need treatment to lower your blood pressure. If you have high blood pressure (hypertension), follow all diet plans and take your medicine as told by your doctor. °· If you are 18-39 years old, have your blood pressure checked every 3-5 years. If you are age 40 or older, have your blood pressure checked every year. °· Keep a healthy weight. Eat foods that are low in calories, salt, saturated fat, trans fat, and cholesterol. °· Do not take drugs. °· Avoid birth control pills, if this applies. Talk to your doctor about the risks of taking birth control pills. °· Talk to your doctor if you have sleep problems (sleep apnea). °· Take all medicine as told by your doctor. °¨ You may be told to take aspirin or blood thinner medicine. Take this medicine as told by your doctor. °¨ Understand your medicine instructions. °· Make sure any other conditions you have are being taken care of. °GET HELP RIGHT AWAY IF: °· You suddenly lose feeling (you feel numb) or have weakness in your face, arm, or leg. °· Your face or eyelid hangs down to one side. °· You suddenly feel confused. °· You have trouble talking (aphasia)  or understanding what people are saying. °· You suddenly have trouble seeing in one or both eyes. °· You suddenly have trouble walking. °· You are dizzy. °· You lose your balance or your movements are clumsy (uncoordinated). °· You suddenly have a very bad headache and you do not know the cause. °· You have new chest pain. °· Your heart feels like it is fluttering or skipping a beat (irregular heartbeat). °Do not wait to see if the symptoms above go away. Get help right away. Call your local emergency services (911 in [place] Do not drive yourself to the hospital. °  °This information is not intended to replace advice given to you by your health care provider. Make sure you discuss any questions you have with your health care provider. °  °Document Released: 12/11/2011 Document Revised: 07/02/2014 Document Reviewed: 12/12/2012 °Elsevier Interactive Patient Education ©2016 Elsevier Inc. ° °

## 2015-11-15 DIAGNOSIS — I639 Cerebral infarction, unspecified: Secondary | ICD-10-CM | POA: Insufficient documentation

## 2015-11-15 LAB — CARDIOLIPIN ANTIBODIES, IGG, IGM, IGA
Anticardiolipin IgA: <9 APL U/mL (ref 0–11)
Anticardiolipin IgG: <9 GPL U/mL (ref 0–14)
Anticardiolipin IgM: 10 MPL U/mL (ref 0–12)

## 2015-11-15 LAB — PROTEIN S ACTIVITY: PROTEIN S ACTIVITY: 66 % (ref 63–140)

## 2015-11-15 LAB — LUPUS ANTICOAGULANT PANEL
DRVVT: 35.6 s (ref 0.0–47.0)
PTT LA: 35.6 s (ref 0.0–43.6)

## 2015-11-15 LAB — HEMOGLOBIN A1C
HEMOGLOBIN A1C: 5.8 % — AB (ref 4.8–5.6)
MEAN PLASMA GLUCOSE: 120 mg/dL

## 2015-11-15 LAB — PROTEIN S, TOTAL: PROTEIN S AG TOTAL: 107 % (ref 60–150)

## 2015-11-15 LAB — HOMOCYSTEINE: Homocysteine: 5.3 umol/L (ref 0.0–15.0)

## 2015-11-15 LAB — PROTEIN C ACTIVITY: PROTEIN C ACTIVITY: 143 % (ref 73–180)

## 2015-11-15 NOTE — Telephone Encounter (Signed)
Rn call patient and gave her Dr. Roda ShuttersXu message. Rn stated per Dr. Roda ShuttersXu message her CT angiogram can wait until July 2017 at her next appt in July 25. PT verbalized understanding.

## 2015-11-15 NOTE — Telephone Encounter (Signed)
Yes, two months follow up, not 2-3 weeks. At 2 month follow up, we will repeat CT angiogram of the neck to evaluate the dissection. Please let the pt know. Thanks.  Marvel PlanJindong Chyanne Kohut, MD PhD Stroke Neurology 11/15/2015 12:44 AM

## 2015-11-16 LAB — PROTEIN C, TOTAL: Protein C, Total: 104 % (ref 60–150)

## 2015-11-16 LAB — BETA-2-GLYCOPROTEIN I ABS, IGG/M/A

## 2015-11-22 LAB — PROTHROMBIN GENE MUTATION

## 2015-11-22 LAB — FACTOR 5 LEIDEN

## 2015-11-24 ENCOUNTER — Encounter: Payer: Self-pay | Admitting: Physical Therapy

## 2015-11-24 ENCOUNTER — Ambulatory Visit: Payer: 59 | Attending: Internal Medicine | Admitting: Physical Therapy

## 2015-11-24 DIAGNOSIS — R42 Dizziness and giddiness: Secondary | ICD-10-CM | POA: Diagnosis not present

## 2015-11-24 DIAGNOSIS — R2689 Other abnormalities of gait and mobility: Secondary | ICD-10-CM | POA: Insufficient documentation

## 2015-11-24 NOTE — Therapy (Signed)
[facility] [address] [place], [place], [address] Phone: [phone]   Fax:  [phone]  Physical Therapy Evaluation  Patient Details  Name: Marilyn Black MRN: [id number] Date of Birth: Dec 21, 1980 Referring Provider: Marvel PlanJindong Xu, MD, PhD (Dr. Roda ShuttersXu, per pt request. Hospitalist was Eddie NorthHUNGEL, NISHANT MD)  Encounter Date: 11/24/2015      PT End of Session - 11/24/15 1121    Visit Number 1   Number of Visits 5  eval + 4 visits   Date for PT Re-Evaluation 12/24/15   Authorization Type [employer]   Authorization Time Period 60 visit limit combined   PT Start Time 1027  Pt arrived late to evaluation   PT Stop Time 1106   PT Time Calculation (min) 39 min   Activity Tolerance Patient tolerated treatment well;Other (comment)  Limited by onset of headache with vestib eval   Behavior [facility] for tasks assessed/performed      Past Medical History  Diagnosis Date  . Nocturnal epilepsy (HCC)   . Thalassemia minor   . Postpartum care following vaginal delivery 01/09/2011  . Seizure disorder (HCC) 01/09/2011  . Gestational diabetes mellitus (GDM), antepartum   . Thalassemia trait   . Gestational diabetes     diet controlled  . Thalassemia     Past Surgical History  Procedure Laterality Date  . Wisdom tooth extraction  2002    There were no vitals filed for this visit.       Subjective Assessment - 11/24/15 1032    Subjective "I'm here because I  had a stroke. The artery dissected in my neck. The doctor said I had a stroke in both sides of the cerebellum, but mainly on the left." Pt reports nothing is "really different, but I was just pregnant for 9 months."  Pt reports increased L occipital headaches due to looking down while nursing newborn 8-9 times per day.   Pertinent History PMH significant for: epilepsy, gestational DM, panic disorder   Patient Stated Goals "I just want to make sure I'm okay."   Currently  in Pain? No/denies  Pt reports intermittent L occipital headaches; none at ths time            [facility] PT Assessment - 11/24/15 0001    Assessment   Medical Diagnosis Cerebellar infarct; vertebral artery dissection   Referring Provider Marvel PlanJindong Xu, MD, PhD  Dr. Roda ShuttersXu, per pt request. Hospitalist was Eddie NorthHUNGEL, NISHANT MD   Onset Date/Surgical Date 11/13/15   Hand Dominance Right   Precautions   Precautions None   Restrictions   Weight Bearing Restrictions No   Balance Screen   Has the patient fallen in the past 6 months No   Has the patient had a decrease in activity level because of a fear of falling?  No   Is the patient reluctant to leave their home because of a fear of falling?  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   Functional Gait  Assessment   Gait assessed  Yes   Gait Level Surface Walks 20 ft in less than 5.5 sec, no assistive devices, good speed, no evidence for imbalance, normal gait pattern, deviates no more than 6 in outside of the 12 in walkway width.   Change in Gait Speed Able to smoothly change walking speed without loss of balance or gait deviation. Deviate no more than 6 in outside of the 12 in walkway width.   Gait with Horizontal Head Turns  Performs head turns smoothly with slight change in gait velocity (eg, minor disruption to smooth gait path), deviates 6-10 in outside 12 in walkway width, or uses an assistive device.   Gait with Vertical Head Turns Performs task with slight change in gait velocity (eg, minor disruption to smooth gait path), deviates 6 - 10 in outside 12 in walkway width or uses assistive device  decreased ROM   Gait and Pivot Turn Pivot turns safely in greater than 3 sec and stops with no loss of balance, or pivot turns safely within 3 sec and stops with mild imbalance, requires small steps to catch balance.  disequilibrium and mild LOB with effective self-recovery   Step Over Obstacle Is able to step over 2 stacked shoe boxes taped together (9 in total height) without changing gait speed. No evidence of imbalance.   Gait with Narrow Base of Support Is able to ambulate for 10 steps heel to toe with no staggering.   Gait with Eyes Closed Walks 20 ft, slow speed, abnormal gait pattern, evidence for imbalance, deviates 10-15 in outside 12 in walkway width. Requires more than 9 sec to ambulate 20 ft.  very slow gait speed; disequilibrium   Ambulating Backwards Walks 20 ft, no assistive devices, good speed, no evidence for imbalance, normal gait   Steps Alternating feet, must use rail.   Total Score 24   FGA comment: 19-24 indicated medium fall risk            Vestibular Assessment - 11/24/15 0001    Symptom Behavior   Type of Dizziness "Funny feeling in head"   Frequency of Dizziness daily; multiple times per day   Duration of Dizziness seconds   Aggravating Factors Looking up to the ceiling;Turning body quickly;Turning head quickly;Forward bending   Relieving Factors Head stationary;Rest  and gaze fixation   Occulomotor Exam   Occulomotor Alignment Normal   Spontaneous Absent   Gaze-induced Absent   Smooth Pursuits Intact   Saccades Dysmetria  L eye inconsistently hypermetric; "some strain," per pt    Comment convergence abnormal in L eye   Vestibulo-Occular Reflex   VOR 1 Head Only (x 1 viewing) "Feels like I might get dizzy" with horizontal head movement, per pt. Vertical appears grossly WNL and is asymptomatic.  onset of headache after attempting VOR   VOR Cancellation Normal  and asymptomatic               [facility] Adult PT Treatment/Exercise - 11/24/15 0001    Ambulation/Gait   Ambulation/Gait Yes   Ambulation/Gait Assistance 5: Supervision;7: Independent   Ambulation/Gait Assistance Details Supervision with aspects of dynamic/high level gait. See FGA for details.   Ambulation Distance (Feet) 300 Feet  Assistive device None   Gait Pattern Step-through pattern  turns en bloc; minimal head movement   Ambulation Surface Level;Indoor   Stairs Yes   Stairs Assistance 7: Independent   Stair Management Technique One rail Right;Alternating pattern;Forwards   Number of Stairs 4   Height of Stairs 6   Neuro Re-ed    Neuro Re-ed Details  Explained, demonstrated, and provided paper handout for convergence exercise. Pt gave effective return demo.         Vestibular Treatment/Exercise - 11/24/15 0001    Vestibular Treatment/Exercise   Vestibular Treatment Provided Gaze   Gaze Exercises X1 Viewing Horizontal   X1 Viewing Horizontal   Foot Position standing; shoulder width   Reps 2   Comments 2 x30-second trials with cueing for technique, effective return demo               PT Education - 11/24/15 1108    Education provided Yes   Education Details PT eval findings, goals, and POC. Explained and demonstrated compensatory strategy for impaired VOR during mobility. Initiated HEP for convergence and gaze stabilization.    Person(s) Educated Patient   Methods Explanation;Demonstration;Handout;Verbal cues   Comprehension Returned demonstration;Verbalized understanding          PT Short Term Goals - 11/24/15 1139    PT SHORT TERM GOAL #1   Title STG's = LTG's            PT Long Term Goals - 11/24/15 1139    PT LONG TERM GOAL #1   Title Pt will independently perform HEP to maximize functional gains made in PT.  (Target date: 12/22/15)   PT LONG TERM GOAL #2   Title Pt will improve FGA score from 24/30 to 30/30 to indicate significant improvement in dynamic gait stability.    PT LONG TERM GOAL #3   Title Pt will ambulate 51' with concurrent horizontal head turns with no overt LOB to indicate increased safety scanning grocery store aisle.    PT LONG TERM GOAL #4   Title Pt will perform all above activities with no more than 2-point increase in headache pain to indicate improved tolerance to functional mobility.   PT LONG TERM GOAL #5   Title SIS Mobility score will increase from 25% to >/= 40% to indicate significant decrease in pt-perceived disability due to CVA.                Plan - 11/24/15 1134    Clinical Impression Statement Pt is a 35 y/o F referred to outpatient neuro PT to address functional impairments associated with cerebellar infact due to vertebral artery dissection 11/13/15 with associated hospitalization from 5/21-5/22/17. PMH significant for: epilepsy, gestational DM, panic disorder. Pt evaluation reveals the following: impaired convergence; impaired VOR;  impaired oculomotor coordination; dizziness and associated headache; FGA score suggestive of medium fall risk. Pt will benefit from skilled outpaitent PT 1x/week for 4 weeks to address said impairments.    Rehab Potential Good   Clinical Impairments Affecting Rehab Potential age, patient motivation   PT Frequency 1x / week   PT Duration 4 weeks   PT Treatment/Interventions ADLs/Self Care Home Management;Vestibular;Gait training;Stair training;Neuromuscular re-education;Functional mobility training;Therapeutic activities;Patient/family education;Therapeutic exercise;Balance training;Visual/perceptual remediation/compensation   PT Next Visit Plan Assess current HEP and progress  prn. Add corrective saccades, gait with horizontal head turns, habituation (for bending over). Revisit compensatory strategies for impaired VOR during gait.   Consulted and Agree with Plan of Care Patient  Patient will benefit from skilled therapeutic intervention in order to improve the following deficits and impairments:  Abnormal gait, Dizziness, Decreased balance, Pain  Visit Diagnosis: Dizziness and giddiness - Plan: PT plan of care cert/re-cert  Other abnormalities of gait and mobility - Plan: PT plan of care cert/re-cert     Problem List Patient Active Problem List   Diagnosis Date Noted  . Stroke (cerebrum) (HCC)   . Postpartum hemorrhage of vagina 11/14/2015  . Hyperlipidemia with target LDL less than 70 11/14/2015  . Vertebral artery dissection (HCC) 11/13/2015  . Neck pain 11/13/2015  . 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 SVD (spontaneous vaginal delivery) 10/06/2015  . Postpartum care following vaginal delivery (4/13) 10/06/2015  . Postpartum state 10/06/2015  . Labor and delivery, indication for care 10/05/2015  . Seizure disorder (HCC) 01/09/2011   Jorje Guild, PT, DPT [facility] [address] [place], [place], [address] Phone: [phone]   Fax:  [phone] 11/24/2015, 11:47 AM  Name: LANEKA MCGRORY MRN: [id number] Date of Birth: Apr 04, 1981

## 2015-11-24 NOTE — Patient Instructions (Addendum)
Pen Push-ups 1. Hold a pen on front of you at arm's length. The pen should be vertical, with the tip at the top.The pen should be directly in front of your face, with the tip just below eye level.  2. Move the pencil slowly toward your face (all the way to your nose) as you concentrate and focus on the point. Soon you'll notice that you see two pens rather than one OR the pen begins shaking. Stop.  3. Look away from the pen briefly to rest your eyes. Focus on something across the room for two or three seconds, and then look back at the pen point where you've stopped it close to your face. Look at the pen point carefully, and to try to focus so that the double vision Or shaking disappears and you only see one, still pen.  4. Move the pen back out to arm's length when you are able to rid yourself of double vision. If this takes more than a few seconds, look away and try again. Once you are able to accomplish it, move the pen back out to arm's length and complete the exercise again.  Try to perform this exercise 5-10 minutes per day.    Gaze Stabilization: Tip Card  1.Target must remain in focus, not blurry, and appear stationary while head is in motion. 2.Perform exercises with small head movements (45 to either side of midline). 3.Increase speed of head motion so long as target is in focus. 4.Wear your glasses.  Gaze Stabilization: Standing Feet Apart    Feet shoulder width apart, keeping eyes on target on wall __5__ feet away, tilt head down slightly and move head side to side for _30___ seconds.  Do __2__ sessions per day.   * With all exercises, keep symptoms (including headache) < 5/10. If symptoms exceed 4/10, stop and rest until symptoms settle.

## 2015-12-01 ENCOUNTER — Ambulatory Visit: Payer: 59 | Admitting: Physical Therapy

## 2015-12-01 DIAGNOSIS — R2689 Other abnormalities of gait and mobility: Secondary | ICD-10-CM

## 2015-12-01 DIAGNOSIS — R42 Dizziness and giddiness: Secondary | ICD-10-CM | POA: Diagnosis not present

## 2015-12-01 NOTE — Therapy (Signed)
[facility] [address] [place], [place], [address] Phone: [phone]   Fax:  [phone]  Physical Therapy Treatment  Patient Details  Name: Marilyn Black MRN: [id number] Date of Birth: 09-Oct-1980 Referring Provider: Rosalin Hawking, MD, PhD (Dr. Erlinda Hong, per pt request. Hospitalist was Louellen Molder MD)  Encounter Date: 12/01/2015      PT End of Session - 12/01/15 1641    Visit Number 2   Number of Visits 5   Date for PT Re-Evaluation 12/24/15   Authorization Type U[employer]   Authorization Time Period 60 visit limit combined   Authorization - Visit Number 1   Authorization - Number of Visits 60   PT Start Time 0845   PT Stop Time 0929   PT Time Calculation (min) 44 min   Activity Tolerance Patient tolerated treatment well;Other (comment)  Required active rest breaks (walking) due to nausea, disequilibrium with exercises   Behavior During Therapy [facility] for tasks assessed/performed      Past Medical History  Diagnosis Date  . 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 Thalassemia minor   . Postpartum care following vaginal delivery 01/09/2011  . Seizure disorder ([place]) 01/09/2011  . Gestational diabetes mellitus (GDM), antepartum   . Thalassemia trait   . Gestational diabetes     diet controlled  . Thalassemia     Past Surgical History  Procedure Laterality Date  . Wisdom tooth extraction  2002    There were no vitals filed for this visit.      Subjective Assessment - 12/01/15 0848    Subjective "The eye exercises obviously are pretty hard and sometimes they give me a headache, but they're getting easier and overall, it's better." Pt has no headache pre-session today.   Pertinent History PMH significant for: epilepsy, gestational DM, panic disorder   Patient Stated Goals "I just want to make sure I'm okay."   Currently in Pain? No/denies                         [facility] Adult PT Treatment/Exercise - 12/01/15  0001    Ambulation/Gait   Ambulation/Gait Yes   Ambulation/Gait Assistance 6: Modified independent (Device/Increase time);5: Supervision   Ambulation/Gait Assistance Details Supervision for aspects of dynamic gait (see Balance section for details); mod I for linear gait (no head turns) over level and unlevel surfaces. Gait x1,000' as active recovery due to nausea/headache with balance exercise with eyes closed.   Ambulation Distance (Feet) 1300 Feet  x1000' over unlevel, paved surfaces   Assistive device None   Gait Pattern Step-through pattern  turns en bloc; minimal head movement   Ambulation Surface Level;Unlevel;Indoor;Outdoor;Paved   Neuro Re-ed    Neuro Re-ed Details  Reviewed pen push-ups, which pt performed x2 reps with effective between-session carryover. Standing without UE support, pt performed the following: compensatory saccades x20 reps with visual targets 12" apart; 360- degree turns 2 x5 reps to R side (increased disequilibrium, instability to R as compared with L), x3 reps to L side, both performed with visual spotting.  In seated, performed L monocular visual tracking toward/across midline, which caused significant disequilibrium after short time (< 1 minute) of practice.         Vestibular Treatment/Exercise - 12/01/15 0001    Vestibular Treatment/Exercise   Vestibular Treatment Provided Gaze   Gaze Exercises X1 Viewing Horizontal;X1 Viewing Vertical   X1 Viewing Horizontal   Foot Position standing; shoulder width   Reps  2   Comments 2 x30-second trials with cueing for technique, effective return demo   X1 Viewing Vertical   Foot Position standing; shoulder width   Reps 2   Comments 2 x30-sec trials with cueing for technique            Balance Exercises - 12/01/15 1640    Balance Exercises: Standing   Standing Eyes Closed Wide (BOA);Solid surface;3 reps;20 secs  nausea after 3rd rep   Gait with Head Turns Forward;Other (comment)  x25' with horizontal, x25'  with vertical head turns; HA inc   Retro Gait Other (comment)  x50' with no limitations, no LOB           PT Education - 12/01/15 1250    Education provided Yes   Education Details HEP reviewed/progressed. See Pt Instructions.   Person(s) Educated Patient   Methods Explanation;Demonstration;Handout   Comprehension Verbalized understanding;Returned demonstration          PT Short Term Goals - 11/24/15 1139    PT SHORT TERM GOAL #1   Title STG's = LTG's           PT Long Term Goals - 12/01/15 1642    PT LONG TERM GOAL #1   Title Pt will independently perform HEP to maximize functional gains made in PT.  (Target date: 12/22/15)   Baseline Met 6/8.   Status Achieved   PT LONG TERM GOAL #2   Title Pt will improve FGA score from 24/30 to 30/30 to indicate significant improvement in dynamic gait stability.    PT LONG TERM GOAL #3   Title Pt will ambulate 12' with concurrent horizontal head turns with no overt LOB to indicate increased safety scanning grocery store aisle.    PT LONG TERM GOAL #4   Title Pt will perform all above activities with no more than 2-point increase in headache pain to indicate improved tolerance to functional mobility.   PT LONG TERM GOAL #5   Title SIS Mobility score will increase from 25% to >/= 40% to indicate significant decrease in pt-perceived disability due to CVA.                Plan - 12/01/15 1643    Clinical Impression Statement Session focused on assessing/progressing current HEP; promoting adaptation to central vestibular impairments; and educating pt on compensatory strategies to increase pt tolerance to mobility.  Pt tolerated interventions well but did have increased headache after attempting L monocular visual tracking (toward/across midline); also noted increased nausea after attempting static standing with EC, feet apart on solid surface. Pt did appear to benefit from ambulation for active rest break, as nausea and headache  subsided during gait x1,000' outdoors.    Rehab Potential Good   Clinical Impairments Affecting Rehab Potential age, patient motivation   PT Frequency 1x / week   PT Duration 4 weeks   PT Treatment/Interventions ADLs/Self Care Home Management;Vestibular;Gait training;Stair training;Neuromuscular re-education;Functional mobility training;Therapeutic activities;Patient/family education;Therapeutic exercise;Balance training;Visual/perceptual remediation/compensation   PT Next Visit Plan Assess current HEP and progress prn. Consider adding gait with head turns and EC on with feet apart on solid surface.    Consulted and Agree with Plan of Care Patient      Patient will benefit from skilled therapeutic intervention in order to improve the following deficits and impairments:  Abnormal gait, Dizziness, Decreased balance, Pain  Visit Diagnosis: Dizziness and giddiness  Other abnormalities of gait and mobility     Problem List Patient Active Problem List  Diagnosis Date Noted  . Stroke (cerebrum) ([place])   . Postpartum hemorrhage of vagina 11/14/2015  . Hyperlipidemia with target LDL less than 70 11/14/2015  . Vertebral artery dissection ([place]) 11/13/2015  . Neck pain 11/13/2015  . 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 SVD (spontaneous vaginal delivery) 10/06/2015  . Postpartum care following vaginal delivery (4/13) 10/06/2015  . Postpartum state 10/06/2015  . Labor and delivery, indication for care 10/05/2015  . Seizure disorder ([place]) 01/09/2011    Billie Ruddy, PT, [place] [address] [place], [place], [address] Phone: [phone]   Fax:  [phone] 12/01/2015, 4:48 PM  Name: KIANNI LHEUREUX MRN: [id number] Date of Birth: 1981/02/16

## 2015-12-01 NOTE — Patient Instructions (Signed)
Pen Push-ups 1. Hold a pen on front of you at arm's length. The pen should be vertical, with the tip at the top.The pen should be directly in front of your face, with the tip just below eye level.  2. Move the pencil slowly toward your face (all the way to your nose) as you concentrate and focus on the point. Soon you'll notice that you see two pens rather than one OR the pen begins shaking. Stop.  3. Look away from the pen briefly to rest your eyes. Focus on something across the room for two or three seconds, and then look back at the pen point where you've stopped it close to your face. Look at the pen point carefully, and to try to focus so that the double vision Or shaking disappears and you only see one, still pen.  4. Move the pen back out to arm's length when you are able to rid yourself of double vision. If this takes more than a few seconds, look away and try again. Once you are able to accomplish it, move the pen back out to arm's length and complete the exercise again.  Try to perform this exercise 5-10 minutes per day.  Horizontal Tracking - Left Eye 1. Hold pen/finger at arm's length. Cover/close  2. Follow the pen with your LEFT eye as you move the pen to the left (toward/past your nose). Try to keep the pen in focus. Practice this exercise for 2-3 minutes at a time (as tolerated).   Compensatory Strategies: Corrective Saccades    1. Holding two stationary targets placed _8-12__ inches apart, move eyes to target, keep head still. 2. Then move head in direction of target while eyes remain on target. 3/4. Repeat in opposite direction. Perform sitting. Repeat sequence _10-15___ times per session. Do __2__ sessions per day.   Gaze Stabilization: Tip Card  1.Target must remain in focus, not blurry, and appear stationary while head is in motion. 2.Perform exercises with small head movements (45 to either side of midline). 3.Increase speed of head motion so long as target is in  focus. 4.Wear your glasses.  Gaze Stabilization: Standing Feet Apart    Feet shoulder width apart, keeping eyes on target on wall __5__ feet away, tilt head down slightly and move head side to side for _30___ seconds.  Repeat the same exercise while moving head up and down for __30__ seconds. Do __2__ sessions per day.  Turning in Place: Compensatory Strategy    Standing in place, first move eyes to target at eye level located to right side. Keeping eyes on target, turn head then body toward target. Repeat sequence with target on each wall to complete full turn. Repeat __10__ times per session. Do __1-2__ sessions per day.  * With all exercises, keep symptoms (including headache) < 5/10. If symptoms exceed 4/10, stop and rest until symptoms settle.

## 2015-12-06 ENCOUNTER — Ambulatory Visit: Payer: 59 | Admitting: Physical Therapy

## 2015-12-06 DIAGNOSIS — R42 Dizziness and giddiness: Secondary | ICD-10-CM | POA: Diagnosis not present

## 2015-12-06 DIAGNOSIS — R2689 Other abnormalities of gait and mobility: Secondary | ICD-10-CM

## 2015-12-06 NOTE — Therapy (Signed)
[facility] [address] [place], [place], [address] Phone: [phone]   Fax:  [phone]  Physical Therapy Treatment  Patient Details  Name: Marilyn Black MRN: [id number] Date of Birth: 07-29-1980 Referring Provider: Marvel Plan, MD, PhD (Dr. Roda Shutters, per pt request. Hospitalist was Eddie North MD)  Encounter Date: 12/06/2015      PT End of Session - 12/06/15 0944    Visit Number 3   Number of Visits 5   Date for PT Re-Evaluation 12/24/15   Authorization Type UHC   Authorization Time Period 60 visit limit combined   Authorization - Visit Number 2   Authorization - Number of Visits 60   PT Start Time 0845   PT Stop Time 0921  Pt requested to leave early due to pt's baby being ill   PT Time Calculation (min) 36 min   Activity Tolerance Patient tolerated treatment well;Other (comment)  active rest breaks due to disequilibrium, low-grade headache   Behavior During Therapy [facility] for tasks assessed/performed      Past Medical History  Diagnosis Date  . Nocturnal epilepsy ([facility])   . Thalassemia minor   . Postpartum care following vaginal delivery 01/09/2011  . Seizure disorder ([facility]) 01/09/2011  . Gestational diabetes mellitus (GDM), antepartum   . Thalassemia trait   . Gestational diabetes     diet controlled  . Thalassemia     Past Surgical History  Procedure Laterality Date  . Wisdom tooth extraction  2002    There were no vitals filed for this visit.      Subjective Assessment - 12/06/15 0849    Subjective "The eye exercises are hard. Especially the one where my right eye is closed."   Pertinent History PMH significant for: epilepsy, gestational DM, panic disorder   Patient Stated Goals "I just want to make sure I'm okay."   Currently in Pain? No/denies                         [facility] Adult PT Treatment/Exercise - 12/06/15 0001    Ambulation/Gait   Ambulation/Gait Yes    Ambulation/Gait Assistance 6: Modified independent (Device/Increase time)   Ambulation/Gait Assistance Details Gait trials between trials of visual/vestibular exercises due to increased dizziness, disequilibrium, headache. During gait trials, educated pt on ways to progress current HEP (increased # of reps, then progressing to more narrow BOS).   Ambulation Distance (Feet) 345 Feet  115' x3   Assistive device None   Gait Pattern Step-through pattern  Continued to demo minimal head movement during gait   Ambulation Surface Level;Indoor   Neuro Re-ed    Neuro Re-ed Details  Reviewed compensatory saccades x10 reps with visual targets 12" apart with effective between-session carryover, decreased time for vision to refocus between each rep. Pt mistakenly did not perform 360- degree turns on HEP this past week. Therefore, performed 180-degree turns x5 reps to R side only with visual spotting; then performed 360-degree turning to R x5 reps with visual spotting at each  turn; progressed to 360-degree turns to R x10 reps with visual spotting at each  turn x10 reps consecutively prior to onset of 3/10 symptoms. In seated, performed L monocular visual tracking toward/across midline, which caused mild headache after practice x1 minute. With PT instruction, pt transitioned from L monocular to biocular visual tracking to R of midline x1 minute; this did seem to mitigate symptoms of disequilibrium and headache. See Balance section for additional  details.             Balance Exercises - 12/06/15 0942    Balance Exercises: Standing   Standing Eyes Closed Wide (BOA);Foam/compliant surface;Solid surface;Head turns;10 secs;5 reps  See comments below for details   Other Standing Exercises Corner balance: progressed from static standing with EC, wide BOS on solid surface with BUE support x10 sec > single UE support x10 sec > without UE support x20 seconds with no symptoms. Therefore, pt progressed to static  standing with EC on 1 pillow x20 seconds with no symptoms. Finally, progressed to standing on 1 pillow with wide BOS, EC with horizontal head turns x5 reps, vertical head turns x5 reps. Final exercise did evoke 3/10 symptoms and mild headache. However, pt able to recover from symptoms within 30 seconds; headache persisted but was low-grade.           PT Education - 12/06/15 0933    Education provided Yes   Education Details Progressed vestibular HEP; see Pt Instructions.    Person(s) Educated Patient   Methods Explanation;Demonstration;Handout;Verbal cues   Comprehension Returned demonstration;Verbalized understanding          PT Short Term Goals - 11/24/15 1139    PT SHORT TERM GOAL #1   Title STG's = LTG's           PT Long Term Goals - 12/01/15 1642    PT LONG TERM GOAL #1   Title Pt will independently perform HEP to maximize functional gains made in PT.  (Target date: 12/22/15)   Baseline Met 6/8.   Status Achieved   PT LONG TERM GOAL #2   Title Pt will improve FGA score from 24/30 to 30/30 to indicate significant improvement in dynamic gait stability.    PT LONG TERM GOAL #3   Title Pt will ambulate 25' with concurrent horizontal head turns with no overt LOB to indicate increased safety scanning grocery store aisle.    PT LONG TERM GOAL #4   Title Pt will perform all above activities with no more than 2-point increase in headache pain to indicate improved tolerance to functional mobility.   PT LONG TERM GOAL #5   Title SIS Mobility score will increase from 25% to >/= 40% to indicate significant decrease in pt-perceived disability due to CVA.                Plan - 12/06/15 0945    Clinical Impression Statement Session continued to focus on promoting adaptation to central vestibular impairments, continued education/practice of compensatory strategies to increase pt tolerance to functional mobility/activities. Whereas pt was unable to tolerate static standing with  eyes closed on solid surface with wide BOS at last session, pt was able to perform standing on pillow with EC and head movement during this session. Pt exhibits excellent motivation and compliance with PT and is progressing toward goals.    Rehab Potential Good   Clinical Impairments Affecting Rehab Potential age, patient motivation   PT Frequency 1x / week   PT Duration 4 weeks   PT Treatment/Interventions ADLs/Self Care Home Management;Vestibular;Gait training;Stair training;Neuromuscular re-education;Functional mobility training;Therapeutic activities;Patient/family education;Therapeutic exercise;Balance training;Visual/perceptual remediation/compensation   PT Next Visit Plan Progress corner balance HEP. Add gait with head turns to HEP.   Consulted and Agree with Plan of Care Patient      Patient will benefit from skilled therapeutic intervention in order to improve the following deficits and impairments:  Abnormal gait, Dizziness, Decreased balance, Pain  Visit Diagnosis: Dizziness  and giddiness  Other abnormalities of gait and mobility     Problem List Patient Active Problem List   Diagnosis Date Noted  . Stroke (cerebrum) ([place])   . Postpartum hemorrhage of vagina 11/14/2015  . Hyperlipidemia with target LDL less than 70 11/14/2015  . Vertebral artery dissection ([place]) 11/13/2015  . Neck pain 11/13/2015  . 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 SVD (spontaneous vaginal delivery) 10/06/2015  . Postpartum care following vaginal delivery (4/13) 10/06/2015  . Postpartum state 10/06/2015  . Labor and delivery, indication for care 10/05/2015  . Seizure disorder ([place]) 01/09/2011   Billie Ruddy, PT, DPT [facility] [address] [place], [place], [address] Phone: [phone]   Fax:  [phone] 12/06/2015, 9:48 AM  Name: Marilyn Black MRN: [id number] Date of Birth: 06-08-1981

## 2015-12-06 NOTE — Patient Instructions (Addendum)
Pen Push-ups 1. Hold a pen on front of you at arm's length. The pen should be vertical, with the tip at the top.The pen should be directly in front of your face, with the tip just below eye level.  2. Move the pencil slowly toward your face (all the way to your nose) as you concentrate and focus on the point. Soon you'll notice that you see two pens rather than one OR the pen begins shaking. Stop.  3. Look away from the pen briefly to rest your eyes. Focus on something across the room for two or three seconds, and then look back at the pen point where you've stopped it close to your face. Look at the pen point carefully, and to try to focus so that the double vision Or shaking disappears and you only see one, still pen.  4. Move the pen back out to arm's length when you are able to rid yourself of double vision. If this takes more than a few seconds, look away and try again. Once you are able to accomplish it, move the pen back out to arm's length and complete the exercise again.  Try to perform this exercise 5-10 minutes per day.   Horizontal Tracking - Left Eye 1. Hold pen/finger at arm's length. Cover/close  2. Follow the pen with your LEFT eye as you move the pen to the left (toward/past your nose). Try to keep the pen in focus. Practice this exercise for 2-3 minutes at a time (as tolerated). Then, incorporate the RIGHT eye into the exercise (use both eyes).   Compensatory Strategies: Corrective Saccades    1. Holding two stationary targets placed _8-12__ inches apart, move eyes to target, keep head still. 2. Then move head in direction of target while eyes remain on target. 3/4. Repeat in opposite direction. Perform sitting. Repeat sequence _10-15___ times per session. Do __2__ sessions per day.   Gaze Stabilization: Tip Card  1.Target must remain in focus, not blurry, and appear stationary while head is in motion. 2.Perform exercises with small head movements (45 to either side  of midline). 3.Increase speed of head motion so long as target is in focus. 4.Wear your glasses.  Gaze Stabilization: Standing Feet Apart    Feet shoulder width apart, keeping eyes on target on wall __5__ feet away, tilt head down slightly and move head side to side for _45___ seconds.  Repeat the same exercise while moving head up and down for __45__ seconds. Do __2__ sessions per day.   Turning in Place: Compensatory Strategy    Place one "A" directly in front of you at about eye-level; place other "A" behind you. Turn 360 degrees in one place, spotting visual targets ("A") halfway through the turn and at the end of the turn. Repeat sequence with target on each wall to complete full turn. Repeat __10__ times per session. Do __1-2__ sessions per day.   Feet Apart (Compliant Surface) Head Motion - Eyes Closed    Stand with your back to a corner with a stable chair in front of you. Stand on 1 pillow with your feet shoulder width apart. Close eyes and move head slowly: up and down 5 times; right to left 5 times.  Perform __2__ sessions per day, as tolerated. Progress by first increasing number of head turns from 5 to 10 reps. Then, progress by bringing feet closer together.   * With all exercises, keep symptoms (including headache) < 5/10. If symptoms exceed 4/10, stop and  rest until symptoms settle.

## 2015-12-14 ENCOUNTER — Telehealth: Payer: Self-pay | Admitting: Neurology

## 2015-12-14 ENCOUNTER — Telehealth (HOSPITAL_COMMUNITY): Payer: Self-pay

## 2015-12-14 NOTE — Telephone Encounter (Signed)
Mom is taking 325mg  aspirin daily post op and to prevent stroke.  Medications and Mothers Milk 2014 by Herma Ardhomas Hales indicates Aspirin as an L2, but with caution to [facility] syndrome in pediatrics.  Further explains it to be out of moms system in 2 hours, due to rapid metabolization, and with feeding 2-3 hours later likely not transferred in breastmilk.  Mom is taking medicine at night and baby is sleeping 4-8 hours.  Mom will plan to take med after last feeding at night.

## 2015-12-14 NOTE — Telephone Encounter (Signed)
Pt called said she will be returning to work 12/28/15. 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 She has been getting dizzy and HA's for about the past couple of weeks, mostly the past week. She has been doing more activities the past week-going out more with the baby, shopping. She said turning her head makes her dizzy. PT said that would be normal with her condition but she is thinking this could have some impact on her job. Her job is requesting a letter stating how her condition will effect her responsibilities, are there any restriction, etc. Please call

## 2015-12-15 ENCOUNTER — Ambulatory Visit: Payer: 59 | Admitting: Physical Therapy

## 2015-12-15 DIAGNOSIS — R2689 Other abnormalities of gait and mobility: Secondary | ICD-10-CM

## 2015-12-15 DIAGNOSIS — R42 Dizziness and giddiness: Secondary | ICD-10-CM

## 2015-12-15 NOTE — Patient Instructions (Addendum)
*   With all exercises, keep symptoms (including headache) < 5/10. If symptoms exceed 4/10 (even if you're in the middle of the set) stop and rest until symptoms settle.  Gaze Stabilization: Tip Card  1.Target must remain in focus, not blurry, and appear stationary while head is in motion. 2.Perform exercises with small head movements (45 to either side of midline). 3.Increase speed of head motion so long as target is in focus. 4.Wear your glasses.  Gaze Stabilization: Standing Feet Apart    Feet shoulder width apart, keeping eyes on target on wall __4__ feet away, tilt head down slightly and move head side to side for _30___ seconds.  Repeat the same exercise while moving head up and down for __30__ seconds. Do __2__ sessions per day.   Turning in Place: Compensatory Strategy    Place one "A" directly in front of you at about eye-level and 5 feet away. Turn 360 degrees in one place, spotting visual targets ("A") halfway through the turn and at the end of the turn. Repeat sequence with target on each wall to complete full turn. Repeat __10__ times per session. Do __1-2__ sessions per day.   Feet Apart (Compliant Surface) Head Motion - Eyes Closed    Stand with your back to a corner with a stable chair in front of you. Stand on 1 pillow with your feet shoulder width apart. Close eyes and move head slowly: up and down 5 times; right to left 5 times.  Perform __2__ sessions per day, as tolerated. Progress by first increasing number of head turns from 5 to 10 reps. Then, progress by bringing feet closer together.  Pen Push-ups 1. Hold a pen on front of you at arm's length. The pen should be vertical, with the tip at the top.The pen should be directly in front of your face, with the tip just below eye level.  2. Move the pencil slowly toward your face (all the way to your nose) as you concentrate and focus on the point. Soon you'll notice that you see two pens rather than one OR the pen  begins shaking. Stop.  3. Look away from the pen briefly to rest your eyes. Focus on something across the room for two or three seconds, and then look back at the pen point where you've stopped it close to your face. Look at the pen point carefully, and to try to focus so that the double vision Or shaking disappears and you only see one, still pen.  4. Move the pen back out to arm's length when you are able to rid yourself of double vision. If this takes more than a few seconds, look away and try again. Once you are able to accomplish it, move the pen back out to arm's length and complete the exercise again.  Try to perform this exercise 5-10 minutes per day.

## 2015-12-15 NOTE — Therapy (Signed)
[facility] [address] [place], [place], [address] Phone: [phone]   Fax:  [phone]  Physical Therapy Treatment  Patient Details  Name: Marilyn Black MRN: [id number] Date of Birth: [birth year] Referring Provider: Rosalin Hawking, MD, PhD (Dr. Erlinda Hong, per pt request. Hospitalist was Louellen Molder MD)  Encounter Date: 12/15/2015      PT End of Session - 12/15/15 1239    Visit Number 4   Number of Visits 5   Date for PT Re-Evaluation 12/24/15   Authorization Type UHC   Authorization Time Period 60 visit limit combined   Authorization - Visit Number 3   Authorization - Number of Visits 60   PT Start Time 4496   PT Stop Time 0933   PT Time Calculation (min) 46 min   Activity Tolerance Patient tolerated treatment well;Other (comment)  active rest breaks (walking) due to dizziness, nausea with vestib/balance exercises   Behavior During Therapy [facility] for tasks assessed/performed      Past Medical History  Diagnosis Date  . 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 Thalassemia minor   . Postpartum care following vaginal delivery 01/09/2011  . Seizure disorder ([place]) 01/09/2011  . Gestational diabetes mellitus (GDM), antepartum   . Thalassemia trait   . Gestational diabetes     diet controlled  . Thalassemia     Past Surgical History  Procedure Laterality Date  . Wisdom tooth extraction  2002    There were no vitals filed for this visit.      Subjective Assessment - 12/15/15 0856    Subjective Patient reports increased headaches over past week. Feels anxious about this due to strong headache in same area as when she experienced vertebral artery dissection. Recently experienced very severe headache after attending dad's 81th birthday party, which involved standing x3 consecutive hours, pt was turning head/body a lot, and in a room with a lot of bright lights.    Pertinent History PMH significant for: epilepsy, gestational DM,  panic disorder   Patient Stated Goals "I just want to make sure I'm okay."   Currently in Pain? No/denies                         [facility] Adult PT Treatment/Exercise - 12/15/15 0001    Ambulation/Gait   Ambulation/Gait Yes   Ambulation/Gait Assistance 6: Modified independent (Device/Increase time)   Ambulation/Gait Assistance Details Gait trials between standing balance exercises as active recovery when dizziness, nausea increased.   Ambulation Distance (Feet) 575 Feet  230' x2 and 1 x115'   Assistive device None   Gait Pattern Step-through pattern  Continued to demo minimal head movement during gait   Ambulation Surface Level;Indoor         Vestibular Treatment/Exercise - 12/15/15 0001    Vestibular Treatment/Exercise   Vestibular Treatment Provided Gaze;Habituation   Habituation Exercises 360 degree Turns   Gaze Exercises X1 Viewing Horizontal;X1 Viewing Vertical   360 degree Turns   Number of Reps  10   Symptom Description  nausea   COMMENT Increased distance from visual target from 3' away to 5' away (to increase pt tolerance to exercise) and emphasized importance of pt letting symptoms settle between each rep prior to continuing.   X1 Viewing Horizontal   Foot Position standing; feet shoulder width *decreased distance to from 5' to 4' away fro visual target   Reps 3   Comments 45-sec trial x1 with pt standing 5' away  from target; 2 x30-sec trials with pt standing 4' away from target. Cueing for decreased velocity of head movement when 4' away   X1 Viewing Vertical   Foot Position standing; feet shoulder width *decreased distance to from 5' to 4' away fro visual target   Reps 2   Comments x45-sec trial standing 5' away; x30-sec trial with pt standing 4' away. Required ambulation trial x115' between trials (4' away) due to dizziness, nausea.            Balance Exercises - 12/15/15 1230    Balance Exercises: Standing   Standing Eyes Closed Wide  (BOA);Foam/compliant surface;Head turns;5 reps;30 secs  1 pillow; static x30 sec; horiz, vert head turns x5 each   Other Standing Exercises Performed ambulation between above corner balance exercises (EC on foam with head turns) due to increased dizziness, nausea.           PT Education - 12/15/15 1229    Education provided Yes   Education Details Progressed HEP; see Pt Instructions. Recommended pt increase activity slowly, keeping in mind that visually stimulating, crowded environments will increase symptoms. Emphasized importance of pt letting symptoms settle between each exercise repetition prior to continuing.   Person(s) Educated Patient   Methods Explanation;Demonstration;Handout;Verbal cues   Comprehension Verbalized understanding;Returned demonstration          PT Short Term Goals - 11/24/15 1139    PT SHORT TERM GOAL #1   Title STG's = LTG's           PT Long Term Goals - 12/01/15 1642    PT LONG TERM GOAL #1   Title Pt will independently perform HEP to maximize functional gains made in PT.  (Target date: 12/22/15)   Baseline Met 6/8.   Status Achieved   PT LONG TERM GOAL #2   Title Pt will improve FGA score from 24/30 to 30/30 to indicate significant improvement in dynamic gait stability.    PT LONG TERM GOAL #3   Title Pt will ambulate 22' with concurrent horizontal head turns with no overt LOB to indicate increased safety scanning grocery store aisle.    PT LONG TERM GOAL #4   Title Pt will perform all above activities with no more than 2-point increase in headache pain to indicate improved tolerance to functional mobility.   PT LONG TERM GOAL #5   Title SIS Mobility score will increase from 25% to >/= 40% to indicate significant decrease in pt-perceived disability due to CVA.                Plan - 12/15/15 1239    Clinical Impression Statement Session focused on progressing HEP with emphasis on adaptation to central vestibular impairments. Emphasized  importance of increasing activity slowly with rest breaks to allow symptoms to settle to avoid cumulative symptoms and resulting headache. Noted improvement in L eye visual tracking across midline and convergence during this session as compared with previous sessions.    Rehab Potential Good   Clinical Impairments Affecting Rehab Potential age, patient motivation   PT Frequency 1x / week   PT Duration 4 weeks   PT Treatment/Interventions ADLs/Self Care Home Management;Vestibular;Gait training;Stair training;Neuromuscular re-education;Functional mobility training;Therapeutic activities;Patient/family education;Therapeutic exercise;Balance training;Visual/perceptual remediation/compensation   PT Next Visit Plan Assess goals and formulate POC (? DC vs. 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 Progress HEP by adding gait with head turns, single limb stance.   Consulted and Agree with Plan  of Care Patient      Patient will benefit from skilled therapeutic intervention in order to improve the following deficits and impairments:  Abnormal gait, Dizziness, Decreased balance, Pain  Visit Diagnosis: Dizziness and giddiness  Other abnormalities of gait and mobility     Problem List Patient Active Problem List   Diagnosis Date Noted  . Stroke (cerebrum) ([place])   . Postpartum hemorrhage of vagina 11/14/2015  . Hyperlipidemia with target LDL less than 70 11/14/2015  . 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 Neck pain 11/13/2015  . 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 SVD (spontaneous vaginal delivery) 10/06/2015  . Postpartum care following vaginal delivery (4/13) 10/06/2015  . Postpartum state 10/06/2015  . Labor and delivery, indication for care 10/05/2015  . Seizure disorder ([place]) 01/09/2011   Billie Ruddy, PT, [facility] [address] [place] , [place], [address] Phone:  [phone]   Fax:  [phone] 12/15/2015, 12:44 PM   Name: LIZANN EDELMAN MRN: [id number] Date of Birth: 02/15/1981

## 2015-12-16 NOTE — Telephone Encounter (Signed)
Talked with pt over the phone. She said she still has some dizziness if standing too long or too fast, and also with head turning. She would like to go back to work on 12/28/15 but her work place asking a letter to state what her work restrictions. I ask her to contact her human resources and get a job description/restriction form so that we can fill out. She expressed understanding and will contact HR next week and drop off the form to our office next week.   Marilyn Black, please get the form and I will fill it out. Thanks.  Marvel PlanJindong Marilyn Raker, MD PhD Stroke Neurology 12/16/2015 6:22 PM

## 2015-12-22 ENCOUNTER — Ambulatory Visit: Payer: 59 | Admitting: Physical Therapy

## 2015-12-22 DIAGNOSIS — R42 Dizziness and giddiness: Secondary | ICD-10-CM

## 2015-12-22 NOTE — Therapy (Signed)
[facility] [address] [place], [place], [address] Phone: [phone]   Fax:  [phone]  Physical Therapy Treatment  Patient Details  Name: Marilyn Black MRN: [id number] Date of Birth: January 08, 1981 Referring Provider: Rosalin Hawking, MD, PhD (Dr. Erlinda Hong, per pt request. Hospitalist was Louellen Molder MD)  Encounter Date: 12/22/2015      PT End of Session - 12/22/15 1312    Visit Number 5   Number of Visits 9  requesting up to 4 additional visits, as needed   Date for PT Re-Evaluation 01/21/16   Authorization Type UHC   Authorization Time Period 60 visit limit combined   Authorization - Visit Number 4   Authorization - Number of Visits 60   PT Start Time 0802   PT Stop Time 0850   PT Time Calculation (min) 48 min   Equipment Utilized During Treatment Other (comment)  Balance Master and harness   Activity Tolerance Other (comment)  Unable to complete SOT due to significant dizziness and nausea   Behavior During Therapy [facility] for tasks assessed/performed      Past Medical History  Diagnosis Date  . 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 Thalassemia minor   . Postpartum care following vaginal delivery 01/09/2011  . Seizure disorder ([place]) 01/09/2011  . Gestational diabetes mellitus (GDM), antepartum   . Thalassemia trait   . Gestational diabetes     diet controlled  . Thalassemia     Past Surgical History  Procedure Laterality Date  . Wisdom tooth extraction  2002    There were no vitals filed for this visit.      Subjective Assessment - 12/22/15 0807    Subjective Pt requesting having seen her supervisor recently. Supervisor requesting letter from MD outlining current impairments and progress. Pt reports headaches are better but pt is nervous that she is having headaches in other places (L suboccipital, SCM).    Pertinent History PMH significant for: epilepsy, gestational DM, panic disorder   Patient Stated Goals "I  just want to make sure I'm okay."   Currently in Pain? No/denies            [facility] PT Assessment - 12/22/15 0001    Functional Gait  Assessment   Gait assessed  Yes   Gait Level Surface Walks 20 ft in less than 5.5 sec, no assistive devices, good speed, no evidence for imbalance, normal gait pattern, deviates no more than 6 in outside of the 12 in walkway width.   Change in Gait Speed Able to smoothly change walking speed without loss of balance or gait deviation. Deviate no more than 6 in outside of the 12 in walkway width.   Gait with Horizontal Head Turns Performs head turns smoothly with no change in gait. Deviates no more than 6 in outside 12 in walkway width   Gait with Vertical Head Turns Performs head turns with no change in gait. Deviates no more than 6 in outside 12 in walkway width.   Gait and Pivot Turn Pivot turns safely within 3 sec and stops quickly with no loss of balance.   Step Over Obstacle Is able to step over 2 stacked shoe boxes taped together (9 in total height) without changing gait speed. No evidence of imbalance.   Gait with Narrow Base of Support Is able to ambulate for 10 steps heel to toe with no staggering.   Gait with Eyes Closed Walks 20 ft, no assistive devices, good speed, no evidence  of imbalance, normal gait pattern, deviates no more than 6 in outside 12 in walkway width. Ambulates 20 ft in less than 7 sec.   Ambulating Backwards Walks 20 ft, no assistive devices, good speed, no evidence for imbalance, normal gait   Steps Alternating feet, no rail.   Total Score 30            Vestibular Assessment - 12/22/15 0001    Balancemaster   Therapist, occupational Comment Completed 5 of 6 conditions on SOT. Ended SOT after Condition 5 due to dizziness and severe nausea. All completed trials were WNL (as compared with age/height normative values) with exception of one trial on Condition 5.                 [facility] Adult  PT Treatment/Exercise - 12/22/15 0001    Ambulation/Gait   Ambulation/Gait Yes   Ambulation/Gait Assistance 6: Modified independent (Device/Increase time)   Ambulation/Gait Assistance Details Gait x1,000 as active recovery to mitigate nausea experienced by patient during SOT. During gait trial, PT educated pt on recommendation for pt to return to PT for at least 1 session after starting back to work to discuss limitations and to enable PT to educate pt on how to self-progress.   Ambulation Distance (Feet) 1000 Feet   Assistive device None   Gait Pattern Within Functional Limits   Ambulation Surface Unlevel;Outdoor;Paved                PT Education - 12/22/15 1307    Education provided Yes   Education Details Explained findings of FGA, SOT, and functional implications. Recommending pt return for at least 1 additional session to discuss return to work, how to self-progress post-DC.    Person(s) Educated Patient   Methods Explanation;Handout   Comprehension Verbalized understanding          PT Short Term Goals - 11/24/15 1139    PT SHORT TERM GOAL #1   Title STG's = LTG's           PT Long Term Goals - 12/22/15 1301    PT LONG TERM GOAL #1   Title Pt will independently perform HEP to maximize functional gains made in PT.     Baseline Met 6/8.   Status Achieved   PT LONG TERM GOAL #2   Title Pt will improve FGA score from 24/30 to 30/30 to indicate significant improvement in dynamic gait stability.    Baseline Met 6/29.   Status Achieved   PT LONG TERM GOAL #3   Title Pt will ambulate 85' with concurrent horizontal head turns with no overt LOB to indicate increased safety scanning grocery store aisle.    Status Achieved   PT LONG TERM GOAL #4   Title Pt will perform all above activities with no more than 2-point increase in headache pain to indicate improved tolerance to functional mobility.   Status Achieved   PT LONG TERM GOAL #5   Title SIS Mobility score will  increase from 25% to >/= 40% to indicate significant decrease in pt-perceived disability due to CVA.  (Modified target date: 01/19/16)   Baseline Continue goal through renwed POC.   Status On-going   Additional Long Term Goals   Additional Long Term Goals Yes   PT LONG TERM GOAL #6   Title Patient will tolerate Sensory Organization Test without need to stop test due to symptoms to indicate increased tolerance to vestibular reliance. (Target date: 01/19/16)  Status New   PT LONG TERM GOAL #7   Title Write appropriate goal for SOT based on findings to address pt use of multi-sensory input for balance.  (Target date: 01/19/16)   Status New               Plan - 12/22/15 1941    Clinical Impression Statement Pt improved FGA score from 24/30 to 30/30, suggesting decreased fall risk and increased dynamic gait stability. Pt inquiring as to whether she is safe to return to work at this time. Attempted to complete Sensory Organization Test to assess pt use of multi-sensory input for balance. Pt unable to complete SOT due to significant dizziness and nausea after Condition 5, which requires reliance on vestibular input. Recommending pt return to PT after return to work in 2 weeks to address physical challenges at work and to enable PT to educate pt on how to self-progress current HEP. Pt agreeable. Pt will benefit from skilled outpatient PT for up to 4 sessions over the next 4 weeks to address said impairments.   Rehab Potential Good   Clinical Impairments Affecting Rehab Potential age, patient motivation   PT Frequency Other (comment)  4 total sessions   PT Duration 4 weeks   PT Treatment/Interventions ADLs/Self Care Home Management;Vestibular;Gait training;Stair training;Neuromuscular re-education;Functional mobility training;Therapeutic activities;Patient/family education;Therapeutic exercise;Balance training;Visual/perceptual remediation/compensation   PT Next Visit Plan Consider trying SOT  again, if pt agreeable. Set goal based on findings. Add more visits? Teach patient to self-progress HEP by adding gait with head turns, single limb stance.   Consulted and Agree with Plan of Care Patient      Patient will benefit from skilled therapeutic intervention in order to improve the following deficits and impairments:  Dizziness, Pain (headache pain will be monitored but not directly addressed by PT)  Visit Diagnosis: Dizziness and giddiness - Plan: PT plan of care cert/re-cert     Problem List Patient Active Problem List   Diagnosis Date Noted  . Stroke (cerebrum) ([place])   . Postpartum hemorrhage of vagina 11/14/2015  . Hyperlipidemia with target LDL less than 70 11/14/2015  . 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 Neck pain 11/13/2015  . 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 SVD (spontaneous vaginal delivery) 10/06/2015  . Postpartum care following vaginal delivery (4/13) 10/06/2015  . Postpartum state 10/06/2015  . Labor and delivery, indication for care 10/05/2015  . Seizure disorder ([place]) 01/09/2011   Billie Ruddy, PT, [facility] [address] [place], [place], [address] Phone: [phone]   Fax:  [phone] 12/22/2015, 7:52 PM   Name: Marilyn Black MRN: [id number] Date of Birth: September 21, 1980

## 2016-01-04 ENCOUNTER — Ambulatory Visit: Payer: 59 | Admitting: Physical Therapy

## 2016-01-11 ENCOUNTER — Ambulatory Visit: Payer: 59 | Attending: Internal Medicine | Admitting: Physical Therapy

## 2016-01-11 DIAGNOSIS — R42 Dizziness and giddiness: Secondary | ICD-10-CM | POA: Insufficient documentation

## 2016-01-11 NOTE — Patient Instructions (Addendum)
*   With all exercises, keep symptoms (including headache) < 5/10. If symptoms exceed 4/10 (even if you're in the middle of the set) stop and rest until symptoms settle.  Gaze Stabilization: Tip Card  1.Target must remain in focus, not blurry, and appear stationary while head is in motion. 2.Perform exercises with small head movements (45 to either side of midline). 3.Increase speed of head motion so long as target is in focus. 4.Wear your glasses.  Gaze Stabilization: Standing Feet Apart    Feet shoulder width apart, keeping eyes on target on wall __3__ feet away, tilt head down slightly and move head side to side for _45___ seconds.  Repeat the same exercise while moving head up and down for __45__ seconds. Do __2__ sessions per day.   To progress this exercise:  1. Increase speed of head movement, but only as long as target is in focus.  2. Increase duration by 15 seconds until you're able to perform for 2 consecutive minutes.  3. When you can do this exercise for 2 minutes while keeping target in focus, place feet together and start back at 30 seconds.   Feet Together (Compliant Surface) Head Motion - Eyes Closed    1. Stand on 2 pillows (or 1 couch cushon) with feet together. Close eyes and move head slowly: up and down 10 times; right to left 10 times.  2. While still standing on 2 pillows, place feet shoulder apart and move head diagonally up-right to down-left 10 times; and diagonally from up-left to down-right 10 times.  Try to do this exercise 1-2 times per day.   Pen Push-ups (normal = maintaining object in focus when 2 inches or 6 cm from bridge of nose) 1. Hold a pen on front of you at arm's length. The pen should be vertical, with the tip at the top.The pen should be directly in front of your face, with the tip just below eye level.  2. Move the pencil slowly toward your face (all the way to your nose) as you concentrate and focus on the point. Soon you'll notice that  you see two pens rather than one OR the pen begins shaking. Stop.  3. Look away from the pen briefly to rest your eyes. Focus on something across the room for two or three seconds, and then look back at the pen point where you've stopped it close to your face. Look at the pen point carefully, and to try to focus so that the double vision Or shaking disappears and you only see one, still pen.  4. Move the pen back out to arm's length when you are able to rid yourself of double vision. If this takes more than a few seconds, look away and try again. Once you are able to accomplish it, move the pen back out to arm's length and complete the exercise again.  Try to perform this exercise 5-10 minutes per day.  Walking Head Turn    Walk the length of your hallway while turning head from right to left (right for 2-3 steps, then left for 2-3 steps).. Touch wall if necessary to keep balance. Then, walk length of hallway while turning head up to down in same manner. Perform for 2 consecutive minutes per day. Progress by 1 minute per week, as tolerated.  Copyright  VHI. All rights reserved.

## 2016-01-11 NOTE — Therapy (Addendum)
[facility] [address] [place], [place], [address] Phone: [phone]   Fax:  [phone]  Physical Therapy Treatment  and Discharge Summary   Patient Details  Name: Marilyn Black MRN: [id number] Date of Birth: February 13, 1981 Referring Provider: Rosalin Hawking, MD, PhD (Dr. Erlinda Hong, per pt request. Hospitalist was Louellen Molder MD)  Encounter Date: 01/11/2016      PT End of Session - 01/11/16 0953    Visit Number 6   Number of Visits 9   Date for PT Re-Evaluation 01/21/16   Authorization Type [employer]   Authorization Time Period 60 visit limit combined   Authorization - Visit Number 5   Authorization - Number of Visits 60   PT Start Time 0807   PT Stop Time 0850   PT Time Calculation (min) 43 min   Activity Tolerance Patient tolerated treatment well   Behavior During Therapy [facility] for tasks assessed/performed      Past Medical History  Diagnosis Date  . 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 Thalassemia minor   . Postpartum care following vaginal delivery 01/09/2011  . Seizure disorder ([place]) 01/09/2011  . Gestational diabetes mellitus (GDM), antepartum   . Thalassemia trait   . Gestational diabetes     diet controlled  . Thalassemia     Past Surgical History  Procedure Laterality Date  . Wisdom tooth extraction  2002    There were no vitals filed for this visit.      Subjective Assessment - 01/11/16 0810    Subjective "I'm back to work now...and at first I was having a lot of trouble because I turn around a lot. The first week back at work was rough, but now I'm much better. The hardest thing for me is watching my 5-year-oild son while he's constantly jumping and bouncing."   Pertinent History PMH significant for: epilepsy, gestational DM, panic disorder   Patient Stated Goals "I just want to make sure I'm okay."   Currently in Pain? No/denies                Vestibular Assessment - 01/11/16 0001    Occulomotor  Exam   Comment Convergence: impaired but improved, as pt reports diplopia with visual target approx. 3 to 3.5" from bridge of nose.   Vestibulo-Occular Reflex   VOR Cancellation Normal                 [facility] Adult PT Treatment/Exercise - 01/11/16 0001    Neuro Re-ed    Neuro Re-ed Details  Reviewed convergence HEP x3 minutes. Address patient questions in reference to "normal" (approx 2 inches of 6 cm from bridge of nose prior to double vision).          Vestibular Treatment/Exercise - 01/11/16 0001    Vestibular Treatment/Exercise   Vestibular Treatment Provided Gaze   Gaze Exercises X1 Viewing Horizontal;X1 Viewing Vertical   X1 Viewing Horizontal   Foot Position standing; feet shoulder width *decreased distance to from 4' to 3'' away fro. visual target   Reps 2   Comments 30 second trials x2 with cueing to increase speed of head movement as long as visual target in focus   X1 Viewing Vertical   Foot Position standing; feet shoulder width *decreased distance to from 4' to 3'' away fro. visual target   Reps 2   Comments 30 second trials x2 with cueing to increase speed of head movement as long as visual target in focus  Balance Exercises - 01/11/16 0943    Balance Exercises: Standing   Standing Eyes Closed Narrow base of support (BOS);Foam/compliant surface;Head turns;Other reps (comment)  2 pillows; horiz, vertical head turns x10 each   SLS Eyes open;Eyes closed;Solid surface;4 reps;10 secs  min guard due to postural sway with EC   Gait with Head Turns Forward           PT Education - 01/11/16 0815    Education provided Yes   Education Details Progressed HEP; See Pt Instructions for details. Discussed deferring SOT goals, as this is not functional for patient at this time. Recommended use of both eye and head movement (as VOR Cancellation does not appear impaired and is asymptomatic) to watch son while running and jumping.    Person(s) Educated  Patient   Methods Explanation;Demonstration   Comprehension Verbalized understanding;Returned demonstration          PT Short Term Goals - 11/24/15 1139    PT SHORT TERM GOAL #1   Title STG's = LTG's           PT Long Term Goals - 01/11/16 0941    PT LONG TERM GOAL #1   Title Pt will independently perform HEP to maximize functional gains made in PT.     Baseline Met 6/8.   Status Achieved   PT LONG TERM GOAL #2   Title Pt will improve FGA score from 24/30 to 30/30 to indicate significant improvement in dynamic gait stability.    Baseline Met 6/29.   Status Achieved   PT LONG TERM GOAL #3   Title Pt will ambulate 55' with concurrent horizontal head turns with no overt LOB to indicate increased safety scanning grocery store aisle.    Status Achieved   PT LONG TERM GOAL #4   Title Pt will perform all above activities with no more than 2-point increase in headache pain to indicate improved tolerance to functional mobility.   Status Achieved   PT LONG TERM GOAL #5   Title SIS Mobility score will increase from 25% to >/= 40% to indicate significant decrease in pt-perceived disability due to CVA.  (Modified target date: 01/19/16)   Baseline Continue goal through renwed POC.   Status On-going   Additional Long Term Goals   Additional Long Term Goals Yes   PT LONG TERM GOAL #6   Title Patient will tolerate Sensory Organization Test without need to stop test due to symptoms to indicate increased tolerance to vestibular reliance. (Target date: 01/19/16)   Baseline 7/19: Deferred due to goal not functional for patient at this time.   Status Deferred   PT LONG TERM GOAL #7   Title Write appropriate goal for SOT based on findings to address pt use of multi-sensory input for balance.  (Target date: 01/19/16)   Baseline 7/19: Deferred due to goal not functional for patient at this time.   Status Deferred   PT LONG TERM GOAL #8   Title Patient will report no more than 2-point increase in  symptoms during 8-hour work day to indicate decreased impact of visual/vestibular impairments on ability to work.  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 Pt tolerating 8-hour work day, but does report consistent increase  in symptoms of disequilibrium as well as occasional headache. Discussed SOT and the fact that cconditions of SOT not completely funtional for patient at this time; deferred LTG's for SOT. Progressed HEP due to significant improvement in patient tolerance to head/eye movement and vestibular reliance. Provided education (and handout) to enable pt to self-progress. In reference to POC, pt would like to see neurologist prior to DC from PT.    Rehab Potential Good   Clinical Impairments Affecting Rehab Potential age, patient motivation   PT Frequency --  4 total sessions   PT Duration 4 weeks   PT Treatment/Interventions ADLs/Self Care Home Management;Vestibular;Gait training;Stair training;Neuromuscular re-education;Functional mobility training;Therapeutic activities;Patient/family education;Therapeutic exercise;Balance training;Visual/perceptual remediation/compensation   PT Next Visit Plan If patient returns to PT, assess current HEP and progress prn. If neurologist gives okay, may teach pt neck stretching.   Consulted and Agree with Plan of Care Patient      Patient will benefit from skilled therapeutic intervention in order to improve the following deficits and impairments:  Dizziness, Pain (headache pain will be monitored but not directly addressed by PT due to nature of referral)  Visit Diagnosis: Dizziness and giddiness     Problem List Patient Active Problem List   Diagnosis Date Noted  . Stroke (cerebrum) ([place])   . Postpartum hemorrhage of vagina 11/14/2015   . Hyperlipidemia with target LDL less than 70 11/14/2015  . 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 Neck pain 11/13/2015  . 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 SVD (spontaneous vaginal delivery) 10/06/2015  . Postpartum care following vaginal delivery (4/13) 10/06/2015  . Postpartum state 10/06/2015  . Labor and delivery, indication for care 10/05/2015  . Seizure disorder ([place]) 01/09/2011    Billie Ruddy, PT, [facility] [address] [place], [place], [address] Phone: [phone]   Fax:  [phone] 01/11/2016, 10:01 AM  Name: BRIAH NARY MRN: [id number] Date of Birth: Apr 29, 1981   PHYSICAL THERAPY DISCHARGE SUMMARY  Visits from Start of Care: 6  Current functional level related to goals / functional outcomes: See above.   Remaining deficits: Unknown, as patient did not return to PT after initial 3 sessions.    Education / Equipment: See above.  Plan: Patient agrees to discharge.  Patient goals were partially met. Patient is being discharged due to being pleased with the current functional level.  ?????        Billie Ruddy, PT, DPT [facility] [address] [place], [place], [address] Phone: [phone]   Fax:  [phone] 05/16/16, 9:41 AM

## 2016-01-17 ENCOUNTER — Ambulatory Visit (INDEPENDENT_AMBULATORY_CARE_PROVIDER_SITE_OTHER): Payer: 59 | Admitting: Neurology

## 2016-01-17 ENCOUNTER — Telehealth: Payer: Self-pay | Admitting: Neurology

## 2016-01-17 ENCOUNTER — Encounter: Payer: Self-pay | Admitting: Neurology

## 2016-01-17 VITALS — BP 120/72 | Ht 65.0 in | Wt 233.2 lb

## 2016-01-17 DIAGNOSIS — I7774 Dissection of vertebral artery: Secondary | ICD-10-CM | POA: Diagnosis not present

## 2016-01-17 DIAGNOSIS — I63212 Cerebral infarction due to unspecified occlusion or stenosis of left vertebral arteries: Secondary | ICD-10-CM | POA: Diagnosis not present

## 2016-01-17 DIAGNOSIS — R569 Unspecified convulsions: Secondary | ICD-10-CM | POA: Diagnosis not present

## 2016-01-17 NOTE — Patient Instructions (Signed)
-   continue ASA for stroke prevention - continue home exercise - we will repeat CT angio next visit  - Follow up with your primary care physician for stroke risk factor modification. Recommend maintain blood pressure goal <130/80, diabetes with hemoglobin A1c goal below 7.0% and lipids with LDL cholesterol goal below 70 mg/dL.  - follow up with your epileptologist in [facility] - healthy diet and regular exercise - follow up in 4 months.

## 2016-01-17 NOTE — Progress Notes (Signed)
STROKE NEUROLOGY FOLLOW UP NOTE  NAME: Marilyn Black DOB: [birth year]  REASON FOR VISIT: stroke follow up HISTORY FROM: pt and chart  Today we had the pleasure of seeing Marilyn Black in follow-up at our Neurology Clinic. Pt was accompanied by no one.   History Summary [name] is a 35 y.o. female 5 weeks postpartum with hx seizure d/o was admitted on 11/13/15 for sudden onset vertigo in setting of postpartum hemorrhage. 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 CTA head and neck showed tandem discrete narrowing L VA concerning for dissection. Etiology likely associated with postpartum state. TTE EF 65-70%. LDL 135 and A1C 5.8. Hypercoagulable work up negative. She was put on ASA 325. No statin due to lactation. She has seizure d/o following with [facility] and on trileptal. She was discharged in good condition with ASA.   Interval History During the interval time, the patient has been doing well. Near finishing outpt PT/OT. Back to work now as Comptroller. Still lactating and BP today 121/80. No other complains.   REVIEW OF SYSTEMS: Full 14 system review of systems performed and notable only for those listed below and in HPI above, all others are negative:  Constitutional:   Cardiovascular:  Ear/Nose/Throat:   Skin:  Eyes:  Light sensitivity, double vision, blurred vision Respiratory:   Gastroitestinal:   Genitourinary:  Hematology/Lymphatic:   Endocrine:  Musculoskeletal:  Neck pain, neck stiffness Allergy/Immunology:   Neurological:  Memory loss, dizziness, HA Psychiatric:  Sleep:   The following represents the patient's updated allergies and side effects list: Allergies  Allergen Reactions  . Codeine Other (See Comments)    fever  . Penicillins Rash    Has patient had a PCN reaction causing immediate rash, facial/tongue/throat swelling, SOB or lightheadedness with hypotension: Yes Has patient  had a PCN reaction causing severe rash involving mucus membranes or skin necrosis: No Has patient had a PCN reaction that required hospitalization No Has patient had a PCN reaction occurring within the last 10 years: No If all of the above answers are "NO", then may proceed with Cephalosporin use.  . Sulfa Antibiotics Rash  . Erythromycin Other (See Comments)    Infant allergic reaction    The neurologically relevant items on the patient's problem list were reviewed on today's visit.  Neurologic Examination  A problem focused neurological exam (12 or more points of the single system neurologic examination, vital signs counts as 1 point, cranial nerves count for 8 points) was performed.  Blood pressure 120/72, height 5\' 5"  (1.651 m), weight 233 lb 3.2 oz (105.8 kg), currently breastfeeding.  General - Well nourished, well developed, in no apparent distress.  Ophthalmologic - Fundi not visualized due to light sensitivity.  Cardiovascular - Regular rate and rhythm with no murmur.  Mental Status -  Level of arousal and orientation to time, place, and person were intact. Language including expression, naming, repetition, comprehension was assessed and found intact. Fund of Knowledge was assessed and was intact.  Cranial Nerves II - XII - II - Visual field intact OU. III, IV, VI - Extraocular movements intact. V - Facial sensation intact bilaterally. VII - Facial movement intact bilaterally. VIII - Hearing & vestibular intact bilaterally. X - Palate elevates symmetrically. XI - Chin turning & shoulder shrug intact bilaterally. XII - Tongue protrusion intact.  Motor Strength - The patient's strength was normal in all extremities and pronator  drift was absent.  Bulk was normal and fasciculations were absent.   Motor Tone - Muscle tone was assessed at the neck and appendages and was normal.  Reflexes - The patient's reflexes were 1+ in all extremities and she had no pathological  reflexes.  Sensory - Light touch, temperature/pinprick, vibration and proprioception, and Romberg testing were assessed and were normal.    Coordination - The patient had normal movements in the hands and feet with no ataxia or dysmetria.  Tremor was absent.  Gait and Station - The patient's transfers, posture, gait, station, and turns were observed as normal.   Functional score  mRS = 1   0 - No symptoms.   1 - No significant disability. Able to carry out all usual activities, despite some symptoms.   2 - Slight disability. Able to look after own affairs without assistance, but unable to carry out all previous activities.   3 - Moderate disability. Requires some help, but able to walk unassisted.   4 - Moderately severe disability. Unable to attend to own bodily needs without assistance, and unable to walk unassisted.   5 - Severe disability. Requires constant nursing care and attention, bedridden, incontinent.   6 - Dead.   NIH Stroke Scale = 0   Data reviewed: I personally reviewed the images and agree with the radiology interpretations.  Ct Angio Head & neck W/cm &/or Wo Cm 11/13/2015  1. Areas of tandem discrete narrowing of the left vertebral artery concerning for the left vertebral artery dissection. Discrete narrowing is present at the C5-6 level and fifth C1 level on the left. 2. 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 3. The anterior circulation and right vertebral artery are normal. 4. Circle of Willis is unremarkable. No significant proximal stenosis, aneurysm, or branch vessel occlusion. Electronically Signed: By: Marin Roberts M.D. On: 11/13/2015 13:18   Mr Brain Wo Contrast 11/14/2015  1. Patchy acute/early subacute ischemic nonhemorrhagic infarcts involving the inferior left cerebellum. No associated hemorrhage or mass effect. 2. Punctate 4 mm acute/early subacute ischemic nonhemorrhagic infarct involving the  superior right cerebellum. 3. Otherwise normal brain MRI.    Mr Cervical Spine Wo Contrast 11/14/2015   1. Acute left vertebral artery dissection, similar relative to prior CTA 11/13/2015. 2. Changes related to acute/early subacute infarct within inferior left cerebellar hemisphere, better evaluated on concomitant MRI of the brain. 3. Otherwise normal MRI of the cervical spine.   TTE - Left ventricle: The cavity size was normal. Wall thickness was  normal. Systolic function was vigorous. The estimated ejection  fraction was in the range of 65% to 70%. Left ventricular  diastolic function parameters were normal.  Component     Latest Ref Rng & Units 11/14/2015  Cholesterol     0 - 200 mg/dL 696 (H)  Triglycerides     <150 mg/dL 78  HDL Cholesterol     >40 mg/dL 78  Total CHOL/HDL Ratio     RATIO 2.9  VLDL     0 - 40 mg/dL 16  LDL (calc)     0 - 99 mg/dL 295 (H)  Opiates     NONE DETECTED NONE DETECTED  COCAINE     NONE DETECTED NONE DETECTED  Benzodiazepines     NONE DETECTED NONE DETECTED  Amphetamines     NONE DETECTED NONE DETECTED  Tetrahydrocannabinol     NONE DETECTED NONE DETECTED  Barbiturates     NONE DETECTED NONE DETECTED  PTT Lupus Anticoagulant     0.0 - 43.6 sec 35.6  DRVVT     0.0 - 47.0 sec 35.6  Lupus Anticoag Interp      Comment:  Beta-2 Glycoprotein I Ab, IgG     0 - 20 GPI IgG units <9  Beta-2-Glycoprotein I IgM     0 - 32 GPI IgM units <9  Beta-2-Glycoprotein I IgA     0 - 25 GPI IgA units <9  Anticardiolipin Ab,IgG,Qn     0 - 14 GPL U/mL <9  Anticardiolipin Ab,IgM,Qn     0 - 12 MPL U/mL 10  Anticardiolipin Ab,IgA,Qn     0 - 11 APL U/mL <9  Hemoglobin A1C     4.8 - 5.6 % 5.8 (H)  Mean Plasma Glucose     mg/dL 808  [id number]:      Comment  Comment      Comment  Recommendations-PTGENE:      Comment  Additional Information      Comment  Antithrombin Activity     75 - 120 % 120  Protein C-Functional     73 - 180 %  143  Protein C, Total     60 - 150 % 104  Protein S-Functional     63 - 140 % 66  Protein S, Total     60 - 150 % 107  Homocysteine     0.0 - 15.0 umol/L 5.3    Assessment: As you may recall, she is a 35 y.o. Caucasian female 5 weeks postpartum PTA with hx seizure d/o was admitted on 11/13/15 for sudden onset vertigo. 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 CTA head and neck showed tandem discrete narrowing L VA concerning for dissection. Etiology of dissection likely associated with postpartum state. TTE EF 65-70%. LDL 135 and A1C 5.8. Hypercoagulable work up negative. She was put on ASA 325. No statin due to lactation. During the interval time, the patient has been doing well. Back to work now as Comptroller. Still lactating so continue to hold off statin.   Plan:  - continue ASA for stroke prevention - continue home exercise - will repeat CT angio next visit  - Follow up with your primary care physician for stroke risk factor modification. Recommend maintain blood pressure goal <130/80, diabetes with hemoglobin A1c goal below 7.0% and lipids with LDL cholesterol goal below 70 mg/dL.  - follow up with your epileptologist for seizure d/o in [facility] - healthy diet and regular exercise - follow up in 4 months.  I spent more than 25 minutes of face to face time with the patient. Greater than 50% of time was spent in counseling and coordination of care. We discussed duration of ASA therapy, repeat CTA next visit, and home exercise.   No orders of the defined types were placed in this encounter.   Meds ordered this encounter  Medications  . folic acid (FOLVITE) 1 MG tablet    Sig: 4 mg.     Patient Instructions  - continue ASA for stroke prevention - continue home exercise - we will repeat CT angio next visit  - Follow up with your primary care physician for stroke risk factor modification. Recommend maintain  blood pressure goal <130/80, diabetes with hemoglobin A1c goal below 7.0% and lipids with LDL cholesterol goal below 70 mg/dL.  - follow up with your epileptologist in [facility] - healthy diet and  regular exercise - follow up in 4 months.   Marvel Plan, MD PhD [facility] [address], [address] [place], [place] [address] [phone]

## 2016-01-20 NOTE — Telephone Encounter (Signed)
I think either way is OK. CTA can be done before or after the visit. If before, I guess she needs a blood draw in advance to make sure her kidney function is OK before the study. If I see her first, I can order the BMP during the visit and do the CTA. Either way is fine with me.   Marvel Plan, MD PhD Stroke Neurology 01/20/2016 6:31 PM

## 2016-01-20 NOTE — Telephone Encounter (Signed)
Dr. Roda Shutters do you want test schedule prior to November appt.

## 2016-04-09 NOTE — Telephone Encounter (Signed)
Pt called said it has been 3 mths and she has not heard anything about CT scan being scheduled. I told her Dr Roda ShuttersXu will need to place an order. She would prefer to have it prior to her appt on 11/27.

## 2016-04-09 NOTE — Telephone Encounter (Signed)
Rn call patient about the CT scan being schedule. Rn stated Dr. Roda ShuttersXu was sent a message about the test. Rn stated per Dr.Xu last t/c note it can be schedule prior or after the visit on 05/21/2016. Rn stated she will need kidney function. Rn stated a message will be sent to Dr Roda ShuttersXu.

## 2016-04-11 ENCOUNTER — Other Ambulatory Visit: Payer: Self-pay | Admitting: Neurology

## 2016-04-11 NOTE — Telephone Encounter (Signed)
Called pt to discuss about the CTA neck repeat. She was not available and I left VM for her to call back.   Hi, Marilyn Black, when she calls back, we need to know whether she is pregnant at this time, if not, when was the last menstrual period. If she is pregnant, we can not do CTA. Thanks.   Marvel PlanJindong [name], MD PhD Stroke Neurology 04/11/2016 4:16 PM

## 2016-04-11 NOTE — Telephone Encounter (Signed)
LFt vm for patient about CTA neck with contrast. Need to make sure she is not pregnant. Also when was her last period.

## 2016-04-12 ENCOUNTER — Telehealth: Payer: Self-pay | Admitting: Neurology

## 2016-04-12 NOTE — Telephone Encounter (Signed)
Patient returned Katrina and Dr. Warren DanesXu's call, skyped nurse Katrina, per Katrina, asked patient if she is pregnant at this time, patient states no, asked patient when was her last menstrual period, patient states she hasn't had one yet, she is nursing, Katrina advised.

## 2016-04-12 NOTE — Telephone Encounter (Signed)
Pt called in asking to reduce the dosage of aspirin. She says that her stomach hurts all the time and she feels she may have an ulcer. She is going to check with her other physician to be seen for it but wanted to know first if the dosage could be reduced. Please call [phone]

## 2016-04-12 NOTE — Addendum Note (Signed)
Addended by: Marvel PlanXU, Gustave Lindeman on: 04/12/2016 06:07 PM   Modules accepted: Orders

## 2016-04-12 NOTE — Telephone Encounter (Signed)
Please see the other telephone note. Thanks.   Marvel PlanJindong Kynsley Whitehouse, MD PhD Stroke Neurology 04/12/2016 6:08 PM

## 2016-04-12 NOTE — Telephone Encounter (Signed)
Called the patient back with two issues so far:  1. Stomachache: Patient said that recently she has heartburn, stomachache, constipation, and she felt this is something may be related for her ASA use, she is going to see her PCP tomorrow regarding all the symptoms. I told her that we are giving her aspirin which is EC tablet, should not be too much issue with her symptoms. However, if her PCP feels that she needs to be on lower dose or need to be changed to other antiplatelet, I'm okay with that. She will let us know.  2. CTA: I told her that we try to avoid pregnancy while having CTA test. She understood. We will order a CTA and she will make the schedule with imaging department, and let us know the appointment date. We will order a urine pregnancy test just the day before the scheduled CTA date, and if urine pregnancy test is negative, she will go ahead with CTA as scheduled. She expressed understanding and appreciation.  Marvel PlanJindong Ma Munoz, MD PhD Stroke Neurology 04/12/2016 6:03 PM  Orders Placed This Encounter  Procedures  . CT ANGIO NECK W OR WO CONTRAST    Standing Status:   Future    Standing Expiration Date:   06/12/2017    Order Specific Question:   If indicated for the ordered procedure, I authorize the administration of contrast media per Radiology protocol    Answer:   Yes    Order Specific Question:   Reason for Exam (SYMPTOM  OR DIAGNOSIS REQUIRED)    Answer:   follow up with VA dissection    Order Specific Question:   Is patient pregnant?    Answer:   No    Order Specific Question:   Preferred imaging location?    Answer:   Internal

## 2016-05-08 ENCOUNTER — Other Ambulatory Visit: Payer: Self-pay | Admitting: Neurology

## 2016-05-08 ENCOUNTER — Telehealth: Payer: Self-pay | Admitting: Neurology

## 2016-05-08 ENCOUNTER — Other Ambulatory Visit (INDEPENDENT_AMBULATORY_CARE_PROVIDER_SITE_OTHER): Payer: Self-pay

## 2016-05-08 DIAGNOSIS — I63212 Cerebral infarction due to unspecified occlusion or stenosis of left vertebral arteries: Secondary | ICD-10-CM

## 2016-05-08 DIAGNOSIS — Z0289 Encounter for other administrative examinations: Secondary | ICD-10-CM

## 2016-05-08 NOTE — Telephone Encounter (Signed)
RN call patient about lab work. Rn stated she needs a urine test for pregnancy,and BUN, and Creatine done. Pt states her last period was 04/12/2016. PT stated her period has not come on for 04/2016. Pt states she is not pregnant. Rn stated Dr. Pearlean BrownieSethi was notified of her last menstrual cycle. He wants her to still have the urine, and HCG test. Pt states she can come in tomorrow at 0800am. Rn stated there is no guarantee the lab work will be process tomorrow by her appt time a 0300pm. Pt stated she can come in today at 0215pm. Rn explain she just needs to tell first desk and they will direct her to lab. Pt verbalized understanding,

## 2016-05-08 NOTE — Telephone Encounter (Signed)
Patient needs pregnancy test and labs prior to Ct angio neck head on 05/09/2016.

## 2016-05-08 NOTE — Telephone Encounter (Signed)
Patient called to ask what other labs she could go to, to do pregnancy test, prior to CT scan, will try to come to our office around 8am.

## 2016-05-09 ENCOUNTER — Ambulatory Visit
Admission: RE | Admit: 2016-05-09 | Discharge: 2016-05-09 | Disposition: A | Discharge status: Home / Self Care | Payer: 59 | Source: Ambulatory Visit | Attending: Neurology | Admitting: Neurology

## 2016-05-09 DIAGNOSIS — I7774 Dissection of vertebral artery: Secondary | ICD-10-CM

## 2016-05-09 LAB — BASIC METABOLIC PANEL
BUN / CREAT RATIO: 31 — AB (ref 9–23)
BUN: 22 mg/dL — ABNORMAL HIGH (ref 6–20)
CO2: 24 mmol/L (ref 18–29)
CREATININE: 0.72 mg/dL (ref 0.57–1.00)
Calcium: 9.9 mg/dL (ref 8.7–10.2)
Chloride: 100 mmol/L (ref 96–106)
GFR calc Af Amer: 126 mL/min/{1.73_m2} (ref >59)
GFR calc non Af Amer: 110 mL/min/{1.73_m2} (ref >59)
GLUCOSE: 102 mg/dL — AB (ref 65–99)
Potassium: 4.6 mmol/L (ref 3.5–5.2)
SODIUM: 139 mmol/L (ref 134–144)

## 2016-05-09 LAB — PREGNANCY, URINE: Preg Test, Ur: NEGATIVE

## 2016-05-09 LAB — HCG, SERUM, QUALITATIVE: HCG, BETA SUBUNIT, QUAL, SERUM: NEGATIVE m[IU]/mL (ref <6)

## 2016-05-09 MED ORDER — IOPAMIDOL (ISOVUE-300) INJECTION 61%
75.0000 mL | Freq: Once | INTRAVENOUS | Status: AC | PRN
  Administered 2016-05-09: 75 mL via INTRAVENOUS

## 2016-05-15 ENCOUNTER — Telehealth: Payer: Self-pay

## 2016-05-15 NOTE — Telephone Encounter (Signed)
Rn call patient about the Ct angiogram test. Rn stated per Dr. Roda ShuttersXu note the left vessel at the back previously injured in May is back to normal. Good News. Pt verbalized understanding. Rn stated at her appt on Monday he will show her the images.

## 2016-05-15 NOTE — Telephone Encounter (Signed)
-----   Message from Marvel PlanJindong Xu, MD sent at 05/15/2016  5:06 PM EST ----- Could you please let the patient know that the CT angiogram test done recently showed left vessel at the back previously injured in may is now back to normal, which is a really good news. I will show her the images next week when she is in the office. Please continue current treatment. Thanks.  Marvel PlanJindong Xu, MD PhD Stroke Neurology 05/15/2016 5:06 PM

## 2016-05-21 ENCOUNTER — Encounter: Payer: Self-pay | Admitting: Neurology

## 2016-05-21 ENCOUNTER — Ambulatory Visit (INDEPENDENT_AMBULATORY_CARE_PROVIDER_SITE_OTHER): Payer: 59 | Admitting: Neurology

## 2016-05-21 VITALS — BP 117/81 | Wt 236.8 lb

## 2016-05-21 DIAGNOSIS — G40909 Epilepsy, unspecified, not intractable, without status epilepticus: Secondary | ICD-10-CM

## 2016-05-21 DIAGNOSIS — E785 Hyperlipidemia, unspecified: Secondary | ICD-10-CM

## 2016-05-21 DIAGNOSIS — I7774 Dissection of vertebral artery: Secondary | ICD-10-CM | POA: Diagnosis not present

## 2016-05-21 DIAGNOSIS — I63212 Cerebral infarction due to unspecified occlusion or stenosis of left vertebral arteries: Secondary | ICD-10-CM | POA: Diagnosis not present

## 2016-05-21 NOTE — Progress Notes (Signed)
STROKE NEUROLOGY FOLLOW UP NOTE  NAME: Marilyn Black DOB: Oct 27, 1980  REASON FOR VISIT: stroke follow up HISTORY FROM: pt and chart  Today we had Marilyn pleasure of seeing Marilyn Black in follow-up at our Neurology Clinic. Pt was accompanied by no one.   History Summary Marilyn Black is a 35 y.o. female 5 weeks postpartum with hx seizure d/o was admitted on 11/13/15 for sudden onset vertigo in setting of postpartum hemorrhage. 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 CTA head and neck showed tandem discrete narrowing L VA concerning for dissection. Etiology likely associated with postpartum state. TTE EF 65-70%. LDL 135 and A1C 5.8. Hypercoagulable work up negative. She was put on ASA 325. No statin due to lactation. She has seizure d/o [facility] and on trileptal. She was discharged in good condition with ASA.   01/17/16 follow up - Marilyn Black has been doing well. Near finishing outpt PT/OT. Back to work now as Comptrollerlibrarian. Still lactating and BP today 121/80. No other complains.   Interval History During Marilyn interval time, Marilyn pt has been doing well. No complains. Had repeat CTA showed left VA now patent and healed well. Repeat lipid panel still showing elevated LDL. She is about to finish with lactation and will be put on statin by her PCP. [facility]. She stated that since on ASA, she had two times with alcohol consumption and she felt discomfort in stomach. I recommend her to switch from ASA EC 325 to 81mg .   REVIEW OF SYSTEMS: Full 14 system review of systems performed and notable only for those listed below and in HPI above, all others are negative:  Constitutional:   Cardiovascular:  Ear/Nose/Throat:   Skin:  Eyes:   Respiratory:   Gastroitestinal:   Genitourinary:  Hematology/Lymphatic:   Endocrine:  Musculoskeletal:   Allergy/Immunology:     Neurological: dizziness Psychiatric:  Sleep:   Marilyn following represents Marilyn Black's updated allergies and side effects list: Allergies  Allergen Reactions  . Codeine Other (See Comments)    fever  . Penicillins Rash    Has Black had a PCN reaction causing immediate rash, facial/tongue/throat swelling, SOB or lightheadedness with hypotension: Yes Has Black had a PCN reaction causing severe rash involving mucus membranes or skin necrosis: No Has Black had a PCN reaction that required hospitalization No Has Black had a PCN reaction occurring within Marilyn last 10 years: No If all of Marilyn above answers are "NO", then may proceed with Cephalosporin use.  . Sulfa Antibiotics Rash  . Erythromycin Other (See Comments)    Infant allergic reaction    Marilyn neurologically relevant items on Marilyn Black's problem list were reviewed on today's visit.  Neurologic Examination  A problem focused neurological exam (12 or more points of Marilyn single system neurologic examination, vital signs counts as 1 point, cranial nerves count for 8 points) was performed.  Blood pressure 117/81, weight 236 lb 12.8 oz (107.4 kg), last menstrual period 05/18/2016, currently breastfeeding.  General - Well nourished, well developed, in no apparent distress.  Ophthalmologic - Fundi not visualized due to light sensitivity.  Cardiovascular - Regular rate and rhythm with no murmur.  Mental Status -  Level of arousal and orientation to time, place, and person were intact. Language including expression, naming, repetition, comprehension was assessed and found intact. Fund of Knowledge was assessed and was  intact.  Cranial Nerves II - XII - II - Visual field intact OU. III, IV, VI - Extraocular movements intact. V - Facial sensation intact bilaterally. VII - Facial movement intact bilaterally. VIII - Hearing & vestibular intact bilaterally. X - Palate elevates symmetrically. XI - Chin turning & shoulder shrug  intact bilaterally. XII - Tongue protrusion intact.  Motor Strength - Marilyn Black's strength was normal in all extremities and pronator drift was absent.  Bulk was normal and fasciculations were absent.   Motor Tone - Muscle tone was assessed at Marilyn neck and appendages and was normal.  Reflexes - Marilyn Black's reflexes were 1+ in all extremities and she had no pathological reflexes.  Sensory - Light touch, temperature/pinprick, vibration and proprioception, and Romberg testing were assessed and were normal.    Coordination - Marilyn Black had normal movements in Marilyn hands and feet with no ataxia or dysmetria.  Tremor was absent.  Gait and Station - Marilyn Black's transfers, posture, gait, station, and turns were observed as normal.   Data reviewed: I personally reviewed Marilyn images and agree with Marilyn radiology interpretations.  Ct Angio Head & neck W/cm &/or Wo Cm 11/13/2015  1. Areas of tandem discrete narrowing of Marilyn left vertebral artery concerning for Marilyn left vertebral artery dissection. Discrete narrowing is present at Marilyn C5-6 level and fifth C1 level on Marilyn left. 2. Nonhemorrhagic infarcts at Marilyn inferior aspect of Marilyn left cerebellar hemisphere. 3. Marilyn anterior circulation and right vertebral artery are normal. 4. Circle of Willis is unremarkable. No significant proximal stenosis, aneurysm, or branch vessel occlusion. Electronically Signed: By: Marin Roberts M.D. On: 11/13/2015 13:18   Mr Brain Wo Contrast 11/14/2015  1. Patchy acute/early subacute ischemic nonhemorrhagic infarcts involving Marilyn inferior left cerebellum. No associated hemorrhage or mass effect. 2. Punctate 4 mm acute/early subacute ischemic nonhemorrhagic infarct involving Marilyn superior right cerebellum. 3. Otherwise normal brain MRI.    Mr Cervical Spine Wo Contrast 11/14/2015   1. Acute left vertebral artery dissection, similar relative to prior CTA 11/13/2015. 2. Changes related to acute/early subacute infarct  within inferior left cerebellar hemisphere, better evaluated on concomitant MRI of Marilyn brain. 3. Otherwise normal MRI of Marilyn cervical spine.   TTE - Left ventricle: Marilyn cavity size was normal. Wall thickness was  normal. Systolic function was vigorous. Marilyn estimated ejection  fraction was in Marilyn range of 65% to 70%. Left ventricular  diastolic function parameters were normal.  Ct Angio Neck W Or Wo Contrast 05/09/2016 FINDINGS: AORTIC ARCH: Normal appearance of Marilyn thoracic arch, normal branch pattern. Marilyn origins of Marilyn innominate, left Common carotid artery and subclavian artery are widely patent. RIGHT CAROTID SYSTEM: Common carotid artery is widely patent, coursing in a straight line fashion. Normal appearance of Marilyn carotid bifurcation without hemodynamically significant stenosis by NASCET criteria. Normal appearance of Marilyn included internal carotid artery. LEFT CAROTID SYSTEM: Common carotid artery is widely patent, coursing in a straight line fashion. Normal appearance of Marilyn carotid bifurcation without hemodynamically significant stenosis by NASCET criteria. Normal appearance of Marilyn included internal carotid artery. VERTEBRAL ARTERIES:Left vertebral artery is dominant. Normal appearance of Marilyn vertebral arteries, which appear widely patent. SKELETON: No acute osseous process though bone windows have not been submitted. OTHER NECK: Soft tissues of Marilyn neck are nonacute though, not tailored for evaluation.   Component     Latest Ref Rng & Units 11/14/2015  Cholesterol     0 - 200 mg/dL 161 (H)  Triglycerides     <  150 mg/dL 78  HDL Cholesterol     >40 mg/dL 78  Total CHOL/HDL Ratio     RATIO 2.9  VLDL     0 - 40 mg/dL 16  LDL (calc)     0 - 99 mg/dL 161 (H)  Opiates     NONE DETECTED NONE DETECTED  COCAINE     NONE DETECTED NONE DETECTED  Benzodiazepines     NONE DETECTED NONE DETECTED  Amphetamines     NONE DETECTED NONE DETECTED  Tetrahydrocannabinol     NONE DETECTED NONE  DETECTED  Barbiturates     NONE DETECTED NONE DETECTED  PTT Lupus Anticoagulant     0.0 - 43.6 sec 35.6  DRVVT     0.0 - 47.0 sec 35.6  Lupus Anticoag Interp      Comment:  Beta-2 Glycoprotein I Ab, IgG     0 - 20 GPI IgG units <9  Beta-2-Glycoprotein I IgM     0 - 32 GPI IgM units <9  Beta-2-Glycoprotein I IgA     0 - 25 GPI IgA units <9  Anticardiolipin Ab,IgG,Qn     0 - 14 GPL U/mL <9  Anticardiolipin Ab,IgM,Qn     0 - 12 MPL U/mL 10  Anticardiolipin Ab,IgA,Qn     0 - 11 APL U/mL <9  Hemoglobin A1C     4.8 - 5.6 % 5.8 (H)  Mean Plasma Glucose     mg/dL 096  [id number]:      Comment  Comment      Comment  Recommendations-PTGENE:      Comment  Additional Information      Comment  Antithrombin Activity     75 - 120 % 120  Protein C-Functional     73 - 180 % 143  Protein C, Total     60 - 150 % 104  Protein S-Functional     63 - 140 % 66  Protein S, Total     60 - 150 % 107  Homocysteine     0.0 - 15.0 umol/L 5.3    Assessment: As you may recall, she is a 35 y.o. Caucasian female 5 weeks postpartum PTA with hx seizure d/o was admitted on 11/13/15 for sudden onset vertigo. 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 CTA head and neck showed tandem discrete narrowing L VA concerning for dissection. Etiology of dissection likely associated with postpartum state. TTE EF 65-70%. LDL 135 and A1C 5.8. Hypercoagulable work up negative. She was put on ASA EC 325. No statin due to lactation. During Marilyn interval time, Marilyn Black has been doing well. Back to work now as Comptroller. Still lactating so continue to hold off statin. Repeat lipid panel by PCP and was told still has elevated LDL, plan to start statin once off lactation. Repeat CTA neck showed left VA widely patent and healed well.   Plan:  - continue ASA EC 81mg  for stroke prevention - recommend low dose lipitor for cholesterol and for  stroke prevention after lactation.  - Follow up with your primary care physician for stroke risk factor modification. Recommend maintain blood pressure goal <130/80, diabetes with hemoglobin A1c goal below 7.0% and lipids with LDL cholesterol goal below 70 mg/dL.  - follow up with your epileptologist for seizure d/o in [facility] - healthy diet and regular exercise - follow up as needed.   No orders of Marilyn defined types  were placed in this encounter.   No orders of Marilyn defined types were placed in this encounter.   Black Instructions  - continue ASA EC 81mg  for stroke prevention - recommend low dose lipitor for cholesterol and for stroke prevention after lactation.  - Follow up with your primary care physician for stroke risk factor modification. Recommend maintain blood pressure goal <130/80, diabetes with hemoglobin A1c goal below 7.0% and lipids with LDL cholesterol goal below 70 mg/dL.  - follow up with [facility]pt[facility] - healthy diet and regular exercise - follow up as needed.   Marvel PlanJindong Jahari Wiginton, MD PhD [facility] [address], [address] [place], [place] [address] [phone]

## 2016-05-21 NOTE — Patient Instructions (Addendum)
-   continue ASA EC 81mg  for stroke prevention - recommend low dose lipitor for cholesterol and for stroke prevention after lactation.  - Follow up with your primary care physician for stroke risk factor modification. Recommend maintain blood pressure goal <130/80, diabetes with hemoglobin A1c goal below 7.0% and lipids with LDL cholesterol goal below 70 mg/dL.  - follow up with [facility] - healthy diet and regular exercise - follow up as needed.

## 2016-07-19 DIAGNOSIS — J020 Streptococcal pharyngitis: Secondary | ICD-10-CM | POA: Diagnosis not present

## 2016-07-29 DIAGNOSIS — J069 Acute upper respiratory infection, unspecified: Secondary | ICD-10-CM | POA: Diagnosis not present

## 2016-07-29 DIAGNOSIS — R509 Fever, unspecified: Secondary | ICD-10-CM | POA: Diagnosis not present

## 2016-08-10 DIAGNOSIS — H6992 Unspecified Eustachian tube disorder, left ear: Secondary | ICD-10-CM | POA: Diagnosis not present

## 2016-08-22 DIAGNOSIS — I639 Cerebral infarction, unspecified: Secondary | ICD-10-CM | POA: Diagnosis not present

## 2016-08-22 DIAGNOSIS — I7774 Dissection of vertebral artery: Secondary | ICD-10-CM | POA: Diagnosis not present

## 2016-08-22 DIAGNOSIS — E789 Disorder of lipoprotein metabolism, unspecified: Secondary | ICD-10-CM | POA: Insufficient documentation

## 2016-10-13 DIAGNOSIS — S93491A Sprain of other ligament of right ankle, initial encounter: Secondary | ICD-10-CM | POA: Diagnosis not present

## 2016-10-26 DIAGNOSIS — S93491D Sprain of other ligament of right ankle, subsequent encounter: Secondary | ICD-10-CM | POA: Diagnosis not present

## 2016-11-03 DIAGNOSIS — N390 Urinary tract infection, site not specified: Secondary | ICD-10-CM | POA: Diagnosis not present

## 2016-11-07 DIAGNOSIS — R1032 Left lower quadrant pain: Secondary | ICD-10-CM | POA: Diagnosis not present

## 2016-11-07 DIAGNOSIS — N760 Acute vaginitis: Secondary | ICD-10-CM | POA: Diagnosis not present

## 2016-11-07 DIAGNOSIS — R10819 Abdominal tenderness, unspecified site: Secondary | ICD-10-CM | POA: Diagnosis not present

## 2016-11-14 ENCOUNTER — Other Ambulatory Visit: Payer: Self-pay | Admitting: Family Medicine

## 2016-11-14 DIAGNOSIS — R1032 Left lower quadrant pain: Secondary | ICD-10-CM

## 2016-11-14 DIAGNOSIS — R195 Other fecal abnormalities: Secondary | ICD-10-CM | POA: Diagnosis not present

## 2016-11-14 DIAGNOSIS — E785 Hyperlipidemia, unspecified: Secondary | ICD-10-CM | POA: Diagnosis not present

## 2016-11-16 ENCOUNTER — Ambulatory Visit
Admission: RE | Admit: 2016-11-16 | Discharge: 2016-11-16 | Disposition: A | Discharge status: Home / Self Care | Payer: 59 | Source: Ambulatory Visit | Attending: Family Medicine | Admitting: Family Medicine

## 2016-11-16 DIAGNOSIS — R1032 Left lower quadrant pain: Secondary | ICD-10-CM

## 2016-11-16 DIAGNOSIS — R161 Splenomegaly, not elsewhere classified: Secondary | ICD-10-CM | POA: Diagnosis not present

## 2016-11-16 MED ORDER — IOPAMIDOL (ISOVUE-300) INJECTION 61%
125.0000 mL | Freq: Once | INTRAVENOUS | Status: AC | PRN
  Administered 2016-11-16: 125 mL via INTRAVENOUS

## 2016-11-26 ENCOUNTER — Other Ambulatory Visit: Payer: Self-pay | Admitting: Family Medicine

## 2016-11-26 DIAGNOSIS — R161 Splenomegaly, not elsewhere classified: Secondary | ICD-10-CM

## 2016-12-20 DIAGNOSIS — Z719 Counseling, unspecified: Secondary | ICD-10-CM | POA: Diagnosis not present

## 2017-01-01 DIAGNOSIS — Z719 Counseling, unspecified: Secondary | ICD-10-CM | POA: Diagnosis not present

## 2017-01-08 DIAGNOSIS — Z719 Counseling, unspecified: Secondary | ICD-10-CM | POA: Diagnosis not present

## 2017-01-15 DIAGNOSIS — Z719 Counseling, unspecified: Secondary | ICD-10-CM | POA: Diagnosis not present

## 2017-01-22 DIAGNOSIS — Z719 Counseling, unspecified: Secondary | ICD-10-CM | POA: Diagnosis not present

## 2017-01-23 DIAGNOSIS — N3001 Acute cystitis with hematuria: Secondary | ICD-10-CM | POA: Diagnosis not present

## 2017-01-29 DIAGNOSIS — Z719 Counseling, unspecified: Secondary | ICD-10-CM | POA: Diagnosis not present

## 2017-02-05 DIAGNOSIS — Z719 Counseling, unspecified: Secondary | ICD-10-CM | POA: Diagnosis not present

## 2017-02-12 DIAGNOSIS — Z719 Counseling, unspecified: Secondary | ICD-10-CM | POA: Diagnosis not present

## 2017-02-19 DIAGNOSIS — Z719 Counseling, unspecified: Secondary | ICD-10-CM | POA: Diagnosis not present

## 2017-02-20 DIAGNOSIS — J069 Acute upper respiratory infection, unspecified: Secondary | ICD-10-CM | POA: Diagnosis not present

## 2017-02-20 DIAGNOSIS — J029 Acute pharyngitis, unspecified: Secondary | ICD-10-CM | POA: Diagnosis not present

## 2017-02-20 DIAGNOSIS — R05 Cough: Secondary | ICD-10-CM | POA: Diagnosis not present

## 2017-02-23 DIAGNOSIS — H6980 Other specified disorders of Eustachian tube, unspecified ear: Secondary | ICD-10-CM | POA: Diagnosis not present

## 2017-02-23 DIAGNOSIS — H6691 Otitis media, unspecified, right ear: Secondary | ICD-10-CM | POA: Diagnosis not present

## 2017-02-26 DIAGNOSIS — Z719 Counseling, unspecified: Secondary | ICD-10-CM | POA: Diagnosis not present

## 2017-02-27 DIAGNOSIS — R05 Cough: Secondary | ICD-10-CM | POA: Diagnosis not present

## 2017-03-05 DIAGNOSIS — Z719 Counseling, unspecified: Secondary | ICD-10-CM | POA: Diagnosis not present

## 2017-03-12 DIAGNOSIS — Z719 Counseling, unspecified: Secondary | ICD-10-CM | POA: Diagnosis not present

## 2017-03-19 DIAGNOSIS — Z719 Counseling, unspecified: Secondary | ICD-10-CM | POA: Diagnosis not present

## 2017-04-04 DIAGNOSIS — L723 Sebaceous cyst: Secondary | ICD-10-CM | POA: Diagnosis not present

## 2017-04-04 DIAGNOSIS — Z23 Encounter for immunization: Secondary | ICD-10-CM | POA: Diagnosis not present

## 2017-04-16 DIAGNOSIS — L02214 Cutaneous abscess of groin: Secondary | ICD-10-CM | POA: Diagnosis not present

## 2017-04-18 DIAGNOSIS — J029 Acute pharyngitis, unspecified: Secondary | ICD-10-CM | POA: Diagnosis not present

## 2017-04-18 DIAGNOSIS — J028 Acute pharyngitis due to other specified organisms: Secondary | ICD-10-CM | POA: Diagnosis not present

## 2017-04-18 DIAGNOSIS — R51 Headache: Secondary | ICD-10-CM | POA: Diagnosis not present

## 2017-04-18 DIAGNOSIS — R197 Diarrhea, unspecified: Secondary | ICD-10-CM | POA: Diagnosis not present

## 2017-04-25 DIAGNOSIS — Z0000 Encounter for general adult medical examination without abnormal findings: Secondary | ICD-10-CM | POA: Diagnosis not present

## 2017-05-06 DIAGNOSIS — E785 Hyperlipidemia, unspecified: Secondary | ICD-10-CM | POA: Diagnosis not present

## 2017-05-06 DIAGNOSIS — Z0000 Encounter for general adult medical examination without abnormal findings: Secondary | ICD-10-CM | POA: Diagnosis not present

## 2017-05-23 ENCOUNTER — Ambulatory Visit: Payer: Self-pay | Admitting: Dietician

## 2017-05-29 ENCOUNTER — Encounter: Payer: Self-pay | Admitting: Skilled Nursing Facility1

## 2017-05-29 ENCOUNTER — Encounter: Payer: Self-pay | Attending: Family Medicine | Admitting: Skilled Nursing Facility1

## 2017-05-29 DIAGNOSIS — G40909 Epilepsy, unspecified, not intractable, without status epilepticus: Secondary | ICD-10-CM | POA: Insufficient documentation

## 2017-05-29 DIAGNOSIS — Z713 Dietary counseling and surveillance: Secondary | ICD-10-CM | POA: Insufficient documentation

## 2017-05-29 DIAGNOSIS — E663 Overweight: Secondary | ICD-10-CM

## 2017-05-29 NOTE — Patient Instructions (Addendum)
-  Re frame your negative thoughts to a positive   -I have a problem, How do I fix it without food?  -Find a counselor   -Eat every 3-5 hours every day  -Have vegetables with every lunch and every dinner every day

## 2017-05-29 NOTE — Progress Notes (Signed)
  Assessment:  Primary concerns today: referred for weight loss. Pt states she was 245 pounds and is now down to 215 pounds due to counting calories using My Fitness Pal with 1390 calories (down from 1500 but has been doing 1390 since October). Pt states she has a very busy schedule which keeps her from being active. Pt states she has a toddler and a 35 year old. Pt states she has tried the keto diet for her epilepsy but she continued to have seizures, states her current medication works very well and she has not had a seizure in 6 years.   To work on next times: Physical activity   MEDICATIONS: See List    DIETARY INTAKE:  Usual eating pattern includes 3 meals and 3 snacks per day.  Everyday foods include none stated.  Avoided foods include none stated.    24-hr recall:  B ( AM): skipped---egg and muffin or egg mcmuffin Snk ( AM):  L ( PM): deli meat sandwich or soup Snk ( PM):  D ( PM): protein vegetable and carbohydrate----tacos and salad Snk ( PM): increased snacking: bag of popcorn and a couple pieces of bread with peanutbutter  Beverages:   Usual physical activity: ADL's  Estimated energy needs: 1400 calories 158 g carbohydrates 105 g protein 39 g fat  Progress Towards Goal(s):  In progress.   Nutritional Diagnosis:  NB-1.1 Food and nutrition-related knowledge deficit As related to no prior nutrition education from a nutrition prefessional.  As evidenced by pt report.    Intervention:  Nutrition counseling. Dietitian educated the pt on healthy eating. Goals: -Re frame your negative thoughts to a positive  -I have a problem, How do I fix it without food? -Find a counselor  -Eat every 3-5 hours every day -Have vegetables with every lunch and every dinner every day  Teaching Method Utilized: Visual Auditory Hands on  Handouts given during visit include:  Counselor sheet  Needs  Feelings  Should I eat  Barriers to learning/adherence to lifestyle change:  emotional eating   Demonstrated degree of understanding via:  Teach Back   Monitoring/Evaluation:  Dietary intake, exercise, and body weight prn.

## 2017-06-06 DIAGNOSIS — S39012A Strain of muscle, fascia and tendon of lower back, initial encounter: Secondary | ICD-10-CM | POA: Diagnosis not present

## 2017-08-01 DIAGNOSIS — R300 Dysuria: Secondary | ICD-10-CM | POA: Diagnosis not present

## 2017-08-26 DIAGNOSIS — R42 Dizziness and giddiness: Secondary | ICD-10-CM | POA: Diagnosis not present

## 2017-08-26 DIAGNOSIS — I69398 Other sequelae of cerebral infarction: Secondary | ICD-10-CM | POA: Diagnosis not present

## 2017-08-26 DIAGNOSIS — Z9889 Other specified postprocedural states: Secondary | ICD-10-CM | POA: Diagnosis not present

## 2017-09-02 DIAGNOSIS — Z01419 Encounter for gynecological examination (general) (routine) without abnormal findings: Secondary | ICD-10-CM | POA: Diagnosis not present

## 2017-10-24 ENCOUNTER — Ambulatory Visit
Admission: RE | Admit: 2017-10-24 | Discharge: 2017-10-24 | Disposition: A | Discharge status: Home / Self Care | Payer: Worker's Compensation | Source: Ambulatory Visit | Attending: Nurse Practitioner | Admitting: Nurse Practitioner

## 2017-10-24 ENCOUNTER — Other Ambulatory Visit: Payer: Self-pay | Admitting: Nurse Practitioner

## 2017-10-24 DIAGNOSIS — M545 Low back pain: Secondary | ICD-10-CM

## 2017-10-30 DIAGNOSIS — M5136 Other intervertebral disc degeneration, lumbar region: Secondary | ICD-10-CM | POA: Diagnosis not present

## 2017-10-30 DIAGNOSIS — M545 Low back pain: Secondary | ICD-10-CM | POA: Diagnosis not present

## 2017-11-13 DIAGNOSIS — M545 Low back pain, unspecified: Secondary | ICD-10-CM | POA: Insufficient documentation

## 2017-12-09 DIAGNOSIS — M5126 Other intervertebral disc displacement, lumbar region: Secondary | ICD-10-CM | POA: Insufficient documentation

## 2017-12-09 DIAGNOSIS — M5136 Other intervertebral disc degeneration, lumbar region: Secondary | ICD-10-CM | POA: Insufficient documentation

## 2017-12-09 DIAGNOSIS — S83006A Unspecified dislocation of unspecified patella, initial encounter: Secondary | ICD-10-CM | POA: Insufficient documentation

## 2018-03-24 DIAGNOSIS — Z23 Encounter for immunization: Secondary | ICD-10-CM | POA: Diagnosis not present

## 2018-04-17 ENCOUNTER — Emergency Department (HOSPITAL_COMMUNITY): Payer: 59

## 2018-04-17 ENCOUNTER — Encounter (HOSPITAL_COMMUNITY): Payer: Self-pay | Admitting: Emergency Medicine

## 2018-04-17 ENCOUNTER — Emergency Department (HOSPITAL_COMMUNITY)
Admission: EM | Admit: 2018-04-17 | Discharge: 2018-04-18 | Disposition: A | Discharge status: Home / Self Care | Payer: 59 | Attending: Emergency Medicine | Admitting: Emergency Medicine

## 2018-04-17 DIAGNOSIS — Z79899 Other long term (current) drug therapy: Secondary | ICD-10-CM | POA: Insufficient documentation

## 2018-04-17 DIAGNOSIS — Z87891 Personal history of nicotine dependence: Secondary | ICD-10-CM | POA: Diagnosis not present

## 2018-04-17 DIAGNOSIS — Z7982 Long term (current) use of aspirin: Secondary | ICD-10-CM | POA: Diagnosis not present

## 2018-04-17 DIAGNOSIS — R42 Dizziness and giddiness: Secondary | ICD-10-CM

## 2018-04-17 DIAGNOSIS — I959 Hypotension, unspecified: Secondary | ICD-10-CM | POA: Diagnosis not present

## 2018-04-17 DIAGNOSIS — Z8673 Personal history of transient ischemic attack (TIA), and cerebral infarction without residual deficits: Secondary | ICD-10-CM | POA: Diagnosis not present

## 2018-04-17 LAB — CBC WITH DIFFERENTIAL/PLATELET
Abs Immature Granulocytes: 0.05 10*3/uL (ref 0.00–0.07)
Basophils Absolute: 0 10*3/uL (ref 0.0–0.1)
Basophils Relative: 0 %
Eosinophils Absolute: 0.2 10*3/uL (ref 0.0–0.5)
Eosinophils Relative: 2 %
HCT: 38.3 % (ref 36.0–46.0)
Hemoglobin: 11.1 g/dL — ABNORMAL LOW (ref 12.0–15.0)
Immature Granulocytes: 1 %
Lymphocytes Relative: 21 %
Lymphs Abs: 2.1 10*3/uL (ref 0.7–4.0)
MCH: 19.4 pg — ABNORMAL LOW (ref 26.0–34.0)
MCHC: 29 g/dL — ABNORMAL LOW (ref 30.0–36.0)
MCV: 67.1 fL — ABNORMAL LOW (ref 80.0–100.0)
Monocytes Absolute: 0.3 10*3/uL (ref 0.1–1.0)
Monocytes Relative: 3 %
Neutro Abs: 7.4 10*3/uL (ref 1.7–7.7)
Neutrophils Relative %: 73 %
Platelets: 238 10*3/uL (ref 150–400)
RBC: 5.71 MIL/uL — ABNORMAL HIGH (ref 3.87–5.11)
RDW: 14.5 % (ref 11.5–15.5)
WBC: 10 10*3/uL (ref 4.0–10.5)
nRBC: 0 % (ref 0.0–0.2)

## 2018-04-17 LAB — URINALYSIS, ROUTINE W REFLEX MICROSCOPIC
Bilirubin Urine: NEGATIVE
Glucose, UA: NEGATIVE mg/dL
Hgb urine dipstick: NEGATIVE
Ketones, ur: NEGATIVE mg/dL
Nitrite: NEGATIVE
Protein, ur: NEGATIVE mg/dL
Specific Gravity, Urine: 1.003 — ABNORMAL LOW (ref 1.005–1.030)
pH: 6 (ref 5.0–8.0)

## 2018-04-17 LAB — BASIC METABOLIC PANEL
Anion gap: 7 (ref 5–15)
BUN: 11 mg/dL (ref 6–20)
CO2: 25 mmol/L (ref 22–32)
Calcium: 9.7 mg/dL (ref 8.9–10.3)
Chloride: 108 mmol/L (ref 98–111)
Creatinine, Ser: 0.69 mg/dL (ref 0.44–1.00)
GFR calc Af Amer: >60 mL/min (ref >60)
GFR calc non Af Amer: >60 mL/min (ref >60)
Glucose, Bld: 104 mg/dL — ABNORMAL HIGH (ref 70–99)
Potassium: 3.9 mmol/L (ref 3.5–5.1)
Sodium: 140 mmol/L (ref 135–145)

## 2018-04-17 LAB — PREGNANCY, URINE: Preg Test, Ur: NEGATIVE

## 2018-04-17 MED ORDER — SODIUM CHLORIDE 0.9 % IV BOLUS
1000.0000 mL | Freq: Once | INTRAVENOUS | Status: AC
  Administered 2018-04-17: 1000 mL via INTRAVENOUS

## 2018-04-17 MED ORDER — MECLIZINE HCL 25 MG PO TABS
25.0000 mg | ORAL_TABLET | Freq: Once | ORAL | Status: AC
  Administered 2018-04-18: 25 mg via ORAL
  Filled 2018-04-17: qty 1

## 2018-04-17 MED ORDER — DIAZEPAM 5 MG PO TABS
5.0000 mg | ORAL_TABLET | Freq: Once | ORAL | Status: AC
  Administered 2018-04-17: 5 mg via ORAL
  Filled 2018-04-17: qty 1

## 2018-04-17 MED ORDER — GADOBUTROL 1 MMOL/ML IV SOLN
10.0000 mL | Freq: Once | INTRAVENOUS | Status: AC | PRN
  Administered 2018-04-17: 10 mL via INTRAVENOUS

## 2018-04-17 NOTE — ED Triage Notes (Signed)
Pt here from work with c/o dizziness , better lying down , cbg 91 , pt has history of stroke

## 2018-04-17 NOTE — ED Notes (Signed)
Patient currently at MRI

## 2018-04-18 DIAGNOSIS — R42 Dizziness and giddiness: Secondary | ICD-10-CM | POA: Diagnosis not present

## 2018-04-18 MED ORDER — MECLIZINE HCL 25 MG PO TABS: 25.0000 mg | ORAL_TABLET | Freq: Three times a day (TID) | ORAL | 0 refills | Status: DC | PRN

## 2018-04-18 MED ORDER — DIAZEPAM 5 MG PO TABS: 5.0000 mg | ORAL_TABLET | Freq: Three times a day (TID) | ORAL | 0 refills | Status: DC | PRN

## 2018-04-18 NOTE — Discharge Instructions (Addendum)
Follow-up with your neurologist.  Return here as needed.  Increase your fluid intake °

## 2018-04-18 NOTE — ED Provider Notes (Signed)
[name] EMERGENCY DEPARTMENT Provider Note   CSN: [id number] Arrival date & time: 04/17/18  1817     History   Chief Complaint Chief Complaint  Patient presents with  . Dizziness    HPI Marilyn Black is a 37 y.o. female.  HPI Patient presents to the emergency department with vertigo that started at 4:45 PM.  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  She states that this does not feel similar to that.  Although she does have increased symptoms with position changes.  Patient states she did not take any medications prior to arrival.  Patient states nothing seems to make the condition better but certain positions and movements make the condition worse.  The patient denies chest pain, shortness of breath, headache,blurred vision, neck pain, fever, cough, weakness, numbness,anorexia, edema, abdominal pain, nausea, vomiting, diarrhea, rash, back pain, dysuria, hematemesis, bloody stool, near syncope, or syncope. Past Medical History:  Diagnosis Date  . Gestational diabetes    diet controlled  . Gestational diabetes mellitus (GDM), antepartum   . Nocturnal epilepsy (HCC)   . Postpartum care following vaginal delivery 01/09/2011  . Seizure disorder (HCC) 01/09/2011  . Seizures (HCC)   . Stroke (HCC)   . Thalassemia   . Thalassemia minor   . Thalassemia trait     Patient Active Problem List   Diagnosis Date Noted  . Seizures (HCC) 01/17/2016  . Stroke (cerebrum) (HCC)   . Postpartum hemorrhage of vagina 11/14/2015  . Hyperlipidemia with target LDL less than 70 11/14/2015  . 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 Neck pain 11/13/2015  . 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 SVD (spontaneous vaginal delivery) 10/06/2015  . Postpartum care following vaginal delivery (4/13) 10/06/2015  . Postpartum state 10/06/2015  . Labor and delivery, indication for care 10/05/2015  . Seizure  disorder (HCC) 01/09/2011    Past Surgical History:  Procedure Laterality Date  . WISDOM TOOTH EXTRACTION  2002     OB History    Gravida  3   Para  2   Term  2   Preterm      AB  1   Living  2     SAB  1   TAB      Ectopic      Multiple  0   Live Births  2            Home Medications    Prior to Admission medications   Medication Sig Start Date End Date Taking? Authorizing Provider  aspirin EC 325 MG tablet Take 1 tablet (325 mg total) by mouth daily. 11/14/15   Dhungel, Theda Belfast, MD  Cholecalciferol (VITAMIN D) 2000 units tablet Take 4,000 Units by mouth at bedtime.    [provider]  Fenugreek 500 MG CAPS Take 500 mg by mouth 3 (three) times daily.    [provider]  folic acid (FOLVITE) 1 MG tablet 4 mg.  12/19/15   [provider]  Oxcarbazepine (TRILEPTAL) 300 MG tablet Take 300 mg by mouth 2 (two) times daily.     [provider]  Prenatal Multivit-Min-Fe-FA (PRENATAL VITAMINS PO) Take 1 tablet by mouth at bedtime.     [provider]    Family History Family History  Problem Relation Age of Onset  . Cancer Father   . Diabetes Mother   . Hypertension Mother   . Diabetes Sister   .  Mental illness Sister        bipolar  . Stroke Sister   . Cancer Maternal Grandmother   . Diabetes Maternal Grandmother   . Hypertension Maternal Grandmother   . Cancer Maternal Grandfather     Social History Social History   Tobacco Use  . Smoking status: Former Smoker    Last attempt to quit: 07/09/2005    Years since quitting: 12.7  . Smokeless tobacco: Never Used  Substance Use Topics  . Alcohol use: No  . Drug use: No     Allergies   Codeine; Penicillins; Sulfa antibiotics; and Erythromycin   Review of Systems Review of Systems All other systems negative except as documented in the HPI. All pertinent positives and negatives as reviewed in the HPI. Physical Exam Updated Vital Signs BP 102/69 (BP  Location: Left Arm)   Pulse 79   Temp 98.1 F (36.7 C) (Oral)   Resp 16   SpO2 100%   Physical Exam  Constitutional: She is oriented to person, place, and time. She appears well-developed and well-nourished. No distress.  HENT:  Head: Normocephalic and atraumatic.  Mouth/Throat: Oropharynx is clear and moist.  Eyes: Pupils are equal, round, and reactive to light.  Neck: Normal range of motion. Neck supple.  Cardiovascular: Normal rate, regular rhythm and normal heart sounds. Exam reveals no gallop and no friction rub.  No murmur heard. Pulmonary/Chest: Effort normal and breath sounds normal. No respiratory distress. She has no wheezes.  Abdominal: Soft. Bowel sounds are normal. She exhibits no distension. There is no tenderness.  Neurological: She is alert and oriented to person, place, and time. No sensory deficit. She exhibits normal muscle tone. Coordination normal.  Patient has normal heel-to-shin and finger-nose testing.  Skin: Skin is warm and dry. Capillary refill takes less than 2 seconds. No rash noted. No erythema.  Psychiatric: She has a normal mood and affect. Her behavior is normal.  Nursing note and vitals reviewed.    ED Treatments / Results  Labs (all labs ordered are listed, but only abnormal results are displayed) Labs Reviewed  URINALYSIS, ROUTINE W REFLEX MICROSCOPIC - Abnormal; Notable for the following components:      Result Value   Color, Urine COLORLESS (*)    Specific Gravity, Urine 1.003 (*)    Leukocytes, UA TRACE (*)    Bacteria, UA RARE (*)    All other components within normal limits  BASIC METABOLIC PANEL - Abnormal; Notable for the following components:   Glucose, Bld 104 (*)    All other components within normal limits  CBC WITH DIFFERENTIAL/PLATELET - Abnormal; Notable for the following components:   RBC 5.71 (*)    Hemoglobin 11.1 (*)    MCV 67.1 (*)    MCH 19.4 (*)    MCHC 29.0 (*)    All other components within normal limits    PREGNANCY, URINE    EKG None  Radiology Mr Angiogram Neck W Or Wo Contrast  Result Date: 04/17/2018 CLINICAL DATA:  Dizziness at work. History of stroke and LEFT vertebral artery dissection, seizure, thalassemia. EXAM: MRI HEAD WITHOUT CONTRAST MRA HEAD WITHOUT CONTRAST MRA NECK WITHOUT AND WITH CONTRAST TECHNIQUE: Multiplanar, multiecho pulse sequences of the brain and surrounding structures were obtained without intravenous contrast. Angiographic images of the Circle of Willis were obtained using MRA technique without intravenous contrast. Angiographic images of the neck were obtained using MRA technique without and with intravenous contrast. Carotid stenosis measurements (when applicable) are obtained utilizing NASCET  criteria, using the distal internal carotid diameter as the denominator. CONTRAST:  10 cc Gadavist COMPARISON:  MRI head Nov 14, 2015, CT angiogram neck May 09, 2016. FINDINGS: MRI HEAD FINDINGS INTRACRANIAL CONTENTS: No reduced diffusion to suggest acute ischemia or hyperacute demyelination. No susceptibility artifact to suggest hemorrhage. Old small LEFT inferior cerebellar infarct. The ventricles and sulci are normal for patient's age. No suspicious parenchymal signal, masses, mass effect. No abnormal extra-axial fluid collections. No extra-axial masses. VASCULAR: Normal major intracranial vascular flow voids present at skull base. SKULL AND UPPER CERVICAL SPINE: No abnormal sellar expansion. No suspicious calvarial bone marrow signal. Craniocervical junction maintained. SINUSES/ORBITS: Trace paranasal sinus mucosal thickening. Mastoid air cells are well aerated.The included ocular globes and orbital contents are non-suspicious. OTHER: None. MRA HEAD FINDINGS ANTERIOR CIRCULATION: Normal flow related enhancement of the included cervical, petrous, cavernous and supraclinoid internal carotid arteries. Patent anterior communicating artery. Patent anterior and middle cerebral  arteries. No large vessel occlusion, flow limiting stenosis, aneurysm. POSTERIOR CIRCULATION: Codominant vertebral arteries. Vertebrobasilar arteries are patent, with normal flow related enhancement of the main branch vessels. Patent posterior cerebral arteries. No large vessel occlusion, flow limiting stenosis,  aneurysm. ANATOMIC VARIANTS: None. Source images and MIP images were reviewed. MRA NECK FINDINGS ANTERIOR CIRCULATION: The common carotid arteries are widely patent bilaterally. The carotid bifurcations are patent bilaterally without hemodynamically significant stenosis by NASCET criteria. No flow limiting stenosis or luminal irregularity. POSTERIOR CIRCULATION: Bilateral vertebral arteries are patent to the vertebrobasilar junction. No flow limiting stenosis or luminal irregularity. Source images and MIP images were reviewed. IMPRESSION: MRI HEAD: 1. No acute intracranial process. 2. Old small LEFT cerebellar infarcts; otherwise negative noncontrast MRI head. MRA HEAD: 1. Normal MRA head without contrast. MRA NECK: 1. Normal MRA neck with contrast. Electronically Signed   By: Awilda Metro M.D.   On: 04/17/2018 23:19   Mr Brain Wo Contrast (neuro Protocol)  Result Date: 04/17/2018 CLINICAL DATA:  Dizziness at work. History of stroke and LEFT vertebral artery dissection, seizure, thalassemia. EXAM: MRI HEAD WITHOUT CONTRAST MRA HEAD WITHOUT CONTRAST MRA NECK WITHOUT AND WITH CONTRAST TECHNIQUE: Multiplanar, multiecho pulse sequences of the brain and surrounding structures were obtained without intravenous contrast. Angiographic images of the Circle of Willis were obtained using MRA technique without intravenous contrast. Angiographic images of the neck were obtained using MRA technique without and with intravenous contrast. Carotid stenosis measurements (when applicable) are obtained utilizing NASCET criteria, using the distal internal carotid diameter as the denominator. CONTRAST:  10 cc Gadavist  COMPARISON:  MRI head Nov 14, 2015, CT angiogram neck May 09, 2016. FINDINGS: MRI HEAD FINDINGS INTRACRANIAL CONTENTS: No reduced diffusion to suggest acute ischemia or hyperacute demyelination. No susceptibility artifact to suggest hemorrhage. Old small LEFT inferior cerebellar infarct. The ventricles and sulci are normal for patient's age. No suspicious parenchymal signal, masses, mass effect. No abnormal extra-axial fluid collections. No extra-axial masses. VASCULAR: Normal major intracranial vascular flow voids present at skull base. SKULL AND UPPER CERVICAL SPINE: No abnormal sellar expansion. No suspicious calvarial bone marrow signal. Craniocervical junction maintained. SINUSES/ORBITS: Trace paranasal sinus mucosal thickening. Mastoid air cells are well aerated.The included ocular globes and orbital contents are non-suspicious. OTHER: None. MRA HEAD FINDINGS ANTERIOR CIRCULATION: Normal flow related enhancement of the included cervical, petrous, cavernous and supraclinoid internal carotid arteries. Patent anterior communicating artery. Patent anterior and middle cerebral arteries. No large vessel occlusion, flow limiting stenosis, aneurysm. POSTERIOR CIRCULATION: Codominant vertebral arteries. Vertebrobasilar arteries are patent, with normal flow related  enhancement of the main branch vessels. Patent posterior cerebral arteries. No large vessel occlusion, flow limiting stenosis,  aneurysm. ANATOMIC VARIANTS: None. Source images and MIP images were reviewed. MRA NECK FINDINGS ANTERIOR CIRCULATION: The common carotid arteries are widely patent bilaterally. The carotid bifurcations are patent bilaterally without hemodynamically significant stenosis by NASCET criteria. No flow limiting stenosis or luminal irregularity. POSTERIOR CIRCULATION: Bilateral vertebral arteries are patent to the vertebrobasilar junction. No flow limiting stenosis or luminal irregularity. Source images and MIP images were reviewed.  IMPRESSION: MRI HEAD: 1. No acute intracranial process. 2. Old small LEFT cerebellar infarcts; otherwise negative noncontrast MRI head. MRA HEAD: 1. Normal MRA head without contrast. MRA NECK: 1. Normal MRA neck with contrast. Electronically Signed   By: Awilda Metro M.D.   On: 04/17/2018 23:19   Mr Maxine Glenn Head (cerebral Arteries)  Result Date: 04/17/2018 CLINICAL DATA:  Dizziness at work. History of stroke and LEFT vertebral artery dissection, seizure, thalassemia. EXAM: MRI HEAD WITHOUT CONTRAST MRA HEAD WITHOUT CONTRAST MRA NECK WITHOUT AND WITH CONTRAST TECHNIQUE: Multiplanar, multiecho pulse sequences of the brain and surrounding structures were obtained without intravenous contrast. Angiographic images of the Circle of Willis were obtained using MRA technique without intravenous contrast. Angiographic images of the neck were obtained using MRA technique without and with intravenous contrast. Carotid stenosis measurements (when applicable) are obtained utilizing NASCET criteria, using the distal internal carotid diameter as the denominator. CONTRAST:  10 cc Gadavist COMPARISON:  MRI head Nov 14, 2015, CT angiogram neck May 09, 2016. FINDINGS: MRI HEAD FINDINGS INTRACRANIAL CONTENTS: No reduced diffusion to suggest acute ischemia or hyperacute demyelination. No susceptibility artifact to suggest hemorrhage. Old small LEFT inferior cerebellar infarct. The ventricles and sulci are normal for patient's age. No suspicious parenchymal signal, masses, mass effect. No abnormal extra-axial fluid collections. No extra-axial masses. VASCULAR: Normal major intracranial vascular flow voids present at skull base. SKULL AND UPPER CERVICAL SPINE: No abnormal sellar expansion. No suspicious calvarial bone marrow signal. Craniocervical junction maintained. SINUSES/ORBITS: Trace paranasal sinus mucosal thickening. Mastoid air cells are well aerated.The included ocular globes and orbital contents are non-suspicious.  OTHER: None. MRA HEAD FINDINGS ANTERIOR CIRCULATION: Normal flow related enhancement of the included cervical, petrous, cavernous and supraclinoid internal carotid arteries. Patent anterior communicating artery. Patent anterior and middle cerebral arteries. No large vessel occlusion, flow limiting stenosis, aneurysm. POSTERIOR CIRCULATION: Codominant vertebral arteries. Vertebrobasilar arteries are patent, with normal flow related enhancement of the main branch vessels. Patent posterior cerebral arteries. No large vessel occlusion, flow limiting stenosis,  aneurysm. ANATOMIC VARIANTS: None. Source images and MIP images were reviewed. MRA NECK FINDINGS ANTERIOR CIRCULATION: The common carotid arteries are widely patent bilaterally. The carotid bifurcations are patent bilaterally without hemodynamically significant stenosis by NASCET criteria. No flow limiting stenosis or luminal irregularity. POSTERIOR CIRCULATION: Bilateral vertebral arteries are patent to the vertebrobasilar junction. No flow limiting stenosis or luminal irregularity. Source images and MIP images were reviewed. IMPRESSION: MRI HEAD: 1. No acute intracranial process. 2. Old small LEFT cerebellar infarcts; otherwise negative noncontrast MRI head. MRA HEAD: 1. Normal MRA head without contrast. MRA NECK: 1. Normal MRA neck with contrast. Electronically Signed   By: Awilda Metro M.D.   On: 04/17/2018 23:19    Procedures Procedures (including critical care time)  Medications Ordered in ED Medications  sodium chloride 0.9 % bolus 1,000 mL (1,000 mLs Intravenous New Bag/Given 04/17/18 2356)  meclizine (ANTIVERT) tablet 25 mg (has no administration in time range)  diazepam (VALIUM) tablet 5  mg (5 mg Oral Given 04/17/18 2111)  gadobutrol (GADAVIST) 1 MMOL/ML injection 10 mL (10 mLs Intravenous Contrast Given 04/17/18 2224)     Initial Impression / Assessment and Plan / ED Course  I have reviewed the triage vital signs and the nursing  notes.  Pertinent labs & imaging results that were available during my care of the patient were reviewed by me and considered in my medical decision making (see chart for details).     I spoke with neurology due to the fact she did have a previous stroke.  They reviewed her information.  Her previous stroke was due to a vertebral artery dissection.  The patient has negative MRA of head and neck along with an MRI of the brain.  I feel that this is most likely peripheral vertigo based on the fact that she does have changes with position.  Patient is advised to follow-up with her neurologist told to return here as needed.  Patient is feeling better following Valium.  Final Clinical Impressions(s) / ED Diagnoses   Final diagnoses:  None    ED Discharge Orders    None       Charlestine Night, PA-C 04/18/18 0012    Little, Ambrose Finland, MD 04/20/18 1025

## 2018-05-01 DIAGNOSIS — Z0000 Encounter for general adult medical examination without abnormal findings: Secondary | ICD-10-CM | POA: Diagnosis not present

## 2018-05-13 DIAGNOSIS — G43019 Migraine without aura, intractable, without status migrainosus: Secondary | ICD-10-CM | POA: Diagnosis not present

## 2018-05-14 ENCOUNTER — Other Ambulatory Visit: Payer: Self-pay

## 2018-05-14 ENCOUNTER — Encounter (HOSPITAL_BASED_OUTPATIENT_CLINIC_OR_DEPARTMENT_OTHER): Payer: Self-pay | Admitting: *Deleted

## 2018-05-14 ENCOUNTER — Emergency Department (HOSPITAL_BASED_OUTPATIENT_CLINIC_OR_DEPARTMENT_OTHER): Payer: 59

## 2018-05-14 ENCOUNTER — Emergency Department (HOSPITAL_BASED_OUTPATIENT_CLINIC_OR_DEPARTMENT_OTHER)
Admission: EM | Admit: 2018-05-14 | Discharge: 2018-05-14 | Disposition: A | Discharge status: Home / Self Care | Payer: 59 | Attending: Emergency Medicine | Admitting: Emergency Medicine

## 2018-05-14 DIAGNOSIS — Z87891 Personal history of nicotine dependence: Secondary | ICD-10-CM | POA: Diagnosis not present

## 2018-05-14 DIAGNOSIS — D569 Thalassemia, unspecified: Secondary | ICD-10-CM | POA: Insufficient documentation

## 2018-05-14 DIAGNOSIS — G40909 Epilepsy, unspecified, not intractable, without status epilepticus: Secondary | ICD-10-CM | POA: Insufficient documentation

## 2018-05-14 DIAGNOSIS — Z79899 Other long term (current) drug therapy: Secondary | ICD-10-CM | POA: Diagnosis not present

## 2018-05-14 DIAGNOSIS — R51 Headache: Secondary | ICD-10-CM | POA: Insufficient documentation

## 2018-05-14 DIAGNOSIS — R519 Headache, unspecified: Secondary | ICD-10-CM

## 2018-05-14 LAB — PREGNANCY, URINE: Preg Test, Ur: NEGATIVE

## 2018-05-14 MED ORDER — METOCLOPRAMIDE HCL 10 MG PO TABS: 10.0000 mg | ORAL_TABLET | Freq: Four times a day (QID) | ORAL | 0 refills | Status: DC | PRN

## 2018-05-14 MED ORDER — KETOROLAC TROMETHAMINE 30 MG/ML IJ SOLN
30.0000 mg | Freq: Once | INTRAMUSCULAR | Status: AC
  Administered 2018-05-14: 30 mg via INTRAVENOUS
  Filled 2018-05-14: qty 1

## 2018-05-14 MED ORDER — METOCLOPRAMIDE HCL 5 MG/ML IJ SOLN
10.0000 mg | Freq: Once | INTRAMUSCULAR | Status: AC
  Administered 2018-05-14: 10 mg via INTRAVENOUS
  Filled 2018-05-14: qty 2

## 2018-05-14 MED ORDER — DIPHENHYDRAMINE HCL 50 MG/ML IJ SOLN
25.0000 mg | Freq: Once | INTRAMUSCULAR | Status: AC
  Administered 2018-05-14: 25 mg via INTRAVENOUS
  Filled 2018-05-14: qty 1

## 2018-05-14 MED ORDER — SODIUM CHLORIDE 0.9 % IV BOLUS
1000.0000 mL | Freq: Once | INTRAVENOUS | Status: AC
  Administered 2018-05-14: 1000 mL via INTRAVENOUS

## 2018-05-14 NOTE — ED Notes (Signed)
ED Provider at bedside. 

## 2018-05-14 NOTE — ED Triage Notes (Signed)
Pt c/o h/a x 4 days, seen by PMD yesterday given Toradol h/a improved but returned this am

## 2018-05-14 NOTE — ED Notes (Signed)
Pt endorses mild relief of headache

## 2018-05-14 NOTE — Discharge Instructions (Signed)
Please read instructions below. You can take 600 mg of Advil/ibuprofen every 6 hours as needed for headache. You can take reglan every 6 hours with benadryl as needed for headache or nausea. Schedule an appointment with your primary care provider to follow up on your headache and discuss preventative treatment. Return to the ER for severely worsening headache, vision changes, fever, weakness or numbness, or new or concerning symptoms.

## 2018-05-14 NOTE — ED Provider Notes (Addendum)
[facility] EMERGENCY DEPARTMENT Provider Note   CSN: [id number] Arrival date & time: 05/14/18  1737     History   Chief Complaint Chief Complaint  Patient presents with  . Headache    HPI Marilyn Black is a 37 y.o. 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 Pt states pain is on the left side of her head described as throbbing and painful to the touch. She endorses assoc photophobia and phonophobia as well as mild nausea. Was evaluated by her PCP yesterday who treated with toradol which gave minor improvement however persistent today. She denies prior hx of headaches. Symptoms are not similar to hx of stroke. She denies fever, neck pain or stiffness, vision loss, diplopia.   The history is provided by the patient.    Past Medical History:  Diagnosis Date  . Gestational diabetes    diet controlled  . Gestational diabetes mellitus (GDM), antepartum   . Nocturnal epilepsy (HCC)   . Postpartum care following vaginal delivery 01/09/2011  . Seizure disorder (HCC) 01/09/2011  . Seizures (HCC)   . Stroke (HCC)   . Thalassemia   . Thalassemia minor   . Thalassemia trait     Patient Active Problem List   Diagnosis Date Noted  . Seizures (HCC) 01/17/2016  . Stroke (cerebrum) (HCC)   . Postpartum hemorrhage of vagina 11/14/2015  . Hyperlipidemia with target LDL less than 70 11/14/2015  . 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 Neck pain 11/13/2015  . 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 SVD (spontaneous vaginal delivery) 10/06/2015  . Postpartum care following vaginal delivery (4/13) 10/06/2015  . Postpartum state 10/06/2015  . Labor and delivery, indication for care 10/05/2015  . Seizure disorder (HCC) 01/09/2011    Past Surgical History:  Procedure Laterality Date  . WISDOM TOOTH EXTRACTION  2002     OB  History    Gravida  3   Para  2   Term  2   Preterm      AB  1   Living  2     SAB  1   TAB      Ectopic      Multiple  0   Live Births  2            Home Medications    Prior to Admission medications   Medication Sig Start Date End Date Taking? Authorizing Provider  aspirin EC 325 MG tablet Take 1 tablet (325 mg total) by mouth daily. 11/14/15   Dhungel, Theda Belfast, MD  Cholecalciferol (VITAMIN D) 2000 units tablet Take 4,000 Units by mouth at bedtime.    [provider]  diazepam (VALIUM) 5 MG tablet Take 1 tablet (5 mg total) by mouth every 8 (eight) hours as needed for anxiety. If the meclizine is not helping you can take this 04/18/18   Charlestine Night, PA-C  Fenugreek 500 MG CAPS Take 500 mg by mouth 3 (three) times daily.    [provider]  folic acid (FOLVITE) 1 MG tablet 4 mg.  12/19/15   [provider]  meclizine (ANTIVERT) 25 MG tablet Take 1 tablet (25 mg total) by mouth 3 (three) times daily as needed for dizziness. 04/18/18   Lawyer, Cristal Deer, PA-C  metoCLOPramide (REGLAN) 10 MG tablet Take 1 tablet (10 mg total) by mouth every 6 (six) hours as  needed for nausea (headache). 05/14/18   Chelsie Burel, [place] N, PA-C  Oxcarbazepine (TRILEPTAL) 300 MG tablet Take 300 mg by mouth 2 (two) times daily.     [provider]  Prenatal Multivit-Min-Fe-FA (PRENATAL VITAMINS PO) Take 1 tablet by mouth at bedtime.     [provider]    Family History Family History  Problem Relation Age of Onset  . Cancer Father   . Diabetes Mother   . Hypertension Mother   . Diabetes Sister   . Mental illness Sister        bipolar  . Stroke Sister   . Cancer Maternal Grandmother   . Diabetes Maternal Grandmother   . Hypertension Maternal Grandmother   . Cancer Maternal Grandfather     Social History Social History   Tobacco Use  . Smoking status: Former Smoker    Last attempt to quit: 07/09/2005    Years since quitting:  12.8  . Smokeless tobacco: Never Used  Substance Use Topics  . Alcohol use: No  . Drug use: No     Allergies   Codeine; Penicillins; Sulfa antibiotics; and Erythromycin   Review of Systems Review of Systems  Constitutional: Negative for fever.  Eyes: Positive for photophobia. Negative for visual disturbance.  Gastrointestinal: Positive for nausea. Negative for vomiting.  Musculoskeletal: Negative for neck pain and neck stiffness.  Neurological: Positive for headaches. Negative for dizziness and weakness.  All other systems reviewed and are negative.    Physical Exam Updated Vital Signs BP 106/66 (BP Location: Left Arm)   Pulse 65   Temp 98.1 F (36.7 C) (Oral)   Resp 16   Ht 5\' 4"  (1.626 m)   Wt 106.6 kg   LMP 05/09/2018   SpO2 98%   BMI 40.34 kg/m   Physical Exam  Constitutional: She is oriented to person, place, and time. She appears well-developed and well-nourished. No distress.  HENT:  Head: Normocephalic and atraumatic.  Temples not tender. No palpable cords  Eyes: Pupils are equal, round, and reactive to light. Conjunctivae and EOM are normal.  Neck: Normal range of motion. Neck supple.  No meningismus  Cardiovascular: Normal rate, regular rhythm and normal heart sounds.  Pulmonary/Chest: Effort normal and breath sounds normal. No respiratory distress.  Abdominal: Soft. Bowel sounds are normal. She exhibits no distension. There is no tenderness. There is no guarding.  Neurological: She is alert and oriented to person, place, and time.  Mental Status:  Alert, oriented, thought content appropriate, able to give a coherent history. Speech fluent without evidence of aphasia. Able to follow 2 step commands without difficulty.  Cranial Nerves:  II:  Peripheral visual fields grossly normal, pupils equal, round, reactive to light III,IV, VI: ptosis not present, extra-ocular motions intact bilaterally  V,VII: smile symmetric, facial light touch sensation  equal VIII: hearing grossly normal to voice  X: uvula elevates symmetrically  XI: bilateral shoulder shrug symmetric and strong XII: midline tongue extension without fassiculations Motor:  Normal tone. 5/5 in upper and lower extremities bilaterally including strong and equal grip strength and dorsiflexion/plantar flexion Sensory: Pinprick and light touch normal in all extremities.  Deep Tendon Reflexes: 2+ and symmetric in the biceps and patella Cerebellar: normal finger-to-nose with bilateral upper extremities Gait: normal gait and balance CV: distal pulses palpable throughout    Skin: Skin is warm.  Psychiatric: She has a normal mood and affect. Her behavior is normal.  Nursing note and vitals reviewed.    ED Treatments / Results  Labs (all labs ordered are listed, but only abnormal results are displayed) Labs Reviewed  PREGNANCY, URINE    EKG None  Radiology Ct Head Wo Contrast  Result Date: 05/14/2018 CLINICAL DATA:  Worst headache of life x4 days. EXAM: CT HEAD WITHOUT CONTRAST TECHNIQUE: Contiguous axial images were obtained from the base of the skull through the vertex without intravenous contrast. COMPARISON:  MRI 04/17/2018 and CT 05/09/2016 FINDINGS: BRAIN: The ventricles and sulci are normal. No intraparenchymal hemorrhage, mass effect nor midline shift. No acute large vascular territory infarcts. Grey-white matter distinction is maintained. The basal ganglia are unremarkable. No abnormal extra-axial fluid collections. Basal cisterns are not effaced and midline. The brainstem and cerebellar hemispheres are without acute abnormalities. Tiny remote left cerebellar infarcts are again noted. VASCULAR: No hyperdense vessel sign or unexpected calcifications. SKULL/SOFT TISSUES: No skull fracture. No significant soft tissue swelling. ORBITS/SINUSES: The included ocular globes and orbital contents are normal.The mastoid air cells are clear. The included paranasal sinuses are  well-aerated. OTHER: None. IMPRESSION: No acute intracranial abnormality. Tiny remote left cerebellar infarcts without significant change better visualized on prior MRI. Electronically Signed   By: Tollie Eth M.D.   On: 05/14/2018 19:52    Procedures Procedures (including critical care time)  Medications Ordered in ED Medications  ketorolac (TORADOL) 30 MG/ML injection 30 mg (has no administration in time range)  sodium chloride 0.9 % bolus 1,000 mL (1,000 mLs Intravenous New Bag/Given 05/14/18 1828)  metoCLOPramide (REGLAN) injection 10 mg (10 mg Intravenous Given 05/14/18 1832)  diphenhydrAMINE (BENADRYL) injection 25 mg (25 mg Intravenous Given 05/14/18 1832)     Initial Impression / Assessment and Plan / ED Course  I have reviewed the triage vital signs and the nursing notes.  Pertinent labs & imaging results that were available during my care of the patient were reviewed by me and considered in my medical decision making (see chart for details).     She with history of stroke and seizure disorder, presenting to the emergency department with left-sided headache since "Sunday.  Headache described as throbbing with associated photophobia, phonophobia, nausea.  Patient states she does not have history of headaches and does relate this as the worst headache of her life.  She has a normal neurologic exam.  Treated with migraine cocktail and CT scan obtained to rule out acute pathology.  Patient's pain nearly resolved after migraine cocktail.  CT scan is negative for acute pathology. Presentation is not concerning for temporal arteritis or meningitis. No neuro deficits, changes in vision, or nuchal rigidity.  Provided reassurance.  Patient is well-appearing and agreeable to discharge with PCP follow-up. Strict return precautions discussed.  Discussed results, findings, treatment and follow up. Patient advised of return precautions. Patient verbalized understanding and agreed with  plan.   Final Clinical Impressions(s) / ED Diagnoses   Final diagnoses:  Bad headache    ED Discharge Orders         Ordered    metoCLOPramide (REGLAN) 10 MG tablet  Every 6 hours PRN     11" /20/19 2002           Susana Gripp, [place] N, PA-C 05/14/18 2032    Monzerrath Mcburney, [place] N, PA-C 05/14/18 2033    Geoffery Lyons, MD 05/14/18 2337

## 2018-05-19 DIAGNOSIS — E785 Hyperlipidemia, unspecified: Secondary | ICD-10-CM | POA: Diagnosis not present

## 2018-05-28 DIAGNOSIS — J0100 Acute maxillary sinusitis, unspecified: Secondary | ICD-10-CM | POA: Diagnosis not present

## 2018-05-28 DIAGNOSIS — Z8669 Personal history of other diseases of the nervous system and sense organs: Secondary | ICD-10-CM | POA: Diagnosis not present

## 2018-07-08 DIAGNOSIS — M545 Low back pain, unspecified: Secondary | ICD-10-CM | POA: Insufficient documentation

## 2018-07-17 DIAGNOSIS — M5416 Radiculopathy, lumbar region: Secondary | ICD-10-CM | POA: Insufficient documentation

## 2018-08-24 HISTORY — PX: BACK SURGERY: SHX140

## 2018-08-26 DIAGNOSIS — I639 Cerebral infarction, unspecified: Secondary | ICD-10-CM | POA: Diagnosis not present

## 2018-09-02 ENCOUNTER — Ambulatory Visit: Payer: 59 | Admitting: Neurology

## 2018-09-02 ENCOUNTER — Telehealth: Payer: Self-pay

## 2018-09-02 NOTE — Telephone Encounter (Signed)
Patient no show for appt today. 

## 2018-09-03 ENCOUNTER — Encounter: Payer: Self-pay | Admitting: Neurology

## 2019-01-09 ENCOUNTER — Other Ambulatory Visit: Payer: Self-pay | Admitting: Internal Medicine

## 2019-01-09 DIAGNOSIS — Z20822 Contact with and (suspected) exposure to covid-19: Secondary | ICD-10-CM

## 2019-01-14 LAB — NOVEL CORONAVIRUS, NAA: SARS-CoV-2, NAA: NOT DETECTED

## 2019-03-30 ENCOUNTER — Other Ambulatory Visit: Payer: Self-pay

## 2019-03-30 DIAGNOSIS — Z20822 Contact with and (suspected) exposure to covid-19: Secondary | ICD-10-CM

## 2019-04-01 LAB — NOVEL CORONAVIRUS, NAA: SARS-CoV-2, NAA: NOT DETECTED

## 2019-05-05 ENCOUNTER — Other Ambulatory Visit: Payer: Self-pay

## 2019-05-05 DIAGNOSIS — Z20822 Contact with and (suspected) exposure to covid-19: Secondary | ICD-10-CM

## 2019-05-07 LAB — NOVEL CORONAVIRUS, NAA: SARS-CoV-2, NAA: NOT DETECTED

## 2019-06-16 ENCOUNTER — Ambulatory Visit: Payer: 59 | Attending: Internal Medicine

## 2019-06-16 DIAGNOSIS — Z20822 Contact with and (suspected) exposure to covid-19: Secondary | ICD-10-CM

## 2019-06-19 LAB — NOVEL CORONAVIRUS, NAA: SARS-CoV-2, NAA: NOT DETECTED

## 2019-06-29 ENCOUNTER — Ambulatory Visit: Payer: 59 | Attending: Internal Medicine

## 2019-06-29 DIAGNOSIS — Z20822 Contact with and (suspected) exposure to covid-19: Secondary | ICD-10-CM

## 2019-06-30 LAB — NOVEL CORONAVIRUS, NAA: SARS-CoV-2, NAA: NOT DETECTED

## 2019-07-03 ENCOUNTER — Ambulatory Visit: Payer: 59 | Attending: Internal Medicine

## 2019-07-03 DIAGNOSIS — Z20822 Contact with and (suspected) exposure to covid-19: Secondary | ICD-10-CM

## 2019-07-05 LAB — NOVEL CORONAVIRUS, NAA: SARS-CoV-2, NAA: NOT DETECTED

## 2019-07-27 ENCOUNTER — Other Ambulatory Visit: Payer: 59

## 2019-07-28 ENCOUNTER — Ambulatory Visit: Payer: 59 | Attending: Internal Medicine

## 2019-07-28 DIAGNOSIS — Z20822 Contact with and (suspected) exposure to covid-19: Secondary | ICD-10-CM

## 2019-07-29 LAB — NOVEL CORONAVIRUS, NAA: SARS-CoV-2, NAA: NOT DETECTED

## 2019-08-04 ENCOUNTER — Emergency Department (HOSPITAL_BASED_OUTPATIENT_CLINIC_OR_DEPARTMENT_OTHER): Payer: 59

## 2019-08-04 ENCOUNTER — Emergency Department (HOSPITAL_BASED_OUTPATIENT_CLINIC_OR_DEPARTMENT_OTHER)
Admission: EM | Admit: 2019-08-04 | Discharge: 2019-08-04 | Disposition: A | Discharge status: Home / Self Care | Payer: 59 | Attending: Emergency Medicine | Admitting: Emergency Medicine

## 2019-08-04 ENCOUNTER — Other Ambulatory Visit: Payer: Self-pay

## 2019-08-04 ENCOUNTER — Encounter (HOSPITAL_BASED_OUTPATIENT_CLINIC_OR_DEPARTMENT_OTHER): Payer: Self-pay | Admitting: *Deleted

## 2019-08-04 DIAGNOSIS — Z882 Allergy status to sulfonamides status: Secondary | ICD-10-CM | POA: Insufficient documentation

## 2019-08-04 DIAGNOSIS — Z880 Allergy status to penicillin: Secondary | ICD-10-CM | POA: Diagnosis not present

## 2019-08-04 DIAGNOSIS — Z7982 Long term (current) use of aspirin: Secondary | ICD-10-CM | POA: Insufficient documentation

## 2019-08-04 DIAGNOSIS — Z79899 Other long term (current) drug therapy: Secondary | ICD-10-CM | POA: Insufficient documentation

## 2019-08-04 DIAGNOSIS — Z87891 Personal history of nicotine dependence: Secondary | ICD-10-CM | POA: Insufficient documentation

## 2019-08-04 DIAGNOSIS — E785 Hyperlipidemia, unspecified: Secondary | ICD-10-CM | POA: Insufficient documentation

## 2019-08-04 DIAGNOSIS — Z881 Allergy status to other antibiotic agents status: Secondary | ICD-10-CM | POA: Diagnosis not present

## 2019-08-04 DIAGNOSIS — G40909 Epilepsy, unspecified, not intractable, without status epilepticus: Secondary | ICD-10-CM | POA: Diagnosis not present

## 2019-08-04 DIAGNOSIS — R42 Dizziness and giddiness: Secondary | ICD-10-CM | POA: Insufficient documentation

## 2019-08-04 DIAGNOSIS — Z8673 Personal history of transient ischemic attack (TIA), and cerebral infarction without residual deficits: Secondary | ICD-10-CM | POA: Diagnosis not present

## 2019-08-04 LAB — BASIC METABOLIC PANEL
Anion gap: 8 (ref 5–15)
BUN: 10 mg/dL (ref 6–20)
CO2: 25 mmol/L (ref 22–32)
Calcium: 9.7 mg/dL (ref 8.9–10.3)
Chloride: 105 mmol/L (ref 98–111)
Creatinine, Ser: 0.72 mg/dL (ref 0.44–1.00)
GFR calc Af Amer: >60 mL/min (ref >60)
GFR calc non Af Amer: >60 mL/min (ref >60)
Glucose, Bld: 143 mg/dL — ABNORMAL HIGH (ref 70–99)
Potassium: 3.6 mmol/L (ref 3.5–5.1)
Sodium: 138 mmol/L (ref 135–145)

## 2019-08-04 LAB — CBC WITH DIFFERENTIAL/PLATELET
Abs Immature Granulocytes: 0.06 10*3/uL (ref 0.00–0.07)
Basophils Absolute: 0 10*3/uL (ref 0.0–0.1)
Basophils Relative: 0 %
Eosinophils Absolute: 0.1 10*3/uL (ref 0.0–0.5)
Eosinophils Relative: 1 %
HCT: 41.5 % (ref 36.0–46.0)
Hemoglobin: 13 g/dL (ref 12.0–15.0)
Immature Granulocytes: 1 %
Lymphocytes Relative: 21 %
Lymphs Abs: 1.9 10*3/uL (ref 0.7–4.0)
MCH: 21 pg — ABNORMAL LOW (ref 26.0–34.0)
MCHC: 31.3 g/dL (ref 30.0–36.0)
MCV: 66.9 fL — ABNORMAL LOW (ref 80.0–100.0)
Monocytes Absolute: 0.4 10*3/uL (ref 0.1–1.0)
Monocytes Relative: 5 %
Neutro Abs: 6.5 10*3/uL (ref 1.7–7.7)
Neutrophils Relative %: 72 %
Platelets: 243 10*3/uL (ref 150–400)
RBC: 6.2 MIL/uL — ABNORMAL HIGH (ref 3.87–5.11)
RDW: 16.5 % — ABNORMAL HIGH (ref 11.5–15.5)
WBC: 9 10*3/uL (ref 4.0–10.5)
nRBC: 0 % (ref 0.0–0.2)

## 2019-08-04 LAB — HCG, SERUM, QUALITATIVE: Preg, Serum: NEGATIVE

## 2019-08-04 MED ORDER — IOHEXOL 350 MG/ML SOLN
100.0000 mL | Freq: Once | INTRAVENOUS | Status: AC | PRN
  Administered 2019-08-04: 12:00:00 100 mL via INTRAVENOUS

## 2019-08-04 MED ORDER — MECLIZINE HCL 25 MG PO TABS
25.0000 mg | ORAL_TABLET | Freq: Once | ORAL | Status: AC
  Administered 2019-08-04: 25 mg via ORAL
  Filled 2019-08-04: qty 1

## 2019-08-04 MED ORDER — MECLIZINE HCL 25 MG PO TABS: 25.0000 mg | ORAL_TABLET | Freq: Three times a day (TID) | ORAL | 0 refills | Status: DC | PRN

## 2019-08-04 MED FILL — MECLIZINE 12.5 MG CAPLET: 12.5 | 17 days supply | Qty: 100 | Fill #0

## 2019-08-04 NOTE — ED Triage Notes (Signed)
Vertigo double vision. Hx of vertebral dissection in 2017.

## 2019-08-04 NOTE — ED Provider Notes (Signed)
[facility] EMERGENCY DEPARTMENT Provider Note   CSN: [id number] Arrival date & time: 08/04/19  1105     History Chief Complaint  Patient presents with  . Dizziness    Marilyn Black is a 39 y.o. female.  Presents to ER with dizziness.  Reports past medical history stroke, artery dissection, on baby aspirin.  Last night noted episode of feeling dizzy.  Seem to get better, went to bed, throughout the day today has seemed to get worse.  I dizziness seems to come and go and episodes, triggered by sudden movements, improved with rest.  No ear pain, no facial redness or swelling, no associated numbness, weakness, vision changes.  States she has had prior episodes of vertigo since having her stroke that were thought to be due to other causes.  Patient took 325 aspirin this morning.  Chart review, reviewed discharge summary from stroke admission.  Vertebral artery dissection.  HPI     Past Medical History:  Diagnosis Date  . Gestational diabetes    diet controlled  . Gestational diabetes mellitus (GDM), antepartum   . Nocturnal epilepsy (HCC)   . Postpartum care following vaginal delivery 01/09/2011  . Seizure disorder (HCC) 01/09/2011  . Seizures (HCC)   . Stroke (HCC)   . Thalassemia   . Thalassemia minor   . Thalassemia trait     Patient Active Problem List   Diagnosis Date Noted  . Seizures (HCC) 01/17/2016  . Stroke (cerebrum) (HCC)   . Postpartum hemorrhage of vagina 11/14/2015  . Hyperlipidemia with target LDL less than 70 11/14/2015  . 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 Neck pain 11/13/2015  . 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 SVD (spontaneous vaginal delivery) 10/06/2015  . Postpartum care following vaginal delivery (4/13) 10/06/2015  . Postpartum state 10/06/2015  . Labor and delivery, indication for care 10/05/2015  . Seizure disorder (HCC) 01/09/2011    Past Surgical History:  Procedure Laterality  Date  . WISDOM TOOTH EXTRACTION  2002     OB History    Gravida  3   Para  2   Term  2   Preterm      AB  1   Living  2     SAB  1   TAB      Ectopic      Multiple  0   Live Births  2           Family History  Problem Relation Age of Onset  . Cancer Father   . Diabetes Mother   . Hypertension Mother   . Diabetes Sister   . Mental illness Sister        bipolar  . Stroke Sister   . Cancer Maternal Grandmother   . Diabetes Maternal Grandmother   . Hypertension Maternal Grandmother   . Cancer Maternal Grandfather     Social History   Tobacco Use  . Smoking status: Former Smoker    Quit date: 07/09/2005    Years since quitting: 14.0  . Smokeless tobacco: Never Used  Substance Use Topics  . Alcohol use: No  . Drug use: No    Home Medications Prior to Admission medications   Medication Sig Start Date End Date Taking? Authorizing Provider  aspirin EC 325 MG tablet Take 1 tablet (325 mg total) by mouth daily. 11/14/15  Yes Dhungel, Nishant, MD  Oxcarbazepine (TRILEPTAL) 300 MG tablet Take 300 mg by mouth  2 (two) times daily.    Yes [provider]  Cholecalciferol (VITAMIN D) 2000 units tablet Take 4,000 Units by mouth at bedtime.    [provider]  diazepam (VALIUM) 5 MG tablet Take 1 tablet (5 mg total) by mouth every 8 (eight) hours as needed for anxiety. If the meclizine is not helping you can take this 04/18/18   Charlestine Night, PA-C  Fenugreek 500 MG CAPS Take 500 mg by mouth 3 (three) times daily.    [provider]  folic acid (FOLVITE) 1 MG tablet 4 mg.  12/19/15   [provider]  meclizine (ANTIVERT) 25 MG tablet Take 1 tablet (25 mg total) by mouth 3 (three) times daily as needed for dizziness or nausea. 08/04/19   Milagros Loll, MD  metoCLOPramide (REGLAN) 10 MG tablet Take 1 tablet (10 mg total) by mouth every 6 (six) hours as needed for nausea (headache). 05/14/18   Robinson, [place] N, PA-C    Prenatal Multivit-Min-Fe-FA (PRENATAL VITAMINS PO) Take 1 tablet by mouth at bedtime.     [provider]    Allergies    Codeine, Vancomycin, Penicillins, Sulfa antibiotics, and Erythromycin  Review of Systems   Review of Systems  Constitutional: Negative for chills and fever.  HENT: Negative for ear pain and sore throat.   Eyes: Negative for pain and visual disturbance.  Respiratory: Negative for cough and shortness of breath.   Cardiovascular: Negative for chest pain and palpitations.  Gastrointestinal: Negative for abdominal pain and vomiting.  Genitourinary: Negative for dysuria and hematuria.  Musculoskeletal: Negative for arthralgias and back pain.  Skin: Negative for color change and rash.  Neurological: Negative for seizures and syncope.  All other systems reviewed and are negative.   Physical Exam Updated Vital Signs BP 111/78 (BP Location: Right Arm)   Pulse 86   Temp 98.3 F (36.8 C) (Oral)   Resp 16   Ht 5\' 5"  (1.651 m)   Wt 106.6 kg   LMP 07/20/2019   SpO2 100%   BMI 39.11 kg/m   Physical Exam Vitals and nursing note reviewed.  Constitutional:      General: She is not in acute distress.    Appearance: She is well-developed.  HENT:     Head: Normocephalic and atraumatic.  Eyes:     Conjunctiva/sclera: Conjunctivae normal.  Cardiovascular:     Rate and Rhythm: Normal rate and regular rhythm.     Heart sounds: No murmur.  Pulmonary:     Effort: Pulmonary effort is normal. No respiratory distress.     Breath sounds: Normal breath sounds.  Abdominal:     Palpations: Abdomen is soft.     Tenderness: There is no abdominal tenderness.  Musculoskeletal:     Cervical back: Neck supple.  Skin:    General: Skin is warm and dry.  Neurological:     Mental Status: She is alert.     Comments: Alert, oriented x3, cranial nerves II through XII intact, normal visual fields, normal finger-nose-finger, 5 out of 5 strength in bilateral upper and lower  extremities, sensation intact to light touch  HINTS: reassuring catch up saccade on head impulse, no vertical nystagmus, there is mild fatigable horizontal nystagmus, negative test of skew       ED Results / Procedures / Treatments   Labs (all labs ordered are listed, but only abnormal results are displayed) Labs Reviewed  CBC WITH DIFFERENTIAL/PLATELET - Abnormal; Notable for the following components:  Result Value   RBC 6.20 (*)    MCV 66.9 (*)    MCH 21.0 (*)    RDW 16.5 (*)    All other components within normal limits  BASIC METABOLIC PANEL - Abnormal; Notable for the following components:   Glucose, Bld 143 (*)    All other components within normal limits  HCG, SERUM, QUALITATIVE    EKG None  Radiology CT Angio Head W or Wo Contrast  Result Date: 08/04/2019 CLINICAL DATA:  Vertigo, central onset. Evaluate for dissection. EXAM: CT ANGIOGRAPHY HEAD AND NECK TECHNIQUE: Multidetector CT imaging of the head and neck was performed using the standard protocol during bolus administration of intravenous contrast. Multiplanar CT image reconstructions and MIPs were obtained to evaluate the vascular anatomy. Carotid stenosis measurements (when applicable) are obtained utilizing NASCET criteria, using the distal internal carotid diameter as the denominator. CONTRAST:  160mL OMNIPAQUE IOHEXOL 350 MG/ML SOLN COMPARISON:  CT of the neck May 09, 2016. FINDINGS: CT HEAD FINDINGS Brain: No evidence of acute infarction, hemorrhage, hydrocephalus, extra-axial collection or mass lesion/mass effect. Vascular: No hyperdense vessel or unexpected calcification. Skull: Normal. Negative for fracture or focal lesion. Sinuses: Imaged portions are clear. Orbits: No acute finding. Review of the MIP images confirms the above findings CTA NECK FINDINGS Aortic arch: Origin of the aortic arch branches obscured by artifact related to the contrast in the veins. Right carotid system: No evidence of dissection,  stenosis (50% or greater) or occlusion. Left carotid system: No evidence of dissection, stenosis (50% or greater) or occlusion. Vertebral arteries: Codominant. No evidence of dissection, stenosis (50% or greater) or occlusion. Skeleton: Negative Other neck: Negative Upper chest: Unremarkable Review of the MIP images confirms the above findings CTA HEAD FINDINGS Anterior circulation: No significant stenosis, proximal occlusion, aneurysm, or vascular malformation. Posterior circulation: No significant stenosis, proximal occlusion, aneurysm, or vascular malformation. Venous sinuses: As permitted by contrast timing, patent. Anatomic variants: None Review of the MIP images confirms the above findings IMPRESSION: Normal exam. Electronically Signed   By: Pedro Earls M.D.   On: 08/04/2019 12:29   CT Angio Neck W and/or Wo Contrast  Result Date: 08/04/2019 CLINICAL DATA:  Vertigo, central onset. Evaluate for dissection. EXAM: CT ANGIOGRAPHY HEAD AND NECK TECHNIQUE: Multidetector CT imaging of the head and neck was performed using the standard protocol during bolus administration of intravenous contrast. Multiplanar CT image reconstructions and MIPs were obtained to evaluate the vascular anatomy. Carotid stenosis measurements (when applicable) are obtained utilizing NASCET criteria, using the distal internal carotid diameter as the denominator. CONTRAST:  157mL OMNIPAQUE IOHEXOL 350 MG/ML SOLN COMPARISON:  CT of the neck May 09, 2016. FINDINGS: CT HEAD FINDINGS Brain: No evidence of acute infarction, hemorrhage, hydrocephalus, extra-axial collection or mass lesion/mass effect. Vascular: No hyperdense vessel or unexpected calcification. Skull: Normal. Negative for fracture or focal lesion. Sinuses: Imaged portions are clear. Orbits: No acute finding. Review of the MIP images confirms the above findings CTA NECK FINDINGS Aortic arch: Origin of the aortic arch branches obscured by artifact related to  the contrast in the veins. Right carotid system: No evidence of dissection, stenosis (50% or greater) or occlusion. Left carotid system: No evidence of dissection, stenosis (50% or greater) or occlusion. Vertebral arteries: Codominant. No evidence of dissection, stenosis (50% or greater) or occlusion. Skeleton: Negative Other neck: Negative Upper chest: Unremarkable Review of the MIP images confirms the above findings CTA HEAD FINDINGS Anterior circulation: No significant stenosis, proximal occlusion, aneurysm, or vascular  malformation. Posterior circulation: No significant stenosis, proximal occlusion, aneurysm, or vascular malformation. Venous sinuses: As permitted by contrast timing, patent. Anatomic variants: None Review of the MIP images confirms the above findings IMPRESSION: Normal exam. Electronically Signed   By: Baldemar Lenis M.D.   On: 08/04/2019 12:29    Procedures Procedures (including critical care time)  Medications Ordered in ED Medications  meclizine (ANTIVERT) tablet 25 mg (25 mg Oral Given 08/04/19 1148)  iohexol (OMNIPAQUE) 350 MG/ML injection 100 mL (100 mLs Intravenous Contrast Given 08/04/19 1150)    ED Course  I have reviewed the triage vital signs and the nursing notes.  Pertinent labs & imaging results that were available during my care of the patient were reviewed by me and considered in my medical decision making (see chart for details).    MDM Rules/Calculators/A&P                      39 year old lady presented to ER with vertigo.  Past medical history notable for vertebral artery dissection.  Today she is well-appearing, normal neurologic exam, reassuring hints exam.  Given past history, checked CT angio head and neck to evaluate for recurrent dissection.  CT angio was negative for evidence of dissection or stroke.  Symptoms resolved after single dose meclizine.  Given history, description of vertigo today, suspect peripheral cause.  Suspect BPPV.   Recommend outpatient follow-up with primary doctor and outpatient neurologist.  Provided Rx for meclizine.    After the discussed management above, the patient was determined to be safe for discharge.  The patient was in agreement with this plan and all questions regarding their care were answered.  ED return precautions were discussed and the patient will return to the ED with any significant worsening of condition.   Final Clinical Impression(s) / ED Diagnoses Final diagnoses:  Vertigo    Rx / DC Orders ED Discharge Orders         Ordered    meclizine (ANTIVERT) 25 MG tablet  3 times daily PRN     08/04/19 1258           Milagros Loll, MD 08/05/19 [id number]

## 2019-08-04 NOTE — Discharge Instructions (Addendum)
Please schedule follow-up appointment with your neurologist as well as your primary care doctor if your neurologist is unable to see you within the next week.  Please take meclizine as needed for further dizziness.  If you feel your dizziness in general is getting worse, more constant, you develop any numbness, weakness, vision changes, return to ER for reassessment.

## 2019-08-04 NOTE — ED Notes (Signed)
Presents to ED w/ c/o dizziness, stated yesterday had vertigo and HA yesterday, today still has HA with nausea. Left lateral area. No photosensitivity, feels like has difficulty walking, when moving her head, she states dizziness is worse.

## 2019-08-10 ENCOUNTER — Ambulatory Visit: Payer: 59 | Admitting: Neurology

## 2019-08-10 ENCOUNTER — Other Ambulatory Visit: Payer: Self-pay

## 2019-08-10 ENCOUNTER — Encounter: Payer: Self-pay | Admitting: Neurology

## 2019-08-10 VITALS — BP 134/91 | HR 93 | Temp 97.5°F | Ht 65.0 in | Wt 242.0 lb

## 2019-08-10 DIAGNOSIS — H814 Vertigo of central origin: Secondary | ICD-10-CM

## 2019-08-10 DIAGNOSIS — R42 Dizziness and giddiness: Secondary | ICD-10-CM | POA: Diagnosis not present

## 2019-08-10 DIAGNOSIS — G40909 Epilepsy, unspecified, not intractable, without status epilepticus: Secondary | ICD-10-CM

## 2019-08-10 DIAGNOSIS — Z5181 Encounter for therapeutic drug level monitoring: Secondary | ICD-10-CM | POA: Diagnosis not present

## 2019-08-10 HISTORY — DX: Dizziness and giddiness: R42

## 2019-08-10 MED ORDER — ALPRAZOLAM 0.5 MG PO TABS: ORAL_TABLET | ORAL | 0 refills | Status: DC

## 2019-08-10 NOTE — Progress Notes (Signed)
Reason for visit: Vertigo, headache  Referring physician: [place]  Marilyn Black is a 39 y.o. female  History of present illness:  Ms. Janvier is a 39 year old right-handed white female with a history of seizures, she is on Trileptal, she has not had any seizures since 2011.  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  Her sister apparently has been diagnosed with Ehlers-Danlos syndrome, the patient herself has not had genetic testing.  The patient had an event of vertigo in 2019 as well, MRI of the brain done at that time did not show a stroke event.  The patient has done well until 03 August 2019.  The patient was at work at the time and she noted that she had been looking down at her desk for a time, then when she looked up she had transient vertigo.  The patient decided to go home, at home she noted that she had 10 to 20 seconds of vertigo when she would lie down or when she was sitting up.  She also got some vertigo when she flexed her head down to look at her telephone.  The patient also reported onset of left occipital headache and some left neck discomfort around this time.  This has persisted.  The patient does have some chronic issues with neck pain at times.  The patient has a history of migraine headaches associated with menstrual cycle, but her usual migraines are throughout the head, not just in the left occipital area, she has not really had vertigo with her headaches previously.  The patient reports no definite double vision, loss of vision, hearing changes, or numbness or weakness of the extremities.  She denies any slurred speech or episodes of syncope.  The patient has indicated that the episodes of dizziness continue but they are gradually improving.  She has not had vomiting with the vertigo.  With the original stroke in 2017, she had prominent vomiting.  The  patient denies issues controlling the bowels or the bladder.  She was seen through the emergency room on 04 August 2019, a CT angiogram of the head and neck was done and was unremarkable.  She was given meclizine to take if needed.  Past Medical History:  Diagnosis Date  . Gestational diabetes    diet controlled  . Gestational diabetes mellitus (GDM), antepartum   . Nocturnal epilepsy (HCC)   . Postpartum care following vaginal delivery 01/09/2011  . Seizure disorder (HCC) 01/09/2011  . Seizures (HCC)   . Stroke (HCC)   . Thalassemia   . Thalassemia minor   . Thalassemia trait     Past Surgical History:  Procedure Laterality Date  . WISDOM TOOTH EXTRACTION  2002    Family History  Problem Relation Age of Onset  . Cancer Father   . Diabetes Mother   . Hypertension Mother   . Diabetes Sister   . Mental illness Sister        bipolar  . Stroke Sister   . Cancer Maternal Grandmother   . Diabetes Maternal Grandmother   . Hypertension Maternal Grandmother   . Cancer Maternal Grandfather     Social history:  reports that she quit smoking about 14 years ago. She has never used smokeless tobacco. She reports that she does not drink alcohol or use drugs.  Medications:  Prior to Admission medications  Medication Sig Start Date End Date Taking? Authorizing Provider  ASPIRIN 81 PO 325 mg.    Yes [provider]  Cholecalciferol (VITAMIN D) 2000 units tablet Take 4,000 Units by mouth at bedtime.   Yes [provider]  meclizine (ANTIVERT) 25 MG tablet Take 1 tablet (25 mg total) by mouth 3 (three) times daily as needed for dizziness or nausea. 08/04/19  Yes Lucrezia Starch, MD  Oxcarbazepine (TRILEPTAL) 300 MG tablet Take 300 mg by mouth 2 (two) times daily.    Yes [provider]  rosuvastatin (CRESTOR) 5 MG tablet rosuvastatin 5 mg tablet   Yes [provider]  folic acid (FOLVITE) 1 MG tablet 4 mg.  12/19/15   [provider]        Allergies  Allergen Reactions  . Codeine Other (See Comments)    fever  . Vancomycin Anaphylaxis  . Penicillins Rash    Has patient had a PCN reaction causing immediate rash, facial/tongue/throat swelling, SOB or lightheadedness with hypotension: Yes Has patient had a PCN reaction causing severe rash involving mucus membranes or skin necrosis: No Has patient had a PCN reaction that required hospitalization No Has patient had a PCN reaction occurring within the last 10 years: No If all of the above answers are "NO", then may proceed with Cephalosporin use.  . Sulfa Antibiotics Rash  . Erythromycin Base   . Erythromycin Other (See Comments)    Infant allergic reaction    ROS:  Out of a complete 14 system review of symptoms, the patient complains only of the following symptoms, and all other reviewed systems are negative.  Vertigo Headache, neck pain Mild gait instability  Blood pressure (!) 134/91, pulse 93, temperature (!) 97.5 F (36.4 C), height 5\' 5"  (1.651 m), weight 242 lb (109.8 kg), last menstrual period 07/20/2019, currently breastfeeding.  Physical Exam  General: The patient is alert and cooperative at the time of the examination.  Eyes: Pupils are equal, round, and reactive to light.   Ears: Tympanic membranes are clear bilaterally  Neck: The neck is supple, no carotid bruits are noted.  Respiratory: The respiratory examination is clear.  Cardiovascular: The cardiovascular examination reveals a regular rate and rhythm, no obvious murmurs or rubs are noted.  Neuromuscular: Range of movement the cervical spine is relatively full.  Skin: Extremities are without significant edema.  Neurologic Exam  Mental status: The patient is alert and oriented x 3 at the time of the examination. The patient has apparent normal recent and remote memory, with an apparently normal attention span and concentration ability.  Cranial nerves: Facial symmetry is present. There is  good sensation of the face to pinprick and soft touch bilaterally. The strength of the facial muscles and the muscles to head turning and shoulder shrug are normal bilaterally. Speech is well enunciated, no aphasia or dysarthria is noted. Extraocular movements are full. Visual fields are full. The tongue is midline, and the patient has symmetric elevation of the soft palate. No obvious hearing deficits are noted.  Motor: The motor testing reveals 5 over 5 strength of all 4 extremities. Good symmetric motor tone is noted throughout.  Sensory: Sensory testing is intact to pinprick, soft touch, vibration sensation, and position sense on all 4 extremities. No evidence of extinction is noted.  Coordination: Cerebellar testing reveals good finger-nose-finger and heel-to-shin bilaterally. The Nyan-Barrany procedure does not elicit any vertigo, the patient does have some end gaze nystagmus bilaterally.  Gait and station: Gait is  normal. Tandem gait is normal. Romberg is negative. No drift is seen.  Reflexes: Deep tendon reflexes are symmetric and normal bilaterally. Toes are downgoing bilaterally.   CTA head and neck 08/04/19:  IMPRESSION: Normal exam.  * CT scan images were reviewed online. I agree with the written report.   Assessment/Plan:  1.  Vertigo, left occipital headache  2.  History of left cerebellar stroke, left vertebral artery dissection  The patient has a benign clinical examination, but she reports onset of vertigo associated with headache.  This likely therefore does not represent benign positional vertigo although she can bring on vertigo with head movement.  The patient does have a history of migraine headache and she is on Trileptal for seizures.  We will check a Trileptal level today to ensure that she is not toxic on the drug.  She will have MRI of the brain.  The patient reports that her symptoms are gradually improving.  If the MRI of the brain is unremarkable, no further  work-up will be undertaken.  Marlan Palau MD 08/10/2019 8:46 AM  [facility] [address] [place], [place] [address]  Phone [phone] Fax [phone]

## 2019-08-12 ENCOUNTER — Ambulatory Visit: Payer: 59 | Attending: Internal Medicine

## 2019-08-12 DIAGNOSIS — Z20822 Contact with and (suspected) exposure to covid-19: Secondary | ICD-10-CM

## 2019-08-12 LAB — 10-HYDROXYCARBAZEPINE: Oxcarbazepine SerPl-Mcnc: 11 ug/mL (ref 10–35)

## 2019-08-13 LAB — NOVEL CORONAVIRUS, NAA: SARS-CoV-2, NAA: NOT DETECTED

## 2019-08-17 ENCOUNTER — Telehealth: Payer: Self-pay | Admitting: Neurology

## 2019-08-17 NOTE — Telephone Encounter (Signed)
Pt lvm requesting a call to discuss scheduling her MRI please advise

## 2019-08-18 NOTE — Telephone Encounter (Signed)
no to the covid questions MR Brain w/wo contrast Dr. Anne Hahn [facility] Auth: NPR Via [facility] website. Patient is scheduled at [facility] for 08/26/19.

## 2019-08-26 ENCOUNTER — Other Ambulatory Visit: Payer: Self-pay

## 2019-08-26 ENCOUNTER — Ambulatory Visit: Payer: 59

## 2019-08-26 DIAGNOSIS — H814 Vertigo of central origin: Secondary | ICD-10-CM | POA: Diagnosis not present

## 2019-08-26 MED ORDER — GADOBENATE DIMEGLUMINE 529 MG/ML IV SOLN
20.0000 mL | Freq: Once | INTRAVENOUS | Status: AC | PRN
  Administered 2019-08-26: 20 mL via INTRAVENOUS

## 2019-08-27 ENCOUNTER — Telehealth: Payer: Self-pay | Admitting: Neurology

## 2019-08-27 NOTE — Telephone Encounter (Signed)
I called the patient.  MRI of the brain is unchanged from 2019, the patient likely had a migraine headache associated with vertigo.   MRI brain 08/27/19:  IMPRESSION: This MRI of the brain with and without contrast shows the following: 1.   Small remote infarction in the inferior left cerebellum, unchanged compared to the 2019 MRI 2.   The brain is otherwise normal. 3.   Internal auditory canals are normal. 4.   Normal enhancement pattern and no acute findings.

## 2019-09-02 ENCOUNTER — Ambulatory Visit: Payer: 59 | Attending: Internal Medicine

## 2019-09-02 DIAGNOSIS — Z20822 Contact with and (suspected) exposure to covid-19: Secondary | ICD-10-CM

## 2019-09-03 LAB — NOVEL CORONAVIRUS, NAA: SARS-CoV-2, NAA: NOT DETECTED

## 2019-10-14 ENCOUNTER — Ambulatory Visit: Payer: 59 | Attending: Internal Medicine

## 2019-10-14 DIAGNOSIS — Z20822 Contact with and (suspected) exposure to covid-19: Secondary | ICD-10-CM

## 2019-10-15 LAB — SARS-COV-2, NAA 2 DAY TAT

## 2019-10-15 LAB — NOVEL CORONAVIRUS, NAA: SARS-CoV-2, NAA: NOT DETECTED

## 2019-11-02 DIAGNOSIS — D563 Thalassemia minor: Secondary | ICD-10-CM | POA: Insufficient documentation

## 2019-11-02 DIAGNOSIS — G43909 Migraine, unspecified, not intractable, without status migrainosus: Secondary | ICD-10-CM | POA: Insufficient documentation

## 2019-11-02 DIAGNOSIS — F418 Other specified anxiety disorders: Secondary | ICD-10-CM | POA: Insufficient documentation

## 2019-11-02 DIAGNOSIS — I772 Rupture of artery: Secondary | ICD-10-CM | POA: Insufficient documentation

## 2019-11-21 ENCOUNTER — Emergency Department (HOSPITAL_BASED_OUTPATIENT_CLINIC_OR_DEPARTMENT_OTHER): Payer: 59

## 2019-11-21 ENCOUNTER — Emergency Department (HOSPITAL_BASED_OUTPATIENT_CLINIC_OR_DEPARTMENT_OTHER)
Admission: EM | Admit: 2019-11-21 | Discharge: 2019-11-21 | Disposition: A | Discharge status: Home / Self Care | Payer: 59 | Attending: Emergency Medicine | Admitting: Emergency Medicine

## 2019-11-21 ENCOUNTER — Other Ambulatory Visit: Payer: Self-pay

## 2019-11-21 ENCOUNTER — Encounter (HOSPITAL_BASED_OUTPATIENT_CLINIC_OR_DEPARTMENT_OTHER): Payer: Self-pay | Admitting: Emergency Medicine

## 2019-11-21 DIAGNOSIS — Z7982 Long term (current) use of aspirin: Secondary | ICD-10-CM | POA: Insufficient documentation

## 2019-11-21 DIAGNOSIS — R1084 Generalized abdominal pain: Secondary | ICD-10-CM | POA: Insufficient documentation

## 2019-11-21 DIAGNOSIS — Z87891 Personal history of nicotine dependence: Secondary | ICD-10-CM | POA: Diagnosis not present

## 2019-11-21 DIAGNOSIS — Z79899 Other long term (current) drug therapy: Secondary | ICD-10-CM | POA: Diagnosis not present

## 2019-11-21 DIAGNOSIS — R109 Unspecified abdominal pain: Secondary | ICD-10-CM | POA: Diagnosis present

## 2019-11-21 LAB — URINALYSIS, ROUTINE W REFLEX MICROSCOPIC
Bilirubin Urine: NEGATIVE
Glucose, UA: 250 mg/dL — AB
Hgb urine dipstick: NEGATIVE
Ketones, ur: NEGATIVE mg/dL
Leukocytes,Ua: NEGATIVE
Nitrite: NEGATIVE
Protein, ur: NEGATIVE mg/dL
Specific Gravity, Urine: 1.015 (ref 1.005–1.030)
pH: 5 (ref 5.0–8.0)

## 2019-11-21 LAB — CBC WITH DIFFERENTIAL/PLATELET
Abs Immature Granulocytes: 0.08 10*3/uL — ABNORMAL HIGH (ref 0.00–0.07)
Basophils Absolute: 0 10*3/uL (ref 0.0–0.1)
Basophils Relative: 0 %
Eosinophils Absolute: 0.1 10*3/uL (ref 0.0–0.5)
Eosinophils Relative: 1 %
HCT: 39.9 % (ref 36.0–46.0)
Hemoglobin: 12.4 g/dL (ref 12.0–15.0)
Immature Granulocytes: 1 %
Lymphocytes Relative: 19 %
Lymphs Abs: 2.2 10*3/uL (ref 0.7–4.0)
MCH: 20.5 pg — ABNORMAL LOW (ref 26.0–34.0)
MCHC: 31.1 g/dL (ref 30.0–36.0)
MCV: 66 fL — ABNORMAL LOW (ref 80.0–100.0)
Monocytes Absolute: 0.3 10*3/uL (ref 0.1–1.0)
Monocytes Relative: 3 %
Neutro Abs: 8.9 10*3/uL — ABNORMAL HIGH (ref 1.7–7.7)
Neutrophils Relative %: 76 %
Platelets: 277 10*3/uL (ref 150–400)
RBC: 6.05 MIL/uL — ABNORMAL HIGH (ref 3.87–5.11)
RDW: 15.2 % (ref 11.5–15.5)
WBC: 11.7 10*3/uL — ABNORMAL HIGH (ref 4.0–10.5)
nRBC: 0 % (ref 0.0–0.2)

## 2019-11-21 LAB — COMPREHENSIVE METABOLIC PANEL
ALT: 31 U/L (ref 0–44)
AST: 20 U/L (ref 15–41)
Albumin: 5.1 g/dL — ABNORMAL HIGH (ref 3.5–5.0)
Alkaline Phosphatase: 64 U/L (ref 38–126)
Anion gap: 10 (ref 5–15)
BUN: 12 mg/dL (ref 6–20)
CO2: 24 mmol/L (ref 22–32)
Calcium: 9.9 mg/dL (ref 8.9–10.3)
Chloride: 104 mmol/L (ref 98–111)
Creatinine, Ser: 0.62 mg/dL (ref 0.44–1.00)
GFR calc Af Amer: >60 mL/min (ref >60)
GFR calc non Af Amer: >60 mL/min (ref >60)
Glucose, Bld: 102 mg/dL — ABNORMAL HIGH (ref 70–99)
Potassium: 3.8 mmol/L (ref 3.5–5.1)
Sodium: 138 mmol/L (ref 135–145)
Total Bilirubin: 0.5 mg/dL (ref 0.3–1.2)
Total Protein: 8 g/dL (ref 6.5–8.1)

## 2019-11-21 LAB — PREGNANCY, URINE: Preg Test, Ur: NEGATIVE

## 2019-11-21 LAB — LIPASE, BLOOD: Lipase: 29 U/L (ref 11–51)

## 2019-11-21 MED ORDER — ONDANSETRON 4 MG PO TBDP: ORAL_TABLET | ORAL | 0 refills | Status: DC

## 2019-11-21 MED ORDER — IOHEXOL 300 MG/ML  SOLN
100.0000 mL | Freq: Once | INTRAMUSCULAR | Status: AC | PRN
  Administered 2019-11-21: 100 mL via INTRAVENOUS

## 2019-11-21 MED ORDER — SODIUM CHLORIDE 0.9 % IV BOLUS
500.0000 mL | Freq: Once | INTRAVENOUS | Status: AC
  Administered 2019-11-21: 500 mL via INTRAVENOUS

## 2019-11-21 MED ORDER — DICYCLOMINE HCL 20 MG PO TABS: 20.0000 mg | ORAL_TABLET | Freq: Two times a day (BID) | ORAL | 0 refills | Status: DC

## 2019-11-21 NOTE — ED Provider Notes (Signed)
[facility] EMERGENCY DEPARTMENT Provider Note   CSN: [id number] Arrival date & time: 11/21/19  1920     History Chief Complaint  Patient presents with  . Abdominal Pain    Marilyn Black is a 39 y.o. female.  Patient is a 39 year old female who presents with abdominal pain.  She has had a 5-day history of worsening pain in her abdomen.  She describes it as "intestinal" pain.  She says its fairly diffuse crampy type pain.  Seems to be more located now in her right lower abdomen.  She has had some associated nausea but no change in bowels.  No vomiting.  No fevers.  No urinary symptoms.  No unusual vaginal discharge.  No pelvic pain.  No bleeding.  She states she has been trying to do a bland probiotic diet but has not had improvement in symptoms.  She did however eat half a slice of pizza today and her symptoms got markedly worse.        Past Medical History:  Diagnosis Date  . Gestational diabetes    diet controlled  . Gestational diabetes mellitus (GDM), antepartum   . Nocturnal epilepsy (HCC)   . Postpartum care following vaginal delivery 01/09/2011  . Seizure disorder (HCC) 01/09/2011  . Seizures (HCC)   . Stroke (HCC)   . Thalassemia   . Thalassemia minor   . Thalassemia trait   . Vertigo 08/10/2019    Patient Active Problem List   Diagnosis Date Noted  . Vertigo 08/10/2019  . Seizures (HCC) 01/17/2016  . Stroke (cerebrum) (HCC)   . Postpartum hemorrhage of vagina 11/14/2015  . Hyperlipidemia with target LDL less than 70 11/14/2015  . 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 Neck pain 11/13/2015  . 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 SVD (spontaneous vaginal delivery) 10/06/2015  . Postpartum care following vaginal delivery (4/13) 10/06/2015  . Postpartum state 10/06/2015  . Labor and delivery, indication for care 10/05/2015  . Seizure disorder (HCC) 01/09/2011    Past Surgical History:  Procedure  Laterality Date  . WISDOM TOOTH EXTRACTION  2002     OB History    Gravida  3   Para  2   Term  2   Preterm      AB  1   Living  2     SAB  1   TAB      Ectopic      Multiple  0   Live Births  2           Family History  Problem Relation Age of Onset  . Cancer Father   . Diabetes Mother   . Hypertension Mother   . Diabetes Sister   . Mental illness Sister        bipolar  . Stroke Sister   . Cancer Maternal Grandmother   . Diabetes Maternal Grandmother   . Hypertension Maternal Grandmother   . Cancer Maternal Grandfather     Social History   Tobacco Use  . Smoking status: Former Smoker    Quit date: 07/09/2005    Years since quitting: 14.3  . Smokeless tobacco: Never Used  Substance Use Topics  . Alcohol use: No  . Drug use: No    Home Medications Prior to Admission medications   Medication Sig Start Date End Date Taking? Authorizing Provider  ALPRAZolam Prudy Feeler) 0.5 MG tablet Take 2 tablets approximately 45 minutes prior to the  MRI study, take a third tablet if needed. 08/10/19   York Spaniel, MD  ASPIRIN 81 PO 325 mg.     [provider]  Cholecalciferol (VITAMIN D) 2000 units tablet Take 4,000 Units by mouth at bedtime.    [provider]  dicyclomine (BENTYL) 20 MG tablet Take 1 tablet (20 mg total) by mouth 2 (two) times daily. 11/21/19   Rolan Bucco, MD  folic acid (FOLVITE) 1 MG tablet 4 mg.  12/19/15   [provider]  meclizine (ANTIVERT) 25 MG tablet Take 1 tablet (25 mg total) by mouth 3 (three) times daily as needed for dizziness or nausea. 08/04/19   Milagros Loll, MD  ondansetron (ZOFRAN ODT) 4 MG disintegrating tablet 4mg  ODT q4 hours prn nausea/vomit 11/21/19   11/23/19, MD  Oxcarbazepine (TRILEPTAL) 300 MG tablet Take 300 mg by mouth 2 (two) times daily.     [provider]  rosuvastatin (CRESTOR) 5 MG tablet rosuvastatin 5 mg tablet    [provider]    Allergies      Codeine, Vancomycin, Penicillins, Sulfa antibiotics, Erythromycin base, and Erythromycin  Review of Systems   Review of Systems  Constitutional: Negative for chills, diaphoresis, fatigue and fever.  HENT: Negative for congestion, rhinorrhea and sneezing.   Eyes: Negative.   Respiratory: Negative for cough, chest tightness and shortness of breath.   Cardiovascular: Negative for chest pain and leg swelling.  Gastrointestinal: Positive for abdominal pain and nausea. Negative for blood in stool, diarrhea and vomiting.  Genitourinary: Negative for difficulty urinating, flank pain, frequency and hematuria.  Musculoskeletal: Negative for arthralgias and back pain.  Skin: Negative for rash.  Neurological: Negative for dizziness, speech difficulty, weakness, numbness and headaches.    Physical Exam Updated Vital Signs BP (!) 135/99 (BP Location: Right Arm)   Pulse 100   Temp 98.4 F (36.9 C) (Oral)   Resp 20   Ht 5\' 5"  (1.651 m)   Wt 111.1 kg   LMP 11/11/2019 (Approximate)   SpO2 100%   BMI 40.77 kg/m   Physical Exam Constitutional:      Appearance: She is well-developed.  HENT:     Head: Normocephalic and atraumatic.  Eyes:     Pupils: Pupils are equal, round, and reactive to light.  Cardiovascular:     Rate and Rhythm: Normal rate and regular rhythm.     Heart sounds: Normal heart sounds.  Pulmonary:     Effort: Pulmonary effort is normal. No respiratory distress.     Breath sounds: Normal breath sounds. No wheezing or rales.  Chest:     Chest wall: No tenderness.  Abdominal:     General: Bowel sounds are normal.     Palpations: Abdomen is soft.     Tenderness: There is generalized abdominal tenderness. There is no guarding or rebound.  Musculoskeletal:        General: Normal range of motion.     Cervical back: Normal range of motion and neck supple.  Lymphadenopathy:     Cervical: No cervical adenopathy.  Skin:    General: Skin is warm and dry.     Findings: No  rash.  Neurological:     Mental Status: She is alert and oriented to person, place, and time.     ED Results / Procedures / Treatments   Labs (all labs ordered are listed, but only abnormal results are displayed) Labs Reviewed  COMPREHENSIVE METABOLIC PANEL - Abnormal; Notable for the following components:  Result Value   Glucose, Bld 102 (*)    Albumin 5.1 (*)    All other components within normal limits  CBC WITH DIFFERENTIAL/PLATELET - Abnormal; Notable for the following components:   WBC 11.7 (*)    RBC 6.05 (*)    MCV 66.0 (*)    MCH 20.5 (*)    Neutro Abs 8.9 (*)    Abs Immature Granulocytes 0.08 (*)    All other components within normal limits  URINALYSIS, ROUTINE W REFLEX MICROSCOPIC - Abnormal; Notable for the following components:   Color, Urine STRAW (*)    Glucose, UA 250 (*)    All other components within normal limits  LIPASE, BLOOD  PREGNANCY, URINE    EKG None  Radiology CT Abdomen Pelvis W Contrast  Result Date: 11/21/2019 CLINICAL DATA:  Right lower quadrant abdominal pain for 5 days EXAM: CT ABDOMEN AND PELVIS WITH CONTRAST TECHNIQUE: Multidetector CT imaging of the abdomen and pelvis was performed using the standard protocol following bolus administration of intravenous contrast. CONTRAST:  128mL OMNIPAQUE IOHEXOL 300 MG/ML  SOLN COMPARISON:  None. FINDINGS: Lower chest: The visualized heart size within normal limits. No pericardial fluid/thickening. No hiatal hernia. The visualized portions of the lungs are clear. Hepatobiliary: The liver is normal in density without focal abnormality.The main portal vein is patent. No evidence of calcified gallstones, gallbladder wall thickening or biliary dilatation. Pancreas: Unremarkable. No pancreatic ductal dilatation or surrounding inflammatory changes. Spleen: Normal in size without focal abnormality. Adrenals/Urinary Tract: Both adrenal glands appear normal. The kidneys and collecting system appear normal without  evidence of urinary tract calculus or hydronephrosis. Bladder is unremarkable. Stomach/Bowel: The stomach, small bowel, and colon are normal in appearance. No inflammatory changes, wall thickening, or obstructive findings.The appendix is normal. Vascular/Lymphatic: There are no enlarged mesenteric, retroperitoneal, or pelvic lymph nodes. No significant vascular findings are present. Reproductive: The uterus and adnexa are unremarkable. Within the left adnexa there is a 3 cm low-density lesion, likely left ovarian cyst. A small amount of free fluid in the cul-de-sac. Other: No evidence of abdominal wall mass or hernia. Musculoskeletal: No acute or significant osseous findings. IMPRESSION: Normal appearing appendix. No other acute intra-abdominal or pelvic pathology to explain the patient's symptoms. Probable 3 cm left ovarian cyst. Electronically Signed   By: Prudencio Pair M.D.   On: 11/21/2019 22:06    Procedures Procedures (including critical care time)  Medications Ordered in ED Medications  sodium chloride 0.9 % bolus 500 mL (0 mLs Intravenous Stopped 11/21/19 2158)  iohexol (OMNIPAQUE) 300 MG/ML solution 100 mL (100 mLs Intravenous Contrast Given 11/21/19 2147)    ED Course  I have reviewed the triage vital signs and the nursing notes.  Pertinent labs & imaging results that were available during my care of the patient were reviewed by me and considered in my medical decision making (see chart for details).    MDM Rules/Calculators/A&P                      Patient is a 39 year old female who presents with generalized abdominal tenderness.  There is no specific area of pain.  She describes it more as a cramping sensation.  She had a CT scan which shows no acute abnormalities.  Her labs are nonconcerning.  Her urine does not appear infected.  Her pregnancy test is negative.  There is no evidence of cholecystitis or appendicitis.  No evidence of obstruction.  She has no nausea or vomiting here  in  the ED.  No fevers.  She was discharged home in good condition.  She was encouraged to follow-up with her PCP on Tuesday if her symptoms are not improved.  Return precautions were given.  She was given a prescription for Zofran and Bentyl for symptomatic relief. Final Clinical Impression(s) / ED Diagnoses Final diagnoses:  Generalized abdominal pain    Rx / DC Orders ED Discharge Orders         Ordered    ondansetron (ZOFRAN ODT) 4 MG disintegrating tablet     11/21/19 2236    dicyclomine (BENTYL) 20 MG tablet  2 times daily     11/21/19 2236           Rolan Bucco, MD 11/21/19 2238

## 2019-11-21 NOTE — ED Triage Notes (Signed)
Pt c/o lower abdominal and RLQ pain x 5 days. Pt denies diarrhea, denies emesis, reports nausea. PT endorses zofran at 1700 today

## 2019-11-25 DIAGNOSIS — Z9189 Other specified personal risk factors, not elsewhere classified: Secondary | ICD-10-CM | POA: Insufficient documentation

## 2019-12-14 DIAGNOSIS — N809 Endometriosis, unspecified: Secondary | ICD-10-CM | POA: Insufficient documentation

## 2019-12-22 ENCOUNTER — Emergency Department (HOSPITAL_COMMUNITY): Payer: 59

## 2019-12-22 ENCOUNTER — Other Ambulatory Visit: Payer: Self-pay

## 2019-12-22 ENCOUNTER — Emergency Department (HOSPITAL_COMMUNITY)
Admission: EM | Admit: 2019-12-22 | Discharge: 2019-12-22 | Disposition: A | Discharge status: Home / Self Care | Payer: 59 | Attending: Emergency Medicine | Admitting: Emergency Medicine

## 2019-12-22 ENCOUNTER — Encounter (HOSPITAL_COMMUNITY): Payer: Self-pay

## 2019-12-22 DIAGNOSIS — F419 Anxiety disorder, unspecified: Secondary | ICD-10-CM | POA: Insufficient documentation

## 2019-12-22 DIAGNOSIS — Z7982 Long term (current) use of aspirin: Secondary | ICD-10-CM | POA: Diagnosis not present

## 2019-12-22 DIAGNOSIS — Z87891 Personal history of nicotine dependence: Secondary | ICD-10-CM | POA: Insufficient documentation

## 2019-12-22 DIAGNOSIS — R638 Other symptoms and signs concerning food and fluid intake: Secondary | ICD-10-CM | POA: Diagnosis not present

## 2019-12-22 DIAGNOSIS — M62838 Other muscle spasm: Secondary | ICD-10-CM

## 2019-12-22 DIAGNOSIS — R000 Tachycardia, unspecified: Secondary | ICD-10-CM | POA: Diagnosis not present

## 2019-12-22 DIAGNOSIS — R5383 Other fatigue: Secondary | ICD-10-CM | POA: Insufficient documentation

## 2019-12-22 DIAGNOSIS — M791 Myalgia, unspecified site: Secondary | ICD-10-CM

## 2019-12-22 DIAGNOSIS — M542 Cervicalgia: Secondary | ICD-10-CM | POA: Diagnosis present

## 2019-12-22 DIAGNOSIS — R410 Disorientation, unspecified: Secondary | ICD-10-CM | POA: Diagnosis not present

## 2019-12-22 DIAGNOSIS — R0789 Other chest pain: Secondary | ICD-10-CM | POA: Insufficient documentation

## 2019-12-22 LAB — COMPREHENSIVE METABOLIC PANEL
ALT: 29 U/L (ref 0–44)
AST: 23 U/L (ref 15–41)
Albumin: 4.7 g/dL (ref 3.5–5.0)
Alkaline Phosphatase: 63 U/L (ref 38–126)
Anion gap: 9 (ref 5–15)
BUN: 11 mg/dL (ref 6–20)
CO2: 27 mmol/L (ref 22–32)
Calcium: 9.7 mg/dL (ref 8.9–10.3)
Chloride: 104 mmol/L (ref 98–111)
Creatinine, Ser: 0.69 mg/dL (ref 0.44–1.00)
GFR calc Af Amer: >60 mL/min (ref >60)
GFR calc non Af Amer: >60 mL/min (ref >60)
Glucose, Bld: 125 mg/dL — ABNORMAL HIGH (ref 70–99)
Potassium: 4.3 mmol/L (ref 3.5–5.1)
Sodium: 140 mmol/L (ref 135–145)
Total Bilirubin: 0.6 mg/dL (ref 0.3–1.2)
Total Protein: 7.7 g/dL (ref 6.5–8.1)

## 2019-12-22 LAB — CBC WITH DIFFERENTIAL/PLATELET
Abs Immature Granulocytes: 0.04 10*3/uL (ref 0.00–0.07)
Basophils Absolute: 0 10*3/uL (ref 0.0–0.1)
Basophils Relative: 0 %
Eosinophils Absolute: 0.1 10*3/uL (ref 0.0–0.5)
Eosinophils Relative: 2 %
HCT: 39.5 % (ref 36.0–46.0)
Hemoglobin: 11.9 g/dL — ABNORMAL LOW (ref 12.0–15.0)
Immature Granulocytes: 1 %
Lymphocytes Relative: 22 %
Lymphs Abs: 1.7 10*3/uL (ref 0.7–4.0)
MCH: 20.4 pg — ABNORMAL LOW (ref 26.0–34.0)
MCHC: 30.1 g/dL (ref 30.0–36.0)
MCV: 67.9 fL — ABNORMAL LOW (ref 80.0–100.0)
Monocytes Absolute: 0.4 10*3/uL (ref 0.1–1.0)
Monocytes Relative: 5 %
Neutro Abs: 5.3 10*3/uL (ref 1.7–7.7)
Neutrophils Relative %: 70 %
Platelets: 207 10*3/uL (ref 150–400)
RBC: 5.82 MIL/uL — ABNORMAL HIGH (ref 3.87–5.11)
RDW: 15.1 % (ref 11.5–15.5)
WBC: 7.5 10*3/uL (ref 4.0–10.5)
nRBC: 0 % (ref 0.0–0.2)

## 2019-12-22 LAB — URINALYSIS, ROUTINE W REFLEX MICROSCOPIC
Bilirubin Urine: NEGATIVE
Glucose, UA: NEGATIVE mg/dL
Hgb urine dipstick: NEGATIVE
Ketones, ur: NEGATIVE mg/dL
Nitrite: NEGATIVE
Protein, ur: NEGATIVE mg/dL
Specific Gravity, Urine: 1.005 (ref 1.005–1.030)
pH: 8 (ref 5.0–8.0)

## 2019-12-22 LAB — CK: Total CK: 53 U/L (ref 38–234)

## 2019-12-22 LAB — I-STAT BETA HCG BLOOD, ED (MC, WL, AP ONLY): I-stat hCG, quantitative: <5 m[IU]/mL (ref <5)

## 2019-12-22 LAB — TROPONIN I (HIGH SENSITIVITY): Troponin I (High Sensitivity): <2 ng/L (ref <18)

## 2019-12-22 MED ORDER — SODIUM CHLORIDE 0.9 % IV BOLUS
1000.0000 mL | Freq: Once | INTRAVENOUS | Status: AC
  Administered 2019-12-22: 1000 mL via INTRAVENOUS

## 2019-12-22 MED ORDER — DIAZEPAM 5 MG PO TABS
5.0000 mg | ORAL_TABLET | Freq: Two times a day (BID) | ORAL | 0 refills | Status: DC | PRN
Start: 2019-12-22 — End: 2020-12-16

## 2019-12-22 MED ORDER — LORAZEPAM 1 MG PO TABS
1.0000 mg | ORAL_TABLET | Freq: Once | ORAL | Status: AC
  Administered 2019-12-22: 1 mg via ORAL
  Filled 2019-12-22: qty 1

## 2019-12-22 MED ORDER — ALUM & MAG HYDROXIDE-SIMETH 200-200-20 MG/5ML PO SUSP
30.0000 mL | Freq: Once | ORAL | Status: AC
  Administered 2019-12-22: 30 mL via ORAL
  Filled 2019-12-22: qty 30

## 2019-12-22 MED ORDER — SODIUM CHLORIDE (PF) 0.9 % IJ SOLN
INTRAMUSCULAR | Status: AC
  Filled 2019-12-22: qty 50

## 2019-12-22 MED ORDER — IOHEXOL 350 MG/ML SOLN
100.0000 mL | Freq: Once | INTRAVENOUS | Status: AC | PRN
  Administered 2019-12-22: 100 mL via INTRAVENOUS

## 2019-12-22 NOTE — Discharge Instructions (Signed)
Take the Valium as needed to help with your muscle spasms. It is important for you to follow-up with your primary care provider and GI doctor.  I have contacted your GI doctor about today's visit for potential follow-up sooner. Return to the ED if you start to experience worsening dizziness, chest pain, leg swelling, shortness of breath, injuries or falls, numbness in arms or legs.

## 2019-12-22 NOTE — ED Triage Notes (Signed)
Pt presents with c/o generalized body aches and fatigue since Sunday. Pt reports she feels that she is in a dream and reports some confusion. Pt says she has been on a liquid diet for about a month r/t some GI issues with the recent introduction of soft food. Pt unsure if she has some kind of deficiency related to the diet. Pt is alert and oriented, able to answer all questions, no neuro deficits noted. Pt is noted to be anxious, reports hx of anxiety.

## 2019-12-22 NOTE — ED Provider Notes (Signed)
[facility]-EMERGENCY DEPT Provider Note   CSN: [id number] Arrival date & time: 12/22/19  0901     History Chief Complaint  Patient presents with   Generalized Body Aches   Fatigue    Marilyn Black is a 39 y.o. female with a past medical history of vertebral artery dissection currently on baby aspirin and rosuvastatin presenting to the ED with a chief complaint of generalized body aches, fatigue, confusion, dizziness and left-sided neck pain, chest pain.  States that for the past 3 weeks she has been on a clear liquid/soft foods diet due to "a GI infection."  She was given Cipro 3 weeks ago.  States that her symptoms have improved but she was told to slowly advance her diet as tolerated.  She reports generalized body aches for the past 2 days, intermittent confusion since she woke up this morning.  She reports intermittent dizziness for the past several days.  States that she will have "sharp" left-sided neck pain "from time to time, the doctors told me it was because of some muscle there."  Started having left-sided lower chest pain since yesterday without specific aggravating or alleviating factor.  Denies any cough, vomiting, urinary symptoms, fever or possibility of pregnancy. She states that her symptoms do not feel similar to when she presented with her vertebral artery dissection. Patient not currently breastfeeding.  HPI     Past Medical History:  Diagnosis Date   Gestational diabetes    diet controlled   Gestational diabetes mellitus (GDM), antepartum    Nocturnal epilepsy (HCC)    Postpartum care following vaginal delivery 01/09/2011   Seizure disorder (HCC) 01/09/2011   Seizures (HCC)    Stroke (HCC)    Thalassemia    Thalassemia minor    Thalassemia trait    Vertigo 08/10/2019    Patient Active Problem List   Diagnosis Date Noted   Vertigo 08/10/2019   Seizures (HCC) 01/17/2016   Stroke (cerebrum) (HCC)    Postpartum  hemorrhage of vagina 11/14/2015   Hyperlipidemia with target LDL less than 70 11/14/2015   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   Neck pain 11/13/2015   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)    SVD (spontaneous vaginal delivery) 10/06/2015   Postpartum care following vaginal delivery (4/13) 10/06/2015   Postpartum state 10/06/2015   Labor and delivery, indication for care 10/05/2015   Seizure disorder (HCC) 01/09/2011    Past Surgical History:  Procedure Laterality Date   WISDOM TOOTH EXTRACTION  2002     OB History    Gravida  3   Para  2   Term  2   Preterm      AB  1   Living  2     SAB  1   TAB      Ectopic      Multiple  0   Live Births  2           Family History  Problem Relation Age of Onset   Cancer Father    Diabetes Mother    Hypertension Mother    Diabetes Sister    Mental illness Sister        bipolar   Stroke Sister    Cancer Maternal Grandmother    Diabetes Maternal Grandmother    Hypertension Maternal Grandmother    Cancer Maternal Grandfather     Social History   Tobacco Use   Smoking status: Former Smoker  Quit date: 07/09/2005    Years since quitting: 14.4   Smokeless tobacco: Never Used  Substance Use Topics   Alcohol use: No   Drug use: No    Home Medications Prior to Admission medications   Medication Sig Start Date End Date Taking? Authorizing Provider  ASPIRIN 81 PO Take 81 mg by mouth daily.    Yes [provider]  Cholecalciferol (VITAMIN D) 2000 units tablet Take 4,000 Units by mouth at bedtime.   Yes [provider]  dicyclomine (BENTYL) 20 MG tablet Take 1 tablet (20 mg total) by mouth 2 (two) times daily. 11/21/19  Yes Rolan Bucco, MD  hyoscyamine (LEVSIN SL) 0.125 MG SL tablet Place 0.125 mg under the tongue daily as needed for pain. 12/14/19  Yes [provider]  INCASSIA 0.35 MG tablet Take 1 tablet by mouth at  bedtime. 12/14/19  Yes [provider]  lactobacillus acidophilus (BACID) TABS tablet Take 1 tablet by mouth daily.   Yes [provider]  Multiple Vitamin (MULTIVITAMIN) tablet Take 1 tablet by mouth daily.   Yes [provider]  omeprazole (PRILOSEC) 20 MG capsule Take 20 mg by mouth daily.   Yes [provider]  Oxcarbazepine (TRILEPTAL) 300 MG tablet Take 300 mg by mouth 2 (two) times daily.    Yes [provider]  rosuvastatin (CRESTOR) 5 MG tablet Take 5 mg by mouth daily.    Yes [provider]  diazepam (VALIUM) 5 MG tablet Take 1 tablet (5 mg total) by mouth every 12 (twelve) hours as needed for anxiety or muscle spasms. 12/22/19   Timo Hartwig, Hillary Bow, PA-C  folic acid (FOLVITE) 1 MG tablet 4 mg.  12/19/15   [provider]    Allergies    Codeine, Vancomycin, Penicillins, Sulfa antibiotics, Erythromycin base, Cefdinir, and Erythromycin  Review of Systems   Review of Systems  Constitutional: Positive for appetite change and fatigue. Negative for chills and fever.  HENT: Negative for ear pain, rhinorrhea, sneezing and sore throat.   Eyes: Negative for photophobia and visual disturbance.  Respiratory: Positive for chest tightness. Negative for cough, shortness of breath and wheezing.   Cardiovascular: Positive for chest pain. Negative for palpitations.  Gastrointestinal: Negative for abdominal pain, blood in stool, constipation, diarrhea, nausea and vomiting.  Genitourinary: Negative for dysuria, hematuria and urgency.  Musculoskeletal: Positive for myalgias and neck pain.  Skin: Negative for rash.  Neurological: Negative for dizziness, weakness and light-headedness.  Psychiatric/Behavioral: Positive for confusion.    Physical Exam Updated Vital Signs BP 121/70    Pulse 83    Temp 98.3 F (36.8 C) (Oral)    Resp 15    Ht 5\' 5"  (1.651 m)    Wt 106.6 kg    LMP 12/03/2019 (Approximate)    SpO2 100%    BMI 39.11 kg/m   Physical  Exam Vitals and nursing note reviewed.  Constitutional:      General: She is not in acute distress.    Appearance: She is well-developed.     Comments: Speaking in complete sentences without difficulty.  HENT:     Head: Normocephalic and atraumatic.     Nose: Nose normal.  Eyes:     General: No scleral icterus.       Right eye: No discharge.        Left eye: No discharge.     Conjunctiva/sclera: Conjunctivae normal.     Pupils: Pupils are equal, round, and reactive to light.  Cardiovascular:  Rate and Rhythm: Regular rhythm. Tachycardia present.     Heart sounds: Normal heart sounds. No murmur heard.  No friction rub. No gallop.   Pulmonary:     Effort: Pulmonary effort is normal. No respiratory distress.     Breath sounds: Normal breath sounds.  Chest:     Chest wall: Tenderness present.    Abdominal:     General: Bowel sounds are normal. There is no distension.     Palpations: Abdomen is soft.     Tenderness: There is no abdominal tenderness. There is no guarding.  Musculoskeletal:        General: Normal range of motion.     Cervical back: Normal range of motion and neck supple.     Comments: No lower extremity edema, erythema or calf tenderness bilaterally.  Skin:    General: Skin is warm and dry.     Findings: No rash.  Neurological:     General: No focal deficit present.     Mental Status: She is alert and oriented to person, place, and time.     Cranial Nerves: No cranial nerve deficit.     Sensory: No sensory deficit.     Motor: No weakness or abnormal muscle tone.     Coordination: Coordination normal.     Comments: Pupils reactive. No facial asymmetry noted. Cranial nerves appear grossly intact. Sensation intact to light touch on face, BUE and BLE. Strength 5/5 in BUE and BLE.  Psychiatric:        Mood and Affect: Mood is anxious. Affect is tearful.     ED Results / Procedures / Treatments   Labs (all labs ordered are listed, but only abnormal results  are displayed) Labs Reviewed  COMPREHENSIVE METABOLIC PANEL - Abnormal; Notable for the following components:      Result Value   Glucose, Bld 125 (*)    All other components within normal limits  CBC WITH DIFFERENTIAL/PLATELET - Abnormal; Notable for the following components:   RBC 5.82 (*)    Hemoglobin 11.9 (*)    MCV 67.9 (*)    MCH 20.4 (*)    All other components within normal limits  URINALYSIS, ROUTINE W REFLEX MICROSCOPIC - Abnormal; Notable for the following components:   Leukocytes,Ua LARGE (*)    Bacteria, UA MANY (*)    All other components within normal limits  CK  I-STAT BETA HCG BLOOD, ED ([facility], [facility], [facility] ONLY)  TROPONIN I (HIGH SENSITIVITY)    EKG EKG Interpretation  Date/Time:  Tuesday December 22 2019 10:51:57 EDT Ventricular Rate:  84 PR Interval:    QRS Duration: 88 QT Interval:  359 QTC Calculation: 425 R Axis:   51 Text Interpretation: Sinus rhythm No significant change since last tracing Confirmed by Linwood Dibbles [phone]) on 12/22/2019 11:01:43 AM   Radiology CT Angio Head W or Wo Contrast  Result Date: 12/22/2019 CLINICAL DATA:  39 year old female with left side neck pain since yesterday. History of 2017 left vertebral artery dissection. EXAM: CT ANGIOGRAPHY HEAD AND NECK TECHNIQUE: Multidetector CT imaging of the head and neck was performed using the standard protocol during bolus administration of intravenous contrast. Multiplanar CT image reconstructions and MIPs were obtained to evaluate the vascular anatomy. Carotid stenosis measurements (when applicable) are obtained utilizing NASCET criteria, using the distal internal carotid diameter as the denominator. CONTRAST:  OMNIPAQUE IOHEXOL 350 MG/ML SOLN COMPARISON:  Brain MRI 08/26/2019. CT head and neck 08/04/2019 and earlier. FINDINGS: CT HEAD Brain: Cerebral volume  remains normal. No midline shift, ventriculomegaly, mass effect, evidence of mass lesion, intracranial hemorrhage or evidence of cortically based  acute infarction. Gray-white matter differentiation is within normal limits throughout the brain. Calvarium and skull base: Negative. Paranasal sinuses: Visualized paranasal sinuses and mastoids are clear. Orbits: Visualized orbits and scalp soft tissues are within normal limits. CTA NECK Skeleton: No acute osseous abnormality identified. No significant cervical spine degeneration. Upper chest: Negative. Other neck: Negative. Aortic arch: Normal, 3 vessel arch configuration. Right carotid system: Mildly obscured right CCA origin due to dense right subclavian venous contrast and streak artifact. Otherwise normal right cervical carotids. Left carotid system: Normal. Vertebral arteries: Proximal subclavian arteries and vertebral artery origins are within normal limits. The right vertebral artery appears patent and normal to the skull base. The left vertebral artery is mildly dominant and appears patent and normal to the skull base, with chronically resolved vessel irregularity seen on the 11/13/2015 CTA. CTA HEAD Posterior circulation: Normal distal vertebral arteries, the left is mildly dominant. Normal PICA origins and vertebrobasilar junction. Normal basilar artery, SCA and PCA origins. Posterior communicating arteries are diminutive or absent. Bilateral PCA branches are within normal limits. Anterior circulation: Both ICA siphons are patent and normal. Normal ophthalmic artery origins. Patent carotid termini. Normal MCA and ACA origins. Normal anterior communicating artery and bilateral ACA branches. MCA M1 segments, MCA bifurcations, and bilateral MCA branches are stable and within normal limits. Venous sinuses: Patent. Anatomic variants: Mildly dominant left vertebral artery. Review of the MIP images confirms the above findings IMPRESSION: Normal CTA Head and Neck. Stable and normal CT appearance of the brain. Normalized left vertebral artery following the 2017 dissection. Electronically Signed   By: Odessa Fleming M.D.    On: 12/22/2019 12:43   DG Chest 2 View  Result Date: 12/22/2019 CLINICAL DATA:  Chest pain EXAM: CHEST - 2 VIEW COMPARISON:  None. FINDINGS: The heart size and mediastinal contours are within normal limits. Both lungs are clear. The visualized skeletal structures are unremarkable. IMPRESSION: No active cardiopulmonary disease. Electronically Signed   By: Alcide Clever M.D.   On: 12/22/2019 10:19   CT Angio Neck W and/or Wo Contrast  Result Date: 12/22/2019 CLINICAL DATA:  39 year old female with left side neck pain since yesterday. History of 2017 left vertebral artery dissection. EXAM: CT ANGIOGRAPHY HEAD AND NECK TECHNIQUE: Multidetector CT imaging of the head and neck was performed using the standard protocol during bolus administration of intravenous contrast. Multiplanar CT image reconstructions and MIPs were obtained to evaluate the vascular anatomy. Carotid stenosis measurements (when applicable) are obtained utilizing NASCET criteria, using the distal internal carotid diameter as the denominator. CONTRAST:  OMNIPAQUE IOHEXOL 350 MG/ML SOLN COMPARISON:  Brain MRI 08/26/2019. CT head and neck 08/04/2019 and earlier. FINDINGS: CT HEAD Brain: Cerebral volume remains normal. No midline shift, ventriculomegaly, mass effect, evidence of mass lesion, intracranial hemorrhage or evidence of cortically based acute infarction. Gray-white matter differentiation is within normal limits throughout the brain. Calvarium and skull base: Negative. Paranasal sinuses: Visualized paranasal sinuses and mastoids are clear. Orbits: Visualized orbits and scalp soft tissues are within normal limits. CTA NECK Skeleton: No acute osseous abnormality identified. No significant cervical spine degeneration. Upper chest: Negative. Other neck: Negative. Aortic arch: Normal, 3 vessel arch configuration. Right carotid system: Mildly obscured right CCA origin due to dense right subclavian venous contrast and streak artifact.  Otherwise normal right cervical carotids. Left carotid system: Normal. Vertebral arteries: Proximal subclavian arteries and vertebral artery origins  are within normal limits. The right vertebral artery appears patent and normal to the skull base. The left vertebral artery is mildly dominant and appears patent and normal to the skull base, with chronically resolved vessel irregularity seen on the 11/13/2015 CTA. CTA HEAD Posterior circulation: Normal distal vertebral arteries, the left is mildly dominant. Normal PICA origins and vertebrobasilar junction. Normal basilar artery, SCA and PCA origins. Posterior communicating arteries are diminutive or absent. Bilateral PCA branches are within normal limits. Anterior circulation: Both ICA siphons are patent and normal. Normal ophthalmic artery origins. Patent carotid termini. Normal MCA and ACA origins. Normal anterior communicating artery and bilateral ACA branches. MCA M1 segments, MCA bifurcations, and bilateral MCA branches are stable and within normal limits. Venous sinuses: Patent. Anatomic variants: Mildly dominant left vertebral artery. Review of the MIP images confirms the above findings IMPRESSION: Normal CTA Head and Neck. Stable and normal CT appearance of the brain. Normalized left vertebral artery following the 2017 dissection. Electronically Signed   By: Odessa Fleming M.D.   On: 12/22/2019 12:43    Procedures Procedures (including critical care time)  Medications Ordered in ED Medications  sodium chloride (PF) 0.9 % injection (has no administration in time range)  alum & mag hydroxide-simeth (MAALOX/MYLANTA) 200-200-20 MG/5ML suspension 30 mL (30 mLs Oral Given 12/22/19 0958)  LORazepam (ATIVAN) tablet 1 mg (1 mg Oral Given 12/22/19 0958)  sodium chloride 0.9 % bolus 1,000 mL (1,000 mLs Intravenous New Bag/Given 12/22/19 1159)  iohexol (OMNIPAQUE) 350 MG/ML injection 100 mL (100 mLs Intravenous Contrast Given 12/22/19 1129)    ED Course  I have  reviewed the triage vital signs and the nursing notes.  Pertinent labs & imaging results that were available during my care of the patient were reviewed by me and considered in my medical decision making (see chart for details).  Clinical Course as of Dec 21 1412  Tue Dec 22, 2019  1352 Leukocytes,Ua(!): LARGE [HK]  1352 Patient asymptomatic.  Bacteria, UA(!): MANY [HK]    Clinical Course User Index [HK] Dietrich Pates, PA-C   MDM Rules/Calculators/A&P                          39 year old female with past medical history of vertebral artery dissection currently on a baby aspirin daily presenting to the ED with a chief complaint of body aches, fatigue, confusion, dizziness, left-sided neck pain and chest pain.  She suffered from a "GI infection" about 3 weeks ago and was given ciprofloxacin and Flagyl.  Since then her diarrhea has improved but she was told to introduce a new soft food into her diet every 2 days instead of her usual diet.  States that she has been doing so but is concerned that she may be dehydrated.  She reports generalized body aches and cramping as well as muscle spasms.  Intermittent dizziness for the past several days as well as left-sided neck pain that she was told could be due to muscle spasms.  Chest pain began yesterday.  Exam initially patient extremely anxious, tachycardic which I feel like could be due to her anxiety.  She has no lower extremity edema which remains ambulatory, no evidence of Achilles tendon rupture.  No neurological deficits noted.  No facial asymmetry, weakness or numbness noted.  She was given IV fluids, Ativan and GI cocktail here.  Work-up here including EKG shows sinus rhythm, no changes from prior tracings.  CBC, CMP and CK are all within  normal limits.  Troponin is negative.  hCG is negative.  Urinalysis does show leukocytes and bacteria but patient asymptomatic and would not like to be treated.  Chest x-ray is unremarkable.  CT angio of the head and  neck was done due to her history of dissection in her symptoms today which were negative for acute abnormality.  Patient with some improvement in her "fogginess" and dizziness after medications here.  She did state that she read online that the Cipro could cause her to have an aortic aneurysm.  I reviewed her recent CT imaging which did not show any evidence of an aneurysm although this is not necessary with imaging of choice.  I explained this to the patient told her that she was low likelihood of having an aneurysm but we are not able to completely rule it out without a CT angio study.  States that "I do not want another CT scan."  However she continues to express frustration with her symptoms over the past few weeks.  She was told by her GI doctor that her symptoms are due to "recovery from what ever infection I had before."  She was told that this could take anywhere from 8 weeks to 8 months.  I understand her frustration and it must be different to change her diet from when it was normal a month ago.  However because she is under the care of a GI doctor I feel that it will be best for her to follow-up with them again.  I do not feel comfortable giving her any recommendations about her diet, but did reassure her that her lab work here did not show any electrolyte derangements, evidence of a stroke, rhabdo or AKI which could all cause her symptoms today.  Provide Valium as needed to help with muscle spasms.  Patient comfortable with following up with PCP and GI.  I have also sent a message to her GI doctor for follow-up.  All imaging, if done today, including plain films, CT scans, and ultrasounds, independently reviewed by me, and interpretations confirmed via formal radiology reads.  Patient is hemodynamically stable, in NAD, and able to ambulate in the ED. Evaluation does not show pathology that would require ongoing emergent intervention or inpatient treatment. I explained the diagnosis to the patient.  Pain has been managed and has no complaints prior to discharge. Patient is comfortable with above plan and is stable for discharge at this time. All questions were answered prior to disposition. Strict return precautions for returning to the ED were discussed. Encouraged follow up with PCP.   An After Visit Summary was printed and given to the patient.   Portions of this note were generated with Scientist, clinical (histocompatibility and immunogenetics)Dragon dictation software. Dictation errors may occur despite best attempts at proofreading.  Final Clinical Impression(s) / ED Diagnoses Final diagnoses:  Myalgia  Muscle spasm    Rx / DC Orders ED Discharge Orders         Ordered    diazepam (VALIUM) 5 MG tablet  Every 12 hours PRN     Discontinue  Reprint     12/22/19 [address]1413           Kayslee Furey, PA-C 12/22/19 1415    Linwood DibblesKnapp, Jon, MD 12/23/19 [phone]

## 2019-12-22 NOTE — ED Notes (Signed)
ED Provider at bedside. 

## 2019-12-31 DIAGNOSIS — Z8673 Personal history of transient ischemic attack (TIA), and cerebral infarction without residual deficits: Secondary | ICD-10-CM | POA: Insufficient documentation

## 2020-01-20 DIAGNOSIS — Z9889 Other specified postprocedural states: Secondary | ICD-10-CM | POA: Insufficient documentation

## 2020-01-21 DIAGNOSIS — K580 Irritable bowel syndrome with diarrhea: Secondary | ICD-10-CM | POA: Insufficient documentation

## 2020-02-25 DIAGNOSIS — G43019 Migraine without aura, intractable, without status migrainosus: Secondary | ICD-10-CM | POA: Insufficient documentation

## 2020-02-25 DIAGNOSIS — E559 Vitamin D deficiency, unspecified: Secondary | ICD-10-CM | POA: Insufficient documentation

## 2020-02-27 DIAGNOSIS — M791 Myalgia, unspecified site: Secondary | ICD-10-CM | POA: Insufficient documentation

## 2020-03-23 ENCOUNTER — Ambulatory Visit: Payer: 59 | Admitting: Family Medicine

## 2020-04-19 ENCOUNTER — Ambulatory Visit: Payer: 59 | Attending: Internal Medicine

## 2020-04-19 ENCOUNTER — Other Ambulatory Visit (HOSPITAL_BASED_OUTPATIENT_CLINIC_OR_DEPARTMENT_OTHER): Payer: Self-pay | Admitting: Internal Medicine

## 2020-04-19 DIAGNOSIS — Z23 Encounter for immunization: Secondary | ICD-10-CM

## 2020-04-19 NOTE — Progress Notes (Signed)
   Covid-19 Vaccination Clinic  Name:  Marilyn Black    MRN: [id number] DOB: [birth year]  04/19/2020  [name] was observed post Covid-19 immunization for 15 minutes without incident. She was provided with Vaccine Information Sheet and instruction to access the V-Safe system. Vaccinated by [employer].  [name] was instructed to call 911 with any severe reactions post vaccine: Marland Kitchen Difficulty breathing  . Swelling of face and throat  . A fast heartbeat  . A bad rash all over body  . Dizziness and weakness

## 2020-05-03 ENCOUNTER — Ambulatory Visit: Payer: 59

## 2020-06-09 DIAGNOSIS — D561 Beta thalassemia: Secondary | ICD-10-CM | POA: Insufficient documentation

## 2020-06-14 ENCOUNTER — Other Ambulatory Visit: Payer: Self-pay

## 2020-06-16 ENCOUNTER — Other Ambulatory Visit: Payer: 59

## 2020-06-16 DIAGNOSIS — Z20822 Contact with and (suspected) exposure to covid-19: Secondary | ICD-10-CM

## 2020-06-18 LAB — NOVEL CORONAVIRUS, NAA: SARS-CoV-2, NAA: NOT DETECTED

## 2020-06-18 LAB — SARS-COV-2, NAA 2 DAY TAT

## 2020-07-28 ENCOUNTER — Other Ambulatory Visit: Payer: 59

## 2020-07-28 DIAGNOSIS — Z20822 Contact with and (suspected) exposure to covid-19: Secondary | ICD-10-CM

## 2020-07-29 LAB — SARS-COV-2, NAA 2 DAY TAT

## 2020-07-29 LAB — NOVEL CORONAVIRUS, NAA: SARS-CoV-2, NAA: NOT DETECTED

## 2020-07-29 LAB — SPECIMEN STATUS REPORT

## 2020-10-14 ENCOUNTER — Encounter (HOSPITAL_BASED_OUTPATIENT_CLINIC_OR_DEPARTMENT_OTHER): Payer: Self-pay | Admitting: *Deleted

## 2020-10-14 ENCOUNTER — Emergency Department (HOSPITAL_BASED_OUTPATIENT_CLINIC_OR_DEPARTMENT_OTHER)
Admission: EM | Admit: 2020-10-14 | Discharge: 2020-10-14 | Disposition: A | Discharge status: Home / Self Care | Payer: 59 | Attending: Emergency Medicine | Admitting: Emergency Medicine

## 2020-10-14 ENCOUNTER — Other Ambulatory Visit: Payer: Self-pay

## 2020-10-14 DIAGNOSIS — Z87891 Personal history of nicotine dependence: Secondary | ICD-10-CM | POA: Insufficient documentation

## 2020-10-14 DIAGNOSIS — Z7982 Long term (current) use of aspirin: Secondary | ICD-10-CM | POA: Insufficient documentation

## 2020-10-14 DIAGNOSIS — Z23 Encounter for immunization: Secondary | ICD-10-CM | POA: Diagnosis not present

## 2020-10-14 DIAGNOSIS — S6991XA Unspecified injury of right wrist, hand and finger(s), initial encounter: Secondary | ICD-10-CM | POA: Diagnosis present

## 2020-10-14 DIAGNOSIS — W260XXA Contact with knife, initial encounter: Secondary | ICD-10-CM | POA: Insufficient documentation

## 2020-10-14 DIAGNOSIS — Y92000 Kitchen of unspecified non-institutional (private) residence as  the place of occurrence of the external cause: Secondary | ICD-10-CM | POA: Insufficient documentation

## 2020-10-14 DIAGNOSIS — S61210A Laceration without foreign body of right index finger without damage to nail, initial encounter: Secondary | ICD-10-CM | POA: Diagnosis not present

## 2020-10-14 MED ORDER — TETANUS-DIPHTH-ACELL PERTUSSIS 5-2.5-18.5 LF-MCG/0.5 IM SUSY
0.5000 mL | PREFILLED_SYRINGE | Freq: Once | INTRAMUSCULAR | Status: AC
  Administered 2020-10-14: 0.5 mL via INTRAMUSCULAR
  Filled 2020-10-14: qty 0.5

## 2020-10-14 MED ORDER — LIDOCAINE-EPINEPHRINE-TETRACAINE (LET) TOPICAL GEL
3.0000 mL | Freq: Once | TOPICAL | Status: AC
  Administered 2020-10-14: 3 mL via TOPICAL
  Filled 2020-10-14: qty 3

## 2020-10-14 NOTE — ED Provider Notes (Signed)
[facility] EMERGENCY DEPARTMENT Provider Note   CSN: [id number] Arrival date & time: 10/14/20  0015     History Chief Complaint  Patient presents with  . Laceration    Marilyn Black is a 40 y.o. female.  Sushi knife cut to right index finger just PTA (1.2 cm). contininued bleeding (on ASA) so came for eval. TDAP 06/2015. No neuro changes. No muscular changes. No injuries elsewhere.    Laceration      Past Medical History:  Diagnosis Date  . Gestational diabetes    diet controlled  . Gestational diabetes mellitus (GDM), antepartum   . Nocturnal epilepsy (HCC)   . Postpartum care following vaginal delivery 01/09/2011  . Seizure disorder (HCC) 01/09/2011  . Seizures (HCC)   . Stroke (HCC)   . Thalassemia   . Thalassemia minor   . Thalassemia trait   . Vertigo 08/10/2019    Patient Active Problem List   Diagnosis Date Noted  . Vertigo 08/10/2019  . Seizures (HCC) 01/17/2016  . Stroke (cerebrum) (HCC)   . Postpartum hemorrhage of vagina 11/14/2015  . Hyperlipidemia with target LDL less than 70 11/14/2015  . 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 Neck pain 11/13/2015  . 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 SVD (spontaneous vaginal delivery) 10/06/2015  . Postpartum care following vaginal delivery (4/13) 10/06/2015  . Postpartum state 10/06/2015  . Labor and delivery, indication for care 10/05/2015  . Seizure disorder (HCC) 01/09/2011    Past Surgical History:  Procedure Laterality Date  . WISDOM TOOTH EXTRACTION  2002     OB History    Gravida  3   Para  2   Term  2   Preterm      AB  1   Living  2     SAB  1   IAB      Ectopic      Multiple  0   Live Births  2           Family History  Problem Relation Age of Onset  . Cancer Father   . Diabetes Mother   . Hypertension Mother   . Diabetes Sister   . Mental illness Sister        bipolar  . Stroke Sister   . Cancer  Maternal Grandmother   . Diabetes Maternal Grandmother   . Hypertension Maternal Grandmother   . Cancer Maternal Grandfather     Social History   Tobacco Use  . Smoking status: Former Smoker    Quit date: 07/09/2005    Years since quitting: 15.2  . Smokeless tobacco: Never Used  Substance Use Topics  . Alcohol use: No  . Drug use: No    Home Medications Prior to Admission medications   Medication Sig Start Date End Date Taking? Authorizing Provider  ASPIRIN 81 PO Take 81 mg by mouth daily.     [provider]  Cholecalciferol (VITAMIN D) 2000 units tablet Take 4,000 Units by mouth at bedtime.    [provider]  COVID-19 mRNA vaccine, Moderna, 100 MCG/0.5ML injection AS DIRECTED 04/19/20 04/19/21  Judyann Munson, MD  diazepam (VALIUM) 5 MG tablet Take 1 tablet (5 mg total) by mouth every 12 (twelve) hours as needed for anxiety or muscle spasms. 12/22/19   Khatri, Hina, PA-C  dicyclomine (BENTYL) 20 MG tablet Take 1 tablet (20 mg total) by mouth 2 (two) times daily. 11/21/19   Belfi,  Shawna Orleans, MD  folic acid (FOLVITE) 1 MG tablet 4 mg.  12/19/15   [provider]  hyoscyamine (LEVSIN SL) 0.125 MG SL tablet Place 0.125 mg under the tongue daily as needed for pain. 12/14/19   [provider]  INCASSIA 0.35 MG tablet Take 1 tablet by mouth at bedtime. 12/14/19   [provider]  lactobacillus acidophilus (BACID) TABS tablet Take 1 tablet by mouth daily.    [provider]  Multiple Vitamin (MULTIVITAMIN) tablet Take 1 tablet by mouth daily.    [provider]  omeprazole (PRILOSEC) 20 MG capsule Take 20 mg by mouth daily.    [provider]  Oxcarbazepine (TRILEPTAL) 300 MG tablet Take 300 mg by mouth 2 (two) times daily.     [provider]  rosuvastatin (CRESTOR) 5 MG tablet Take 5 mg by mouth daily.     [provider]    Allergies    Codeine, Vancomycin, Penicillins, Sulfa antibiotics,  Erythromycin base, Cefdinir, and Erythromycin  Review of Systems   Review of Systems  All other systems reviewed and are negative.   Physical Exam Updated Vital Signs BP 117/77   Pulse 80   Temp 98.4 F (36.9 C) (Oral)   Resp 16   Ht 5\' 5"  (1.651 m)   Wt 111.1 kg   LMP 10/06/2020   SpO2 100%   BMI 40.77 kg/m   Physical Exam Vitals and nursing note reviewed.  Constitutional:      Appearance: She is well-developed.  HENT:     Head: Normocephalic and atraumatic.     Mouth/Throat:     Mouth: Mucous membranes are moist.     Pharynx: Oropharynx is clear.  Eyes:     Pupils: Pupils are equal, round, and reactive to light.  Cardiovascular:     Rate and Rhythm: Normal rate and regular rhythm.  Pulmonary:     Effort: No respiratory distress.     Breath sounds: No stridor.  Abdominal:     General: Abdomen is flat. There is no distension.  Musculoskeletal:        General: No swelling or tenderness. Normal range of motion.     Cervical back: Normal range of motion.  Skin:    General: Skin is warm and dry.     Comments: 1.2 cm laceration to dorsal surface of proximal phalynx with mild oozing of blood  Neurological:     General: No focal deficit present.     Mental Status: She is alert.     ED Results / Procedures / Treatments   Labs (all labs ordered are listed, but only abnormal results are displayed) Labs Reviewed - No data to display  EKG None  Radiology No results found.  Procedures .04/16/2022Laceration Repair  Date/Time: 10/14/2020 3:13 AM Performed by: 10/16/2020, MD Authorized by: Marily Memos, MD   Consent:    Consent obtained:  Verbal   Consent given by:  Patient   Risks discussed:  Infection, need for additional repair, nerve damage, poor wound healing, poor cosmetic result, pain, retained foreign body, tendon damage and vascular damage   Alternatives discussed:  No treatment, delayed treatment and observation Universal protocol:    Procedure explained  and questions answered to patient or proxy's satisfaction: yes     Site/side marked: yes     Patient identity confirmed:  Verbally with patient Anesthesia:    Anesthesia method:  Topical application   Topical anesthetic:  LET Laceration details:    Length (  cm):  1.2   Depth (mm):  2 Pre-procedure details:    Preparation:  Patient was prepped and draped in usual sterile fashion and imaging obtained to evaluate for foreign bodies Exploration:    Wound exploration: wound explored through full range of motion   Treatment:    Area cleansed with:  Saline   Amount of cleaning:  Extensive   Irrigation solution:  Sterile water   Irrigation volume:  50   Irrigation method:  Syringe   Visualized foreign bodies/material removed: no     Debridement:  None   Undermining:  None   Scar revision: no   Skin repair:    Repair method:  Sutures   Suture size:  5-0   Suture material:  Prolene   Suture technique:  Simple interrupted   Number of sutures:  2 Approximation:    Approximation:  Close Repair type:    Repair type:  Simple Post-procedure details:    Dressing:  Antibiotic ointment, adhesive bandage, tube gauze and splint for protection   Procedure completion:  Tolerated well, no immediate complications     Medications Ordered in ED Medications  lidocaine-EPINEPHrine-tetracaine (LET) topical gel (3 mLs Topical Given 10/14/20 0144)  Tdap (BOOSTRIX) injection 0.5 mL (0.5 mLs Intramuscular Given 10/14/20 0144)    ED Course  I have reviewed the triage vital signs and the nursing notes.  Pertinent labs & imaging results that were available during my care of the patient were reviewed by me and considered in my medical decision making (see chart for details).    MDM Rules/Calculators/A&P                          Not deep enough to be concerned for ligament involvement and has good ROM. NVI otherwise as well. No indication for XR. TDAP updated. Sutures placed. No indication for abx.  Advised wound care and suture removal in approx 10 days.  Final Clinical Impression(s) / ED Diagnoses Final diagnoses:  Laceration of right index finger without foreign body without damage to nail, initial encounter    Rx / DC Orders ED Discharge Orders    None       Dondi Aime, Barbara Cower, MD 10/14/20 [phone]

## 2020-10-14 NOTE — ED Triage Notes (Signed)
C/o lac to right index finger x 1 hr ago by [employer]

## 2020-11-28 DIAGNOSIS — F4312 Post-traumatic stress disorder, chronic: Secondary | ICD-10-CM | POA: Insufficient documentation

## 2020-11-28 DIAGNOSIS — F411 Generalized anxiety disorder: Secondary | ICD-10-CM | POA: Insufficient documentation

## 2020-12-16 ENCOUNTER — Ambulatory Visit (INDEPENDENT_AMBULATORY_CARE_PROVIDER_SITE_OTHER): Payer: 59 | Admitting: Obstetrics and Gynecology

## 2020-12-16 ENCOUNTER — Other Ambulatory Visit: Payer: Self-pay

## 2020-12-16 ENCOUNTER — Encounter: Payer: Self-pay | Admitting: Obstetrics and Gynecology

## 2020-12-16 VITALS — BP 118/68 | HR 84 | Resp 16 | Wt 246.0 lb

## 2020-12-16 DIAGNOSIS — Z23 Encounter for immunization: Secondary | ICD-10-CM

## 2020-12-16 DIAGNOSIS — Z7185 Encounter for immunization safety counseling: Secondary | ICD-10-CM

## 2020-12-16 DIAGNOSIS — N9412 Deep dyspareunia: Secondary | ICD-10-CM

## 2020-12-16 DIAGNOSIS — N949 Unspecified condition associated with female genital organs and menstrual cycle: Secondary | ICD-10-CM | POA: Diagnosis not present

## 2020-12-16 DIAGNOSIS — R102 Pelvic and perineal pain: Secondary | ICD-10-CM

## 2020-12-16 DIAGNOSIS — R509 Fever, unspecified: Secondary | ICD-10-CM

## 2020-12-16 MED ORDER — DOXYCYCLINE HYCLATE 100 MG PO CAPS: 100.0000 mg | ORAL_CAPSULE | Freq: Two times a day (BID) | ORAL | 0 refills | Status: DC

## 2020-12-16 NOTE — Progress Notes (Signed)
40 y.o. [id number] Married White or Caucasian Not Hispanic or Latino female here for evaluation of pelvic pain.   Period Pattern: Regular Menstrual Flow: Moderate (only 1 heavy day, then moderate flow) Menstrual Control: Maxi pad Dysmenorrhea: (!) Mild (mild cramping during cycle, intense abdominal pain other times of month) Dysmenorrhea Symptoms: Cramping  She is on POP, has been on them since 1/22. She started POP for heavy cycles and severe cramps.  Prior to starting POP in 1/22 her cycles were monthly x 5 days.  She would saturate a large pad in 4 hours. Cramps were excruciating. Very heavy flow and severe cramps were for 1-2 days. She was starting to get a low grade fever of 100 degress with her cycle. She had some pain with pain with BM prior to POP Her PA is evaluating her for an autoimmune disorder. She has joint pain. No skin changes. She is being set up to see a Rheumatologist.   Since starting POP her cycles have been much better. Cycles coming ~q 3-5 weeks. Bleeding for 5-6 days. She can saturate a pad in 24 hours. Cramps are mild.  No dyschezia on POP.   Since starting POP, she gets intense cramping 1-2 weeks prior to her cycle (one time the same week as her cycle). The cramping lasts for one day, excruciating and constant. The pain feels like a combination of her IBS pain and her menstrual type pain. She takes ASA daily secondary to the stroke. Not taking Nsaids. Tried a tylenol x 1, helped moderately. She also got a low grade fever with the cramping.   Sexually active, she has always had some deep dyspareunia. Positional.   She has a normal BM qd with daily fiber.   Last baby was born in 09/2015.  Vasectomy for contraception. She is on trileptal for epilepsy (no sz for 10 years).   Records reviewed: h/o stroke 5 weeks PP Prior gyn suspected endometriosis  Ultrasound last year: 3.4 cm elongated cystic mass in left adnexa with echos ? hydrosalpinx right ovary displaced  inferior to uterus.   11/28/20:  CRP elevated at 16 ANA negative RA negative Sed rate 27  11/29/20: Negative stool for O&P  11/21/19: CT abdomen/pelvis: uterus and adnexa are unremarkable. Likely 3 cm left ovarian cyst. IMPRESSION: Normal appearing appendix.   No other acute intra-abdominal or pelvic pathology to explain the patient's symptoms.   Probable 3 cm left ovarian cyst.  06/06/20: Negative GC/CT  Patient's last menstrual period was 11/23/2020 (exact date).          Sexually active: Yes.    The current method of family planning is vasectomy and oral progesterone-only contraceptive.    Exercising: No.   exercise Smoker:  no  Health Maintenance: Pap:  2022 per patient History of abnormal Pap:  no MMG:  none BMD:   none Colonoscopy: 03/2020 diverticulosis, otherwise normal.  TDaP:  2022 Gardasil: Thinks she had one at 26, was told she was too old to finish the series.    reports that she quit smoking about 15 years ago. Her smoking use included cigarettes. She has never used smokeless tobacco. She reports that she does not drink alcohol and does not use drugs. She is not currently working out of the home. She was missing work secondary the pain every month. Kids are 5 and 9.   Past Medical History:  Diagnosis Date   Anxiety    Depression    Endometriosis    Gestational diabetes  diet controlled   Gestational diabetes mellitus (GDM), antepartum    IBS (irritable bowel syndrome)    Migraines    Nocturnal epilepsy (HCC)    Postpartum care following vaginal delivery 01/09/2011   Seizure disorder (HCC) 01/09/2011   Seizures (HCC)    STD (sexually transmitted disease)    chlamydia treated   Stroke (HCC)    Thalassemia    Thalassemia minor    Thalassemia trait    Vertigo 08/10/2019    Past Surgical History:  Procedure Laterality Date   BACK SURGERY  08/2018   spine surgery   WISDOM TOOTH EXTRACTION  06/25/2000    Current Outpatient Medications   Medication Sig Dispense Refill   ASPIRIN 81 PO Take 81 mg by mouth daily.      Cholecalciferol (VITAMIN D) 2000 units tablet Take 2,000 Units by mouth at bedtime.     dicyclomine (BENTYL) 20 MG tablet Take 1 tablet (20 mg total) by mouth 2 (two) times daily. 20 tablet 0   FOLIC ACID PO Take 4 mg by mouth.     hydrOXYzine (ATARAX/VISTARIL) 10 MG tablet Take 10-20 mg by mouth 3 (three) times daily as needed.     INCASSIA 0.35 MG tablet Take 1 tablet by mouth at bedtime.     lactobacillus acidophilus (BACID) TABS tablet Take 1 tablet by mouth daily.     Multiple Vitamin (MULTIVITAMIN) tablet Take 1 tablet by mouth daily.     Omega-3 Fatty Acids (OMEGA 3 PO) Take by mouth.     Oxcarbazepine (TRILEPTAL) 300 MG tablet Take 300 mg by mouth 2 (two) times daily.      No current facility-administered medications for this visit.    Family History  Problem Relation Age of Onset   Diabetes Mother    Hypertension Mother    Prostate cancer Father    Diabetes Sister    Lupus Sister    Squamous cell carcinoma Sister    Diabetes Sister    Endometriosis Sister    Bipolar disorder Sister    Diabetes Sister    Fibromyalgia Sister    Endometriosis Sister    Hypertension Sister    Deep vein thrombosis Sister    Breast cancer Maternal Grandmother    Diabetes Maternal Grandmother    Hypertension Maternal Grandmother    Lung cancer Maternal Grandmother    Prostate cancer Maternal Grandfather    Squamous cell carcinoma Maternal Grandfather     Review of Systems  Constitutional: Negative.   HENT: Negative.    Eyes: Negative.   Respiratory: Negative.    Cardiovascular: Negative.   Gastrointestinal: Negative.   Endocrine: Negative.   Genitourinary:        Endometriosis, abdominal pain  Musculoskeletal: Negative.   Skin: Negative.   Allergic/Immunologic: Negative.   Neurological: Negative.   Hematological: Negative.   Psychiatric/Behavioral: Negative.     Exam:   BP 118/68   Pulse 84    Resp 16   Wt 246 lb (111.6 kg)   LMP 11/23/2020 (Exact Date)   BMI 40.94 kg/m   Weight change: @WEIGHTCHANGE @ Height:      Ht Readings from Last 3 Encounters:  10/14/20 5\' 5"  (1.651 m)  12/22/19 5\' 5"  (1.651 m)  11/21/19 5\' 5"  (1.651 m)    General appearance: alert, cooperative and appears stated age Head: Normocephalic, without obvious abnormality, atraumatic Abdomen: soft, tender in BLQ, no rebound, no guarding, mildly distended,  no masses,  no organomegaly Extremities: extremities normal, atraumatic, no cyanosis or  edema Skin: Skin color, texture, turgor normal. No rashes or lesions Lymph nodes: Cervical, supraclavicular, and axillary nodes normal. No abnormal inguinal nodes palpated Neurologic: Grossly normal   Pelvic: External genitalia:  no lesions              Urethra:  normal appearing urethra with no masses, tenderness or lesions              Bartholins and Skenes: normal                 Vagina: normal appearing vagina with normal color slight increase in yellow d/c (no symptoms)              Cervix: no cervical motion tenderness and no lesions               Bimanual Exam:  Uterus:   anteverted, mobile, tender, normal sized              Adnexa:  no masses, bilateral tenderness               Rectovaginal: Confirms               Anus:  normal sphincter tone, no lesions  Cornelia Copa chaperoned for the exam.  1. Pelvic pain She is having severe pelvic pain 1-2 weeks prior to her cycle since starting POP. The severe dysmenorrhea has improved. Sounds like she is having ovulatory pain.  Also has IBS, essentially negative colonoscopy in 10/21.  -Not a candidate for OCP's or depo provera -Discussed that GnrH agonists and antagonists were only short term option -Calendar cycles and pain -Continue POP, I don't think a mirena would help the mid cycle pain - US PELVIS TRANSVAGINAL NON-OB (TV ONLY); Future - SURESWAB CT/NG/T. Vaginalis -Discussed diagnostic laparoscopy with  treatment of possible endometriosis -Typically we don't like to remove ovaries in someone her age  69. Uterine tenderness She is tender on palpation of her lower abdomen, her uterus and adnexa. No CMT, long term monogamous relationship. In addition to GYN pain she has pain from her IBS. -Will treat for possible endometritis - doxycycline (VIBRAMYCIN) 100 MG capsule; Take 1 capsule (100 mg total) by mouth 2 (two) times daily. Take BID for 10 days.  Take with food as can cause GI distress.  Dispense: 20 capsule; Refill: 0 - SURESWAB CT/NG/T. vaginalis  3. Tenderness of female pelvic organs Prior ultrasound with possible hydrosalpinx and possible fixed ovaries.  - US PELVIS TRANSVAGINAL NON-OB (TV ONLY); Future - SURESWAB CT/NG/T. vaginalis  4. Immunization counseling Discussed gardasil vaccination, she would like to continue the series (had one vaccination at 26) - HPV 9-valent vaccine,Recombinat  5. Deep dyspareunia See above plans for pelvic pain She will continue to control rate and depth of penetration  6. Low grade fever Only occurs with her cramps, being evaluated for autoimmune disorder Genprobe pending Ultrasound ordered.

## 2020-12-16 NOTE — Patient Instructions (Signed)
Pelvic Pain, Female Pelvic pain is pain in your lower abdomen, below your belly button and between your hips. The pain may start suddenly (be acute), keep coming back (be recurring), or last a long time (become chronic). Pelvic pain that lasts longer than 6 months is considered chronic. Pelvic pain may affect your: Reproductive organs. Urinary system. Digestive tract. Musculoskeletal system. There are many potential causes of pelvic pain. Sometimes, the pain can be a result of digestive or urinary conditions, strained muscles or ligaments, orreproductive conditions. Sometimes the cause of pelvic pain is not known. Follow these instructions at home:  Take over-the-counter and prescription medicines only as told by your health care provider. Rest as told by your health care provider. Do not have sex if it hurts. Keep a journal of your pelvic pain. Write down: When the pain started. Where the pain is located. What seems to make the pain better or worse, such as food or your period (menstrual cycle). Any symptoms you have along with the pain. Keep all follow-up visits as told by your health care provider. This is important. Contact a health care provider if: Medicine does not help your pain. Your pain comes back. You have new symptoms. You have abnormal vaginal discharge or bleeding, including bleeding after menopause. You have a fever or chills. You are constipated. You have blood in your urine or stool. You have foul-smelling urine. You feel weak or light-headed. Get help right away if: You have sudden severe pain. Your pain gets steadily worse. You have severe pain along with fever, nausea, vomiting, or excessive sweating. You lose consciousness. Summary Pelvic pain is pain in your lower abdomen, below your belly button and between your hips. There are many potential causes of pelvic pain. Keep a journal of your pelvic pain. This information is not intended to replace advice  given to you by your health care provider. Make sure you discuss any questions you have with your healthcare provider. Document Revised: 11/27/2017 Document Reviewed: 11/27/2017 Elsevier Patient Education  2022 [employer]  

## 2020-12-17 ENCOUNTER — Other Ambulatory Visit: Payer: Self-pay

## 2020-12-17 ENCOUNTER — Encounter: Payer: Self-pay | Admitting: Obstetrics and Gynecology

## 2020-12-17 ENCOUNTER — Emergency Department (HOSPITAL_COMMUNITY)
Admission: EM | Admit: 2020-12-17 | Discharge: 2020-12-17 | Disposition: A | Discharge status: Home / Self Care | Payer: 59 | Attending: Emergency Medicine | Admitting: Emergency Medicine

## 2020-12-17 DIAGNOSIS — R42 Dizziness and giddiness: Secondary | ICD-10-CM | POA: Insufficient documentation

## 2020-12-17 DIAGNOSIS — Z20822 Contact with and (suspected) exposure to covid-19: Secondary | ICD-10-CM | POA: Diagnosis not present

## 2020-12-17 DIAGNOSIS — G8929 Other chronic pain: Secondary | ICD-10-CM | POA: Insufficient documentation

## 2020-12-17 DIAGNOSIS — Z7982 Long term (current) use of aspirin: Secondary | ICD-10-CM | POA: Diagnosis not present

## 2020-12-17 DIAGNOSIS — R102 Pelvic and perineal pain: Secondary | ICD-10-CM | POA: Insufficient documentation

## 2020-12-17 DIAGNOSIS — R251 Tremor, unspecified: Secondary | ICD-10-CM | POA: Diagnosis not present

## 2020-12-17 DIAGNOSIS — Z87891 Personal history of nicotine dependence: Secondary | ICD-10-CM | POA: Insufficient documentation

## 2020-12-17 DIAGNOSIS — R5383 Other fatigue: Secondary | ICD-10-CM | POA: Diagnosis not present

## 2020-12-17 LAB — RESP PANEL BY RT-PCR (FLU A&B, COVID) ARPGX2
Influenza A by PCR: NEGATIVE
Influenza B by PCR: NEGATIVE
SARS Coronavirus 2 by RT PCR: NEGATIVE

## 2020-12-17 LAB — URINALYSIS, ROUTINE W REFLEX MICROSCOPIC
Bilirubin Urine: NEGATIVE
Glucose, UA: NEGATIVE mg/dL
Hgb urine dipstick: NEGATIVE
Ketones, ur: NEGATIVE mg/dL
Nitrite: NEGATIVE
Protein, ur: NEGATIVE mg/dL
Specific Gravity, Urine: 1.011 (ref 1.005–1.030)
pH: 7 (ref 5.0–8.0)

## 2020-12-17 LAB — CBC WITH DIFFERENTIAL/PLATELET
Abs Immature Granulocytes: 0.03 10*3/uL (ref 0.00–0.07)
Basophils Absolute: 0 10*3/uL (ref 0.0–0.1)
Basophils Relative: 0 %
Eosinophils Absolute: 0.2 10*3/uL (ref 0.0–0.5)
Eosinophils Relative: 2 %
HCT: 38.1 % (ref 36.0–46.0)
Hemoglobin: 11.6 g/dL — ABNORMAL LOW (ref 12.0–15.0)
Immature Granulocytes: 0 %
Lymphocytes Relative: 20 %
Lymphs Abs: 1.5 10*3/uL (ref 0.7–4.0)
MCH: 20.4 pg — ABNORMAL LOW (ref 26.0–34.0)
MCHC: 30.4 g/dL (ref 30.0–36.0)
MCV: 67 fL — ABNORMAL LOW (ref 80.0–100.0)
Monocytes Absolute: 0.5 10*3/uL (ref 0.1–1.0)
Monocytes Relative: 7 %
Neutro Abs: 5.3 10*3/uL (ref 1.7–7.7)
Neutrophils Relative %: 71 %
Platelets: 227 10*3/uL (ref 150–400)
RBC: 5.69 MIL/uL — ABNORMAL HIGH (ref 3.87–5.11)
RDW: 15.4 % (ref 11.5–15.5)
WBC: 7.5 10*3/uL (ref 4.0–10.5)
nRBC: 0 % (ref 0.0–0.2)

## 2020-12-17 LAB — COMPREHENSIVE METABOLIC PANEL
ALT: 24 U/L (ref 0–44)
AST: 18 U/L (ref 15–41)
Albumin: 4.7 g/dL (ref 3.5–5.0)
Alkaline Phosphatase: 60 U/L (ref 38–126)
Anion gap: 9 (ref 5–15)
BUN: 14 mg/dL (ref 6–20)
CO2: 24 mmol/L (ref 22–32)
Calcium: 9.5 mg/dL (ref 8.9–10.3)
Chloride: 105 mmol/L (ref 98–111)
Creatinine, Ser: 0.71 mg/dL (ref 0.44–1.00)
GFR, Estimated: >60 mL/min (ref >60)
Glucose, Bld: 130 mg/dL — ABNORMAL HIGH (ref 70–99)
Potassium: 4 mmol/L (ref 3.5–5.1)
Sodium: 138 mmol/L (ref 135–145)
Total Bilirubin: 0.4 mg/dL (ref 0.3–1.2)
Total Protein: 7.6 g/dL (ref 6.5–8.1)

## 2020-12-17 LAB — CBG MONITORING, ED: Glucose-Capillary: 124 mg/dL — ABNORMAL HIGH (ref 70–99)

## 2020-12-17 LAB — LIPASE, BLOOD: Lipase: 37 U/L (ref 11–51)

## 2020-12-17 LAB — I-STAT BETA HCG BLOOD, ED (MC, WL, AP ONLY): I-stat hCG, quantitative: <5 m[IU]/mL (ref <5)

## 2020-12-17 MED ORDER — SODIUM CHLORIDE 0.9 % IV BOLUS
1000.0000 mL | Freq: Once | INTRAVENOUS | Status: AC
  Administered 2020-12-17: 1000 mL via INTRAVENOUS

## 2020-12-17 NOTE — ED Notes (Signed)
Called lab to check on Urinalysis results, lab said they would run the test as soon as possible.

## 2020-12-17 NOTE — ED Provider Notes (Signed)
[facility]-EMERGENCY DEPT Provider Note   CSN: [id number] Arrival date & time: 12/17/20  1705     History Chief Complaint  Patient presents with   Dizziness    Marilyn Black is a 40 y.o. 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    Patient presents emerged part with chief complaint of fatigue, lightheadedness, and shaky feeling.  Patient reports that her symptoms started approximately 1 hour after taking her medications.  Patient took medications at 0830.  Started on doxycycline yesterday by her OB/GYN provider for concern of infection.  She took this medication on a full stomach this morning.  She reports that she had to take a nap today due to her fatigue which is abnormal for her.  Reports her lightheadedness is constant.  Lightheadedness is present at rest.  Lightheadedness is worse when standing or moving.  Patient denies any syncopal episodes, slurred speech, numbness, weakness, visual disturbance, headache, chest pain, shortness of breath, palpitations, leg swelling.  Endorses nausea earlier today.  No episodes of vomiting at present.  She endorses chronic pelvic pain and abdominal pain x1 year.  Patient reports that she has been seen by multiple providers for this complaint.  Pain is improved today after starting doxycycline.  Patient reports previously taking doxycycline without incident.  Denies any fever, chills, melena, blood in stool, dysuria, hematuria, vaginal bleeding, vaginal bleeding, vaginal discharge, neck pain, back pain.  Patient reports that daughter has a cough.  Denies any other sick contacts.  Patient is fully vaccinated for COVID-19 and influenza.  No recent falls or injuries.  Patient reports that she had gonorrhea and Chlamydia testing performed yesterday by OB/GYN provider.  Patient is sexually active with a female partner in a mutually monogamous  relationship.     Dizziness Associated symptoms: no blood in stool, no chest pain, no diarrhea, no headaches, no nausea, no palpitations, no shortness of breath, no vomiting and no weakness       Past Medical History:  Diagnosis Date   Anxiety    Depression    Endometriosis    Gestational diabetes    diet controlled   Gestational diabetes mellitus (GDM), antepartum    IBS (irritable bowel syndrome)    Migraines    Nocturnal epilepsy (HCC)    Postpartum care following vaginal delivery 01/09/2011   Seizure disorder (HCC) 01/09/2011   Seizures (HCC)    STD (sexually transmitted disease)    chlamydia treated   Stroke (HCC)    Thalassemia    Thalassemia minor    Thalassemia trait    Vertigo 08/10/2019    Patient Active Problem List   Diagnosis Date Noted   Vertigo 08/10/2019   Seizures (HCC) 01/17/2016   Stroke (cerebrum) (HCC)    Postpartum hemorrhage of vagina 11/14/2015   Hyperlipidemia with target LDL less than 70 11/14/2015   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   Neck pain 11/13/2015   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)    SVD (spontaneous vaginal delivery) 10/06/2015   Postpartum care following vaginal delivery (4/13) 10/06/2015   Postpartum state 10/06/2015   Labor and delivery, indication for care 10/05/2015   Seizure disorder (HCC) 01/09/2011    Past Surgical History:  Procedure Laterality Date   BACK SURGERY  08/2018   spine surgery   WISDOM TOOTH EXTRACTION  06/25/2000     OB History     Gravida  3   Para  2   Term  2   Preterm      AB  1   Living  2      SAB  1   IAB      Ectopic      Multiple  0   Live Births  2           Family History  Problem Relation Age of Onset   Diabetes Mother    Hypertension Mother    Prostate cancer Father    Diabetes Sister    Lupus Sister    Squamous cell carcinoma Sister    Diabetes Sister    Endometriosis Sister    Bipolar disorder  Sister    Diabetes Sister    Fibromyalgia Sister    Endometriosis Sister    Hypertension Sister    Deep vein thrombosis Sister    Breast cancer Maternal Grandmother    Diabetes Maternal Grandmother    Hypertension Maternal Grandmother    Lung cancer Maternal Grandmother    Prostate cancer Maternal Grandfather    Squamous cell carcinoma Maternal Grandfather     Social History   Tobacco Use   Smoking status: Former    Pack years: 0.00    Types: Cigarettes    Quit date: 07/09/2005    Years since quitting: 15.4   Smokeless tobacco: Never  Substance Use Topics   Alcohol use: No   Drug use: No    Home Medications Prior to Admission medications   Medication Sig Start Date End Date Taking? Authorizing Provider  ASPIRIN 81 PO Take 81 mg by mouth daily.     [provider]  Cholecalciferol (VITAMIN D) 2000 units tablet Take 2,000 Units by mouth at bedtime.    [provider]  dicyclomine (BENTYL) 20 MG tablet Take 1 tablet (20 mg total) by mouth 2 (two) times daily. 11/21/19   Rolan Bucco, MD  doxycycline (VIBRAMYCIN) 100 MG capsule Take 1 capsule (100 mg total) by mouth 2 (two) times daily. Take BID for 10 days.  Take with food as can cause GI distress. 12/16/20   Romualdo Bolk, MD  FOLIC ACID PO Take 4 mg by mouth.    [provider]  hydrOXYzine (ATARAX/VISTARIL) 10 MG tablet Take 10-20 mg by mouth 3 (three) times daily as needed. 11/28/20   [provider]  INCASSIA 0.35 MG tablet Take 1 tablet by mouth at bedtime. 12/14/19   [provider]  lactobacillus acidophilus (BACID) TABS tablet Take 1 tablet by mouth daily.    [provider]  Multiple Vitamin (MULTIVITAMIN) tablet Take 1 tablet by mouth daily.    [provider]  Omega-3 Fatty Acids (OMEGA 3 PO) Take by mouth.    [provider]  Oxcarbazepine (TRILEPTAL) 300 MG tablet Take 300 mg by mouth 2 (two) times daily.     [provider]     Allergies    Codeine, Vancomycin, Escitalopram, Penicillins, Sulfa antibiotics, Erythromycin base, and Cefdinir  Review of Systems   Review of Systems  Constitutional:  Negative for chills and fever.  HENT:  Negative for congestion, rhinorrhea and sore throat.   Eyes:  Negative for visual disturbance.  Respiratory:  Negative for cough and shortness of breath.   Cardiovascular:  Negative for chest pain, palpitations and leg swelling.  Gastrointestinal:  Positive for abdominal pain (chronic, improved). Negative for abdominal distention, blood in stool, constipation, diarrhea, nausea, rectal pain and vomiting.  Genitourinary:  Positive for pelvic pain (chronic, improved). Negative for difficulty urinating, dysuria, frequency, genital sores, hematuria, vaginal bleeding, vaginal discharge and vaginal pain.  Musculoskeletal:  Negative for back pain, myalgias and neck pain.  Skin:  Negative for color change, pallor, rash and wound.  Neurological:  Positive for dizziness and light-headedness. Negative for tremors, seizures, syncope, facial asymmetry, speech difficulty, weakness, numbness and headaches.  Psychiatric/Behavioral:  Negative for confusion.    Physical Exam Updated Vital Signs BP (!) 145/90 (BP Location: Right Arm)   Pulse 100   Temp 98.4 F (36.9 C) (Oral)   Resp 16   Ht 5\' 5"  (1.651 m)   Wt 111.6 kg   LMP 11/23/2020 (Exact Date)   SpO2 100%   BMI 40.94 kg/m   Physical Exam Vitals and nursing note reviewed.  Constitutional:      General: She is not in acute distress.    Appearance: She is not ill-appearing, toxic-appearing or diaphoretic.  HENT:     Head: Normocephalic and atraumatic. No raccoon eyes, abrasion, contusion, masses, right periorbital erythema, left periorbital erythema or laceration.     Jaw: No trismus or pain on movement.     Mouth/Throat:     Mouth: Mucous membranes are moist.     Pharynx: Oropharynx is clear. Uvula midline. No pharyngeal swelling,  oropharyngeal exudate, posterior oropharyngeal erythema or uvula swelling.     Tonsils: No tonsillar exudate or tonsillar abscesses.  Eyes:     General: No scleral icterus.       Right eye: No discharge.        Left eye: No discharge.     Extraocular Movements: Extraocular movements intact.     Conjunctiva/sclera: Conjunctivae normal.     Pupils: Pupils are equal, round, and reactive to light.  Cardiovascular:     Rate and Rhythm: Normal rate.     Pulses:          Radial pulses are 2+ on the right side and 2+ on the left side.     Heart sounds: Normal heart sounds.  Pulmonary:     Effort: Pulmonary effort is normal. No tachypnea, bradypnea or respiratory distress.     Breath sounds: Normal breath sounds. No stridor.     Comments: Patient able to speak in full complete sentences without difficulty Abdominal:     General: Bowel sounds are normal. There is no distension. There are no signs of injury.     Palpations: Abdomen is soft. There is no mass or pulsatile mass.     Tenderness: There is generalized abdominal tenderness. There is no guarding or rebound.     Comments: Minimal generalized tenderness to abdomen  Musculoskeletal:     Cervical back: Normal range of motion and neck supple. No rigidity.     Right lower leg: Normal.     Left lower leg: Normal.  Skin:    General: Skin is warm and dry.     Coloration: Skin is not ashen, cyanotic, jaundiced or pale.  Neurological:     General: No focal deficit present.     Mental Status: She is alert and oriented to person, place, and time.     GCS: GCS eye subscore is 4. GCS verbal subscore is 5. GCS motor subscore is 6.     Cranial Nerves: No cranial nerve deficit or facial asymmetry.     Sensory: Sensation is intact.     Motor: No weakness, tremor, seizure activity or pronator drift.     Coordination:  Finger-Nose-Finger Test normal.     Comments: CN II-XII intact; performed in supine position, +5 strength to bilateral upper  extremities, +5 strength to dorsiflexion and plantarflexion, patient able to left both legs against gravity and hold each there without difficulty, sensation to light touch intact to bilateral upper and lower extremities  Psychiatric:        Behavior: Behavior is cooperative.    ED Results / Procedures / Treatments   Labs (all labs ordered are listed, but only abnormal results are displayed) Labs Reviewed  CBC WITH DIFFERENTIAL/PLATELET - Abnormal; Notable for the following components:      Result Value   RBC 5.69 (*)    Hemoglobin 11.6 (*)    MCV 67.0 (*)    MCH 20.4 (*)    All other components within normal limits  COMPREHENSIVE METABOLIC PANEL - Abnormal; Notable for the following components:   Glucose, Bld 130 (*)    All other components within normal limits  URINALYSIS, ROUTINE W REFLEX MICROSCOPIC - Abnormal; Notable for the following components:   Leukocytes,Ua MODERATE (*)    Bacteria, UA RARE (*)    All other components within normal limits  CBG MONITORING, ED - Abnormal; Notable for the following components:   Glucose-Capillary 124 (*)    All other components within normal limits  RESP PANEL BY RT-PCR (FLU A&B, COVID) ARPGX2  LIPASE, BLOOD  I-STAT BETA HCG BLOOD, ED ([facility], [facility], [facility] ONLY)    EKG None  Radiology No results found.  Procedures Procedures   Medications Ordered in ED Medications  sodium chloride 0.9 % bolus 1,000 mL (0 mLs Intravenous Stopped 12/17/20 1910)    ED Course  I have reviewed the triage vital signs and the nursing notes.  Pertinent labs & imaging results that were available during my care of the patient were reviewed by me and considered in my medical decision making (see chart for details).    MDM Rules/Calculators/A&P                          Alert 40 year old female no acute distress, nontoxic-appearing.  Patient presents emergency department with a chief complaint of shakiness, dizziness, and fatigue.  Patient reports that her  symptoms started earlier this morning.  Patient was recently started on doxycycline yesterday by OB/GYN provider for possible pelvic infection.  Patient was swabbed for gonorrhea and chlamydia yesterday, test results are pending.  Patient reports that daughter has a cough at home  Lightheadedness is constant and worse when standing or ambulating.  Denies any syncopal episodes.  No reports of neurological deficits.  No chest pain, shortness of breath, URI symptoms, reports of blood loss.  Physical exam patient has no focal neurological deficits.  Lungs clear to auscultation bilaterally.  S1, S2 present with no murmurs rubs or gallops.  +2 radial pulse bilaterally.  Abdomen soft, nondistended, generalized tenderness throughout abdomen.  No guarding, rebound tenderness.  Will obtain EKG, CMP, CBC, urinalysis, POC CBG, and i-STAT pregnancy test.  We will give patient 1 L fluid bolus.  POC CBG 126 EKG shows sinus tachycardia at rate of 100 CMP is unremarkable.  Low suspicion for hepatobiliary disease. CBC shows hemoglobin decreased at 11.6, appears baseline due to patient's history of thalassemia. Lipase within normal limits; low suspicion for acute pancreatitis. Pregnancy test negative. Aspiratory panel negative for COVID-19 and influenza. Urinalysis shows no signs of infection.  Patient reports minimal improvement after receiving 1 L fluid bolus.  Will obtain orthostatic vital signs. Orthostatic vital signs show no orthostatic hypotension.  Patient reports resolution of her symptoms.  Patient hemodynamically stable.  Will discharge patient at this time.  Patient vies to follow-up with primary care provider.  Patient to contact OB/GYN provider about discontinuing doxycycline and symptoms continue with further medication use.  Patient given strict return precautions.  Patient expressed understanding of all instructions and is agreeable to this plan.   Final Clinical Impression(s) / ED  Diagnoses Final diagnoses:  Lightheadedness    Rx / DC Orders ED Discharge Orders     None        Berneice Heinrich 12/18/20 0154    Bethann Berkshire, MD 12/19/20 [phone]

## 2020-12-17 NOTE — ED Triage Notes (Addendum)
Light headed, dizzy, lethargic, nauseated, thirsty , shaky x2 days. Denies history of dm2. Denies pain. cbg in triage 124.

## 2020-12-17 NOTE — Discharge Instructions (Addendum)
You came to the emergency department today to be evaluated for your lightheadedness, shakiness, and fatigue.  Your physical exam, lab work, and EKG were reassuring.  Your symptoms improved after receiving IV fluids.  Continue to stay well-hydrated.  If you have continued symptoms please schedule a follow-up appointment with your primary care provider.  If the symptoms continue to revolve around her doxycycline medication please contact your OB/GYN provider and see if he can discontinue this medication.  Get help right away if: You vomit or have diarrhea and are unable to eat or drink anything. You have problems talking, walking, swallowing, or using your arms, hands, or legs. You feel generally weak. You have any bleeding. You are not thinking clearly or you have trouble forming sentences. It may take a friend or family member to notice this. You have chest pain, abdominal pain, shortness of breath, or sweating. Your vision changes or you develop a severe headache.

## 2020-12-18 ENCOUNTER — Encounter: Payer: Self-pay | Admitting: Emergency Medicine

## 2020-12-18 ENCOUNTER — Telehealth: Payer: 59 | Admitting: Emergency Medicine

## 2020-12-18 DIAGNOSIS — R531 Weakness: Secondary | ICD-10-CM | POA: Diagnosis not present

## 2020-12-18 DIAGNOSIS — R8289 Other abnormal findings on cytological and histological examination of urine: Secondary | ICD-10-CM

## 2020-12-18 DIAGNOSIS — G8929 Other chronic pain: Secondary | ICD-10-CM

## 2020-12-18 DIAGNOSIS — R1031 Right lower quadrant pain: Secondary | ICD-10-CM

## 2020-12-18 DIAGNOSIS — R1032 Left lower quadrant pain: Secondary | ICD-10-CM

## 2020-12-18 MED ORDER — NITROFURANTOIN MONOHYD MACRO 100 MG PO CAPS: 100.0000 mg | ORAL_CAPSULE | Freq: Two times a day (BID) | ORAL | 0 refills | Status: DC

## 2020-12-18 NOTE — Patient Instructions (Signed)
  Please call your OB/GYN tomorrow to discuss your emergency room visit and labs as well as today's video visit and taking Macrobid with doxycyline.  Call 911 or have someone drive you back to the emergency department if you develop worsening lightheadedness, severe abdominal pain, unable to keep down fluids, or other new concerning symptoms develop.

## 2020-12-18 NOTE — Progress Notes (Signed)
Ms. Marilyn, Black are scheduled for a virtual visit with your provider today.    Just as we do with appointments in the office, we must obtain your consent to participate.  Your consent will be active for this visit and any virtual visit you may have with one of our providers in the next 365 days.    If you have a MyChart account, I can also send a copy of this consent to you electronically.  All virtual visits are billed to your insurance company just like a traditional visit in the office.  As this is a virtual visit, video technology does not allow for your provider to perform a traditional examination.  This may limit your provider's ability to fully assess your condition.  If your provider identifies any concerns that need to be evaluated in person or the need to arrange testing such as labs, EKG, etc, we will make arrangements to do so.    Although advances in technology are sophisticated, we cannot ensure that it will always work on either your end or our end.  If the connection with a video visit is poor, we may have to switch to a telephone visit.  With either a video or telephone visit, we are not always able to ensure that we have a secure connection.   I need to obtain your verbal consent now.   Are you willing to proceed with your visit today?   Marilyn Black has provided verbal consent on 12/18/2020 for a virtual visit (video or telephone).   Marilyn Shadow, PA-C 12/18/2020  9:37 AM   Date:  12/18/2020   ID:  [name], DOB 30-Dec-1980, MRN [id number]  Patient Location: Home Provider Location: Home Office   Participants: Patient and Provider for Visit and Wrap up  Method of visit: Video  Location of Patient: Home Location of Provider: Home Office Consent was obtain for visit over the video. Services rendered by provider: Visit was performed via video  A video enabled telemedicine application was used and I verified that I am speaking with the correct person using two  identifiers.  PCP:  Porfirio Oar, PA   Chief Complaint:  "possible UTI"  History of Present Illness:    Marilyn Black is a 40 y.o. female with history as stated below. Presents video telehealth for an acute care visit  Onset of symptoms was yesterday and symptoms have been persistent and include: mild intermittent lightheadedness and chronic abdominal pain that is currently being tx with doxycycline by her OB/GYN, for suspected endometritis.  She was unable to give a urine sample the other day when seeing her OB/GYN.  Tests are pending for GC/Chlamydia, however, pt is doubtful she has an STI at this time.   She was seen in the ED yesterday for lightheadedness and weakness. She was given 1L of IV fluids and felt better, was discharged home prior to her UA resulting.  She saw the UA results in her MyChart account this morning and wonders if that could be the cause of her new symptoms.   A urine culture does not appear to have been ordered yet.    Denies having fevers, chills, shortness of breath, cough, chest pain, ear pain, sore throat or exposure to covid or other sick contacts.   No other aggravating or relieving factors.  No other c/o.   Past Medical, Surgical, Social History, Allergies, and Medications have been Reviewed.  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 , Disp: , Rfl:    Cholecalciferol (VITAMIN D) 2000 units tablet, Take 2,000 Units by mouth at bedtime., Disp: , Rfl:    dicyclomine (BENTYL) 20 MG tablet, Take 1 tablet (20 mg total) by mouth 2 (two) times daily., Disp: 20 tablet, Rfl: 0   doxycycline (VIBRAMYCIN) 100 MG capsule, Take 1 capsule (100 mg total) by mouth 2 (two) times daily. Take BID for 10 days.  Take with food as can cause GI distress., Disp: 20 capsule, Rfl: 0   FOLIC ACID PO, Take 4 mg by mouth., Disp: , Rfl:    hydrOXYzine (ATARAX/VISTARIL) 10 MG tablet, Take 10-20 mg by mouth 3 (three) times daily as needed., Disp: , Rfl:    INCASSIA 0.35 MG tablet, Take 1 tablet by mouth at bedtime., Disp: , Rfl:    lactobacillus acidophilus (BACID) TABS tablet, Take 1 tablet by mouth daily., Disp: , Rfl:    Multiple Vitamin (MULTIVITAMIN) tablet, Take 1 tablet by mouth daily., Disp: , Rfl:    Omega-3 Fatty Acids (OMEGA 3 PO), Take by mouth., Disp: , Rfl:    Oxcarbazepine (TRILEPTAL) 300 MG tablet, Take 300 mg by mouth 2 (two) times daily. , Disp: , Rfl:    Allergies  Allergen Reactions   Codeine Other (See Comments)    fever   Vancomycin Anaphylaxis   Escitalopram Other (See Comments)   Penicillins Rash    Has patient had a PCN reaction causing immediate rash, facial/tongue/throat swelling, SOB or lightheadedness with hypotension: Yes Has patient had a PCN reaction causing severe rash involving mucus membranes or skin necrosis: No Has patient had a PCN reaction that required hospitalization No Has patient had a PCN reaction occurring within the last 10 years: No If all of the above answers are "NO", then may proceed with Cephalosporin use.   Sulfa Antibiotics Rash   Erythromycin  Base    Cefdinir Other (See Comments) and Rash    Other reaction(s): Fever (intolerance)     ROS See HPI for history of present illness.  Physical Exam Constitutional:      General: She is not in acute distress.    Appearance: Normal appearance. She is obese. She is not ill-appearing, toxic-appearing or diaphoretic.     Comments: Is at home in her kitchen making breakfast. No acute distress. Appears well.   HENT:     Head: Normocephalic.  Eyes:     Extraocular Movements: Extraocular movements intact.  Pulmonary:     Effort: Pulmonary effort is normal. No respiratory distress.  Musculoskeletal:  Cervical back: Normal range of motion.  Neurological:     Mental Status: She is alert.  Psychiatric:        Mood and Affect: Mood normal.        Behavior: Behavior normal.              A&P  Generalized weakness Abnormal urine cytology 3. Chronic lowe abdominal pain  Suspect UTI, will add Macrobid 100mg  Twice daily for 7 days.  Chart states pt is lactating but pt denies breastfeeding at this time.  Encouraged to call her OB/GYN in the morning to discuss doxycycline and macrobid for yesterday and today's symptoms and lab findings.  Advised to return to ED if symptoms worsening today.     Patient voiced understanding and agreement to plan.   Time:   Today, I have spent 15 minutes with the patient with telehealth technology discussing the above problems, reviewing the chart, previous notes, medications and orders.    Tests Ordered: No orders of the defined types were placed in this encounter.   Medication Changes: No orders of the defined types were placed in this encounter.    Disposition:  Follow up OB/GYN tomorrow. Return to ED if symptoms worsen today.   , PA-C  12/18/2020 9:37 AM

## 2021-01-10 ENCOUNTER — Other Ambulatory Visit: Payer: Self-pay

## 2021-01-10 DIAGNOSIS — R102 Pelvic and perineal pain: Secondary | ICD-10-CM

## 2021-01-17 ENCOUNTER — Ambulatory Visit (INDEPENDENT_AMBULATORY_CARE_PROVIDER_SITE_OTHER): Payer: 59 | Admitting: Obstetrics and Gynecology

## 2021-01-17 ENCOUNTER — Other Ambulatory Visit: Payer: Self-pay

## 2021-01-17 ENCOUNTER — Ambulatory Visit (INDEPENDENT_AMBULATORY_CARE_PROVIDER_SITE_OTHER): Payer: 59

## 2021-01-17 ENCOUNTER — Encounter: Payer: Self-pay | Admitting: Obstetrics and Gynecology

## 2021-01-17 VITALS — BP 130/72 | HR 96 | Ht 65.0 in | Wt 247.0 lb

## 2021-01-17 DIAGNOSIS — R102 Pelvic and perineal pain: Secondary | ICD-10-CM | POA: Diagnosis not present

## 2021-01-17 DIAGNOSIS — N83202 Unspecified ovarian cyst, left side: Secondary | ICD-10-CM

## 2021-01-17 DIAGNOSIS — I63212 Cerebral infarction due to unspecified occlusion or stenosis of left vertebral arteries: Secondary | ICD-10-CM | POA: Diagnosis not present

## 2021-01-17 DIAGNOSIS — N83201 Unspecified ovarian cyst, right side: Secondary | ICD-10-CM | POA: Diagnosis not present

## 2021-01-17 NOTE — Progress Notes (Signed)
GYNECOLOGY  VISIT   HPI: 40 y.o.   Married White or Caucasian Not Hispanic or Latino  female   [id number] with Patient's last menstrual period was 01/13/2021.   here for further evaluation of pelvic pain. She is on POP for heavy cycles and severe cramps, started in 1/22. Since starting POP, she gets intense cramping 1-2 weeks prior to her cycle (one time the same week as her cycle). The cramping lasts for one day, excruciating and constant. The pain feels like a combination of her IBS pain and her menstrual type pain. She takes ASA daily secondary to the stroke. Not taking Nsaids. Tried a tylenol x 1, helped moderately. She also got a low grade fever with the cramping.  Sexually active, long h/o deep, positional dyspareunia.   She has a normal BM qd with daily fiber. Normal colonoscopy in 10/21.    Last baby was born in 09/2015. Vasectomy for contraception. She is on trileptal for epilepsy (no sz for 10 years).   Records reviewed: h/o stroke 5 weeks PP Prior gyn suspected endometriosis  Ultrasound last year: 3.4 cm elongated cystic mass in left adnexa with echos ? hydrosalpinx right ovary displaced inferior to uterus.   11/28/20: CRP elevated at 16 ANA negative RA negative Sed rate 27   11/29/20: Negative stool for O&P   11/21/19: CT abdomen/pelvis: uterus and adnexa are unremarkable. Likely 3 cm left ovarian cyst. IMPRESSION: Normal appearing appendix.   No other acute intra-abdominal or pelvic pathology to explain the patient's symptoms.   Probable 3 cm left ovarian cyst.   06/06/20: Negative GC/CT     She was noted to have pelvic tenderness at her last visit and was given doxycycline for possible endometritis. She doesn't feel as uncomfortable since the doxycycline. The ultrasound today didn't hurt. She did have negative testing for GC/CT last month.   GYNECOLOGIC HISTORY: Patient's last menstrual period was 01/13/2021. 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)    SVD (spontaneous vaginal delivery) 10/06/2015   Postpartum care following vaginal delivery (4/13) 10/06/2015   Postpartum state 10/06/2015   Labor and delivery, indication for care 10/05/2015   Seizure disorder (HCC) 01/09/2011    Past Medical History:  Diagnosis Date   Anxiety    Depression    Endometriosis    Gestational diabetes    diet controlled   Gestational diabetes mellitus (GDM), antepartum    IBS (irritable bowel syndrome)    Migraines    Nocturnal epilepsy (HCC)    Postpartum care following vaginal delivery 01/09/2011   Seizure disorder (HCC) 01/09/2011   Seizures (HCC)  STD (sexually transmitted disease)    chlamydia treated   Stroke [facility])    Thalassemia    Thalassemia minor    Thalassemia trait    Vertigo 08/10/2019    Past Surgical History:  Procedure Laterality Date   BACK SURGERY  08/2018   spine surgery   WISDOM TOOTH EXTRACTION  06/25/2000    Current Outpatient Medications  Medication Sig Dispense Refill   ASPIRIN 81 PO Take 81 mg by mouth daily.      Cholecalciferol (VITAMIN D) 2000 units tablet Take 2,000 Units by mouth at bedtime.     dicyclomine (BENTYL) 20 MG tablet Take 1 tablet (20 mg total) by mouth 2 (two) times daily. 20 tablet 0   doxycycline (VIBRAMYCIN) 100 MG capsule Take 1 capsule (100 mg total) by mouth 2 (two) times daily. Take BID for 10 days.  Take with  food as can cause GI distress. 20 capsule 0   FOLIC ACID PO Take 4 mg by mouth.     hydrOXYzine (ATARAX/VISTARIL) 10 MG tablet Take 10-20 mg by mouth 3 (three) times daily as needed.     INCASSIA 0.35 MG tablet Take 1 tablet by mouth at bedtime.     lactobacillus acidophilus (BACID) TABS tablet Take 1 tablet by mouth daily.     Multiple Vitamin (MULTIVITAMIN) tablet Take 1 tablet by mouth daily.     Omega-3 Fatty Acids (OMEGA 3 PO) Take by mouth.     Oxcarbazepine (TRILEPTAL) 300 MG tablet Take 300 mg by mouth 2 (two) times daily.      No current facility-administered medications for this visit.     ALLERGIES: Codeine, Vancomycin, Escitalopram, Penicillins, Sulfa antibiotics, Erythromycin base, and Cefdinir  Family History  Problem Relation Age of Onset   Diabetes Mother    Hypertension Mother    Prostate cancer Father    Diabetes Sister    Lupus Sister    Squamous cell carcinoma Sister    Diabetes Sister    Endometriosis Sister    Bipolar disorder Sister    Diabetes Sister    Fibromyalgia Sister    Endometriosis Sister    Hypertension Sister    Deep vein thrombosis Sister    Breast cancer Maternal Grandmother    Diabetes Maternal Grandmother    Hypertension Maternal Grandmother    Lung cancer Maternal Grandmother    Prostate cancer Maternal Grandfather    Squamous cell carcinoma Maternal Grandfather     Social History   Socioeconomic History   Marital status: Married    Spouse name: Not on file   Number of children: Not on file   Years of education: Not on file   Highest education level: Not on file  Occupational History   Not on file  Tobacco Use   Smoking status: Former    Types: Cigarettes    Quit date: 07/09/2005    Years since quitting: 15.5   Smokeless tobacco: Never  Substance and Sexual Activity   Alcohol use: No   Drug use: No   Sexual activity: Yes    Partners: Male    Birth control/protection: Other-see comments, Pill    Comment: husband  vasectomy  Other Topics Concern   Not on file  Social History Narrative   Not on file   Social Determinants of Health   Financial Resource Strain: Not on file  Food Insecurity: Not on file  Transportation Needs: Not on file  Physical Activity: Not on file  Stress: Not on file  Social  Connections: Not on file  Intimate Partner Violence: Not on file    ROS  PHYSICAL EXAMINATION:    BP 130/72   Pulse 96   Ht 5\' 5"  (1.651 m)   Wt 247 lb (112 kg)   LMP 01/13/2021   SpO2 98%   BMI 41.10 kg/m     General appearance: alert, cooperative and appears stated age   Pelvic ultrasound  Indications: Pelvic pain and tenderness  Findings:  Anteverted Uterus 9.03 x 5.42 x 4.76 cm  Endometrium 7.9 mm, symmetrical, no masses  Left ovary 5.64 x 4.47 x 4.88 cm  1) 3.1 x 2.26 cm echogenic, avascular cyst  2) 4.45 x 2.42 cm mostly simple, avascular cyst  Right ovary 5.63 x 5.02 x 4.06 cm  4.56 x 3.60 cm echogenic, avascular cyst  The right ovary appears stuck to the posterior uterus  No free fluid   Impression:  Normal sized anteverted uterus No myometrial masses Normal endometrium Bilateral complex appearing, echogenic ovarian cysts. Concerning for possible endometrioma's Right ovary appears adherent to the posterior uterus, concerning for endometriosis  1. Pelvic pain Ultrasound with bilateral ovarian cysts concerning for possible endometriomas. These were not seen on ultrasound last year.  6 month h/o severe pelvic pain 1-2 weeks prior to her cycle, started while on POP. She is not a candidate for OCP's or depo-provera. She does feel a little better since she was treated for a possible endometritis last month. She didn't have the severe midcycle pelvic pain this month. The ultrasound exam didn't hurt today (it did previously) -H/O IBS, negative colonoscopy -Recommended that we repeat the ultrasound in 2 months -We discussed possible treatment with higher dose progesterone,  orilissa or lupron. We could treat her with aygestin 5-15 mg daily. The lower dose of orilissa could be used for 24 months, higher dose of orilissa could be used for 6 months, lupron could be used for up to 12 months with add back aygestin. -We discussed the option of surgery, this could be a diagnostic laparoscopy with treatment of endometriosis, +/- mirena IUD insertion. Hysterectomy is also an option. She is not a candidate for estrogen replacement, so would want to leave at least one ovary.  -She has an appointment to see a Rheumatologist next month, being evaluated for possible autoimmune disorders -Will f/u after her next ultrasound and make further plans.  -We discussed the option of a small prescription of narcotics in case she develops the severe pain again after hours, she declines -If her pain gets very severe, she will go to the [facility] where 24 hour ultrasound is available. It would be helpful to see if there are any ultrasound findings that correlate with her pain  2. Bilateral ovarian cysts See above F/U ultrasound in 2 months  3. 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

## 2021-01-18 ENCOUNTER — Telehealth: Payer: Self-pay | Admitting: Obstetrics and Gynecology

## 2021-01-18 ENCOUNTER — Encounter: Payer: Self-pay | Admitting: Obstetrics and Gynecology

## 2021-01-18 NOTE — Telephone Encounter (Signed)
Mychart message sent.

## 2021-01-18 NOTE — Patient Instructions (Signed)
Endometriosis  Endometriosis is a condition in which a tissue similar to the endometrium grows in places outside the uterus. The endometrium is a tissue that forms the lining of the uterus. This tissue can grow in the organs that create the eggs (ovaries), the tubes that carry the eggs to the uterus (fallopian tubes), the vagina, and the bowel. This tissue most often grows on the ovaries and inner lining of the pelvic cavity (peritoneum). When the uterus sheds the endometrium every menstrual cycle, there is bleeding wherever these types of tissue are located. This can cause pain because blood is irritating to tissues that are not normally exposed to it. Endometriosis canalso make it harder for a woman to get pregnant. What are the causes? The cause of this condition is not known. What increases the risk? The following factors may make you more likely to develop this condition: Having a family history of endometriosis. Having never given birth. Starting your period at 10 years of age or younger. What are the signs or symptoms? Often, there are no symptoms of this condition. If you do have symptoms, they may: Vary depending on where the abnormal tissue is growing. Occur during your menstrual period (most often) or at the middle of your cycle. Come and go. You may have no symptoms during some months. Stop when you no longer have your monthly periods (menopause). Symptoms may include: Pain in the area between your hip bones (pelvis). Heavier bleeding during periods. Menstrual periods that happen more than once a month. Pain during sex. Pain in the back or abdomen. Painful bowel movements. Not being able to get pregnant. How is this diagnosed? This condition is diagnosed based on your symptoms and a physical exam. You may have tests, such as: Blood tests and urine tests to help rule out other causes. Ultrasound to look for tissues that are not normal. This is often done over your skin. It is  sometimes done through the vagina (transvaginal). X-ray of the lower bowel (barium enema). CT scan. MRI. To confirm the diagnosis, your health care provider may use a device with a small camera to check tissue inside your abdomen (laparoscopy). Abnormal tissue may be removed and checked in a lab (biopsy). How is this treated? There is no cure for this condition. Treatment focuses on controlling your symptoms. The type of treatment also depends on whether you want to become pregnant in the future. This condition may be treated with: Medicines. These may include: Medicines to relieve pain, including NSAIDs such as ibuprofen. Hormone therapy. This uses artificial hormones to slow the growth of the abnormal tissue. This may include hormonal birth control, such as pills. Surgery to remove the abnormal tissue. During surgery: Tissue may be removed using a laparoscope and a laser (laparoscopic laser treatment). The fallopian tubes, uterus, and ovaries may be removed (hysterectomy). This is done in very severe cases. Follow these instructions at home: Get regular exercise. Limit alcohol use. Eat a balanced diet. Avoid caffeine. Take over-the-counter and prescription medicines only as told by your health care provider. Keep all follow-up visits as told by your health care provider. This is important. Where to find more information American College of Obstetricians and Gynecologists: https://www.acog.org/ Office on Women's Health: https://www.womenshealth.gov/ Contact a health care provider if: You are having new pain or trouble controlling pain. You have problems getting pregnant. You have a fever. Get help right away if you have: Severe pain that does not get better with medicine. Severe nausea and vomiting, or   if you cannot eat or drink without vomiting. Pain that affects your abdomen only on the lower, right side. Pain in your abdomen that gets worse. Swelling in your abdomen. Blood in  your stool (feces). Summary Endometriosis is a condition in which a tissue similar to the endometrium grows in places outside the uterus. The endometrium is a tissue that forms the lining of the uterus. The cause of this condition is not known. This condition may be treated with medicines to relieve pain, hormone therapy, or surgery. If you have this condition, get regular exercise, limit alcohol use, and avoid caffeine. Get help right away if you have severe pain that does not get better with medicine, or if you have severe nausea and vomiting or blood in your stool. This information is not intended to replace advice given to you by your health care provider. Make sure you discuss any questions you have with your healthcare provider. Document Revised: 07/29/2019 Document Reviewed: 07/29/2019 Elsevier Patient Education  2021 Elsevier Inc.  

## 2021-01-31 LAB — SURESWAB CT/NG/T. VAGINALIS
C. trachomatis RNA, TMA: NOT DETECTED
N. gonorrhoeae RNA, TMA: NOT DETECTED
Trichomonas vaginalis RNA: NOT DETECTED

## 2021-02-09 DIAGNOSIS — F410 Panic disorder [episodic paroxysmal anxiety] without agoraphobia: Secondary | ICD-10-CM | POA: Insufficient documentation

## 2021-02-09 DIAGNOSIS — Z8489 Family history of other specified conditions: Secondary | ICD-10-CM | POA: Insufficient documentation

## 2021-02-09 DIAGNOSIS — F331 Major depressive disorder, recurrent, moderate: Secondary | ICD-10-CM | POA: Insufficient documentation

## 2021-03-09 ENCOUNTER — Other Ambulatory Visit: Payer: Self-pay | Admitting: *Deleted

## 2021-03-09 DIAGNOSIS — N83201 Unspecified ovarian cyst, right side: Secondary | ICD-10-CM

## 2021-03-23 ENCOUNTER — Other Ambulatory Visit: Payer: Self-pay

## 2021-03-23 ENCOUNTER — Ambulatory Visit (INDEPENDENT_AMBULATORY_CARE_PROVIDER_SITE_OTHER): Payer: 59 | Admitting: Obstetrics and Gynecology

## 2021-03-23 ENCOUNTER — Ambulatory Visit (INDEPENDENT_AMBULATORY_CARE_PROVIDER_SITE_OTHER): Payer: 59

## 2021-03-23 ENCOUNTER — Encounter: Payer: Self-pay | Admitting: Obstetrics and Gynecology

## 2021-03-23 ENCOUNTER — Other Ambulatory Visit: Payer: 59

## 2021-03-23 VITALS — BP 118/80 | HR 88 | Ht 65.0 in | Wt 247.0 lb

## 2021-03-23 DIAGNOSIS — Z3041 Encounter for surveillance of contraceptive pills: Secondary | ICD-10-CM

## 2021-03-23 DIAGNOSIS — N83202 Unspecified ovarian cyst, left side: Secondary | ICD-10-CM

## 2021-03-23 DIAGNOSIS — N83201 Unspecified ovarian cyst, right side: Secondary | ICD-10-CM | POA: Diagnosis not present

## 2021-03-23 DIAGNOSIS — R102 Pelvic and perineal pain: Secondary | ICD-10-CM

## 2021-03-23 MED ORDER — INCASSIA 0.35 MG PO TABS: 1.0000 | ORAL_TABLET | Freq: Every day | ORAL | 2 refills | Status: DC

## 2021-03-23 NOTE — Progress Notes (Signed)
GYNECOLOGY  VISIT   HPI: 40 y.o.   Married White or Caucasian Not Hispanic or Latino  female   [id number] with No LMP recorded.   here for a f/u ultrasound for pelvic pain and a h/o bilateral ovarian cysts.  She is on POP. Not a candidate for OCP's or depo provera. Her pelvic pain improved after treatment with doxycyline for a possible endometriosis.   She started on POP's in January, initially cycles were monthly. From 3/22-7/22 cycles were every 3 weeks. Then went 2 months between cycles. No cramps with her cycle. Sexually active, some bladder pain with intercourse, positional.   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 Doing conservative therapy.  h/o stroke 5 weeks PP Prior gyn suspected endometriosis  Ultrasound last year: 3.4 cm elongated cystic mass in left adnexa with echos ? hydrosalpinx right ovary displaced inferior to uterus.  12/21: negative GC/CT  GYNECOLOGIC HISTORY: No LMP recorded. 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)    SVD (spontaneous vaginal delivery) 10/06/2015   Postpartum care following vaginal delivery (4/13) 10/06/2015   Postpartum state 10/06/2015   Labor and delivery, indication for care 10/05/2015   Seizure disorder (HCC) 01/09/2011    Past Medical History:  Diagnosis Date   Anxiety    Depression    Endometriosis    Gestational  diabetes    diet controlled   Gestational diabetes mellitus (GDM), antepartum    IBS (irritable bowel syndrome)    Migraines    Nocturnal epilepsy (HCC)    Postpartum care following vaginal delivery 01/09/2011   Seizure disorder (HCC) 01/09/2011   Seizures (HCC)    STD (sexually transmitted disease)    chlamydia treated   Stroke (HCC)    Thalassemia    Thalassemia minor    Thalassemia trait    Vertigo 08/10/2019    Past Surgical History:  Procedure Laterality Date   BACK SURGERY  08/2018   spine surgery   WISDOM TOOTH EXTRACTION  06/25/2000    Current Outpatient Medications  Medication Sig Dispense Refill   ASPIRIN 81 PO Take 81 mg by mouth daily.      Cholecalciferol (VITAMIN D) 2000 units tablet Take 2,000 Units by mouth at bedtime.     dicyclomine (BENTYL) 20 MG tablet Take 1 tablet (20 mg total) by mouth 2 (two) times daily. 20 tablet 0   doxycycline (VIBRAMYCIN) 100 MG capsule Take 1 capsule (100 mg total) by mouth 2 (two) times daily. Take BID for 10 days.  Take with food as can cause GI distress. 20 capsule 0   FOLIC ACID PO  Take 4 mg by mouth.     hydrOXYzine (ATARAX/VISTARIL) 10 MG tablet Take 10-20 mg by mouth 3 (three) times daily as needed.     INCASSIA 0.35 MG tablet Take 1 tablet by mouth at bedtime.     lactobacillus acidophilus (BACID) TABS tablet Take 1 tablet by mouth daily.     Multiple Vitamin (MULTIVITAMIN) tablet Take 1 tablet by mouth daily.     Omega-3 Fatty Acids (OMEGA 3 PO) Take by mouth.     Oxcarbazepine (TRILEPTAL) 300 MG tablet Take 300 mg by mouth 2 (two) times daily.      No current facility-administered medications for this visit.     ALLERGIES: Codeine, Vancomycin, Escitalopram, Penicillins, Sulfa antibiotics, Erythromycin base, and Cefdinir  Family History  Problem Relation Age of Onset   Diabetes Mother    Hypertension Mother    Prostate cancer Father    Diabetes Sister    Lupus Sister    Squamous cell carcinoma Sister     Diabetes Sister    Endometriosis Sister    Bipolar disorder Sister    Diabetes Sister    Fibromyalgia Sister    Endometriosis Sister    Hypertension Sister    Deep vein thrombosis Sister    Breast cancer Maternal Grandmother    Diabetes Maternal Grandmother    Hypertension Maternal Grandmother    Lung cancer Maternal Grandmother    Prostate cancer Maternal Grandfather    Squamous cell carcinoma Maternal Grandfather     Social History   Socioeconomic History   Marital status: Married    Spouse name: Not on file   Number of children: Not on file   Years of education: Not on file   Highest education level: Not on file  Occupational History   Not on file  Tobacco Use   Smoking status: Former    Types: Cigarettes    Quit date: 07/09/2005    Years since quitting: 15.7   Smokeless tobacco: Never  Substance and Sexual Activity   Alcohol use: No   Drug use: No   Sexual activity: Yes    Partners: Male    Birth control/protection: Other-see comments, Pill    Comment: husband vasectomy  Other Topics Concern   Not on file  Social History Narrative   Not on file   Social Determinants of Health   Financial Resource Strain: Not on file  Food Insecurity: Not on file  Transportation Needs: Not on file  Physical Activity: Not on file  Stress: Not on file  Social Connections: Not on file  Intimate Partner Violence: Not on file    ROS  PHYSICAL EXAMINATION:    There were no vitals taken for this visit.    General appearance: alert, cooperative and appears stated age  Pelvic ultrasound  Indications: f/u bilateral ovarian cysts, pelvic pain  Findings:  Anteverted Uterus 9.07 x 6.0 x 4.59 cm  Endometrium 7.6 mm, symmetrical   Left ovary 4.8 x 4.6 cm  Echogenic, avascular cyst, 4.54 x 4.06 cm, fluid layering.  Prior 3 cm cyst has resolved.  Moves independent of the uterus  Right ovary 6.15 x 4.37 cm  Persistent avascular, echogenic cyst. Measures 5.22 x 3.81 cm.  Minimally larger than in 6/22.  Right ovary appears adherent to the posterior uterus  No free fluid  Impression:  Normal sized anteverted uterus Normal endometrium Bilateral echogenic, avascular ovarian cysts The right ovarian cyst is minimally larger than in 6/22, appearance of an endometrioma, adherent to the  posterior uterus The left ovary has a fluid layering cyst, unclear if this is persistent from her prior U/S or a new cyst. It has a different appearance. Not adherent to surrounding structures. Differential diagnosis includes endometrioma and hemorrhagic CL   1. Bilateral ovarian cysts The cyst on the right ovary is c/w an endometrioma, it is adherent to the posterior uterus. The cyst on the left ovary has changed in appearance since her last ultrasound and may be a hemorrhagic CL vs endometrioma. -Discussed option of surveillance vs surgery -Discussed risk of torsion (only of left ovary), rupture, malignant transformation -Discussed options for surgery, including laparoscopy with cystectomies, possible removal of one ovary and treatment of endometriosis -For now she would like to continue with surveillance, she will return for an ultrasound in 6 months - US PELVIS TRANSVAGINAL NON-OB (TV ONLY); Future  2. Pelvic pain Improved since she was treated with antibiotics for suspected endometritis  3. Encounter for surveillance of contraceptive pills Doing well, will continue on POP.  - INCASSIA 0.35 MG tablet; Take 1 tablet (0.35 mg total) by mouth at bedtime.  Dispense: 84 tablet; Refill: 2  In addition to reviewing the ultrasound with the patient, over 20 minutes was spent in counseling about ovarian cysts, endometriosis, options for management and discussing POP's and cycles.

## 2021-03-23 NOTE — Patient Instructions (Signed)
Endometriosis  Endometriosis is a condition in which a tissue similar to the endometrium grows in places outside the uterus. The endometrium is a tissue that forms the lining of the uterus. This tissue can grow in the organs that create the eggs (ovaries), the tubes that carry the eggs to the uterus (fallopian tubes), the vagina, and the bowel. This tissue most often grows on the ovaries and inner lining of the pelvic cavity (peritoneum). When the uterus sheds the endometrium every menstrual cycle, there is bleeding wherever these types of tissue are located. This can cause pain because blood is irritating to tissues that are not normally exposed to it. Endometriosis canalso make it harder for a woman to get pregnant. What are the causes? The cause of this condition is not known. What increases the risk? The following factors may make you more likely to develop this condition: Having a family history of endometriosis. Having never given birth. Starting your period at 10 years of age or younger. What are the signs or symptoms? Often, there are no symptoms of this condition. If you do have symptoms, they may: Vary depending on where the abnormal tissue is growing. Occur during your menstrual period (most often) or at the middle of your cycle. Come and go. You may have no symptoms during some months. Stop when you no longer have your monthly periods (menopause). Symptoms may include: Pain in the area between your hip bones (pelvis). Heavier bleeding during periods. Menstrual periods that happen more than once a month. Pain during sex. Pain in the back or abdomen. Painful bowel movements. Not being able to get pregnant. How is this diagnosed? This condition is diagnosed based on your symptoms and a physical exam. You may have tests, such as: Blood tests and urine tests to help rule out other causes. Ultrasound to look for tissues that are not normal. This is often done over your skin. It is  sometimes done through the vagina (transvaginal). X-ray of the lower bowel (barium enema). CT scan. MRI. To confirm the diagnosis, your health care provider may use a device with a small camera to check tissue inside your abdomen (laparoscopy). Abnormal tissue may be removed and checked in a lab (biopsy). How is this treated? There is no cure for this condition. Treatment focuses on controlling your symptoms. The type of treatment also depends on whether you want to become pregnant in the future. This condition may be treated with: Medicines. These may include: Medicines to relieve pain, including NSAIDs such as ibuprofen. Hormone therapy. This uses artificial hormones to slow the growth of the abnormal tissue. This may include hormonal birth control, such as pills. Surgery to remove the abnormal tissue. During surgery: Tissue may be removed using a laparoscope and a laser (laparoscopic laser treatment). The fallopian tubes, uterus, and ovaries may be removed (hysterectomy). This is done in very severe cases. Follow these instructions at home: Get regular exercise. Limit alcohol use. Eat a balanced diet. Avoid caffeine. Take over-the-counter and prescription medicines only as told by your health care provider. Keep all follow-up visits as told by your health care provider. This is important. Where to find more information American College of Obstetricians and Gynecologists: https://www.acog.org/ Office on Women's Health: https://www.womenshealth.gov/ Contact a health care provider if: You are having new pain or trouble controlling pain. You have problems getting pregnant. You have a fever. Get help right away if you have: Severe pain that does not get better with medicine. Severe nausea and vomiting, or   if you cannot eat or drink without vomiting. Pain that affects your abdomen only on the lower, right side. Pain in your abdomen that gets worse. Swelling in your abdomen. Blood in  your stool (feces). Summary Endometriosis is a condition in which a tissue similar to the endometrium grows in places outside the uterus. The endometrium is a tissue that forms the lining of the uterus. The cause of this condition is not known. This condition may be treated with medicines to relieve pain, hormone therapy, or surgery. If you have this condition, get regular exercise, limit alcohol use, and avoid caffeine. Get help right away if you have severe pain that does not get better with medicine, or if you have severe nausea and vomiting or blood in your stool. This information is not intended to replace advice given to you by your health care provider. Make sure you discuss any questions you have with your healthcare provider. Document Revised: 07/29/2019 Document Reviewed: 07/29/2019 Elsevier Patient Education  2021 Elsevier Inc.  

## 2021-04-13 ENCOUNTER — Ambulatory Visit: Payer: 59 | Attending: Internal Medicine

## 2021-04-13 DIAGNOSIS — Z23 Encounter for immunization: Secondary | ICD-10-CM

## 2021-04-13 NOTE — Progress Notes (Signed)
   Covid-19 Vaccination [facility]  Name:  Marilyn Black    MRN: [id number] DOB: 16-Jan-1981  04/13/2021  [name] was observed post Covid-19 immunization for 15 minutes without incident. She was provided with Vaccine Information Sheet and instruction to access the V-Safe system.   [name] was instructed to call 911 with any severe reactions post vaccine: Difficulty breathing  Swelling of face and throat  A fast heartbeat  A bad rash all over body  Dizziness and weakness   Immunizations Administered     Name Date Dose VIS Date Route   Moderna Covid-19 vaccine Bivalent Booster 04/13/2021  1:12 PM 0.5 mL 02/04/2021 Intramuscular   Manufacturer: Moderna   Lot: [id number]   NDC: 35825-189-84

## 2021-04-25 ENCOUNTER — Other Ambulatory Visit (HOSPITAL_BASED_OUTPATIENT_CLINIC_OR_DEPARTMENT_OTHER): Payer: Self-pay

## 2021-04-25 MED ORDER — INFLUENZA VAC SPLIT QUAD 0.5 ML IM SUSY
PREFILLED_SYRINGE | INTRAMUSCULAR | 0 refills | Status: AC
  Filled 2021-04-25: qty 0.5, 1d supply, fill #0

## 2021-05-06 DIAGNOSIS — G4700 Insomnia, unspecified: Secondary | ICD-10-CM | POA: Insufficient documentation

## 2021-05-09 ENCOUNTER — Other Ambulatory Visit (HOSPITAL_BASED_OUTPATIENT_CLINIC_OR_DEPARTMENT_OTHER): Payer: Self-pay

## 2021-05-09 MED ORDER — MODERNA COVID-19 BIVAL BOOSTER 50 MCG/0.5ML IM SUSP
INTRAMUSCULAR | 0 refills | Status: AC
  Filled 2021-05-09: qty 0.5, 1d supply, fill #0

## 2021-06-13 DIAGNOSIS — H539 Unspecified visual disturbance: Secondary | ICD-10-CM | POA: Insufficient documentation

## 2021-08-16 ENCOUNTER — Other Ambulatory Visit: Payer: Self-pay

## 2021-08-16 ENCOUNTER — Encounter (HOSPITAL_BASED_OUTPATIENT_CLINIC_OR_DEPARTMENT_OTHER): Payer: Self-pay

## 2021-08-16 ENCOUNTER — Emergency Department (HOSPITAL_BASED_OUTPATIENT_CLINIC_OR_DEPARTMENT_OTHER)
Admission: EM | Admit: 2021-08-16 | Discharge: 2021-08-16 | Disposition: A | Discharge status: Home / Self Care | Payer: BC Managed Care – PPO | Attending: Emergency Medicine | Admitting: Emergency Medicine

## 2021-08-16 DIAGNOSIS — R110 Nausea: Secondary | ICD-10-CM | POA: Diagnosis not present

## 2021-08-16 DIAGNOSIS — Z20822 Contact with and (suspected) exposure to covid-19: Secondary | ICD-10-CM | POA: Diagnosis not present

## 2021-08-16 DIAGNOSIS — M25519 Pain in unspecified shoulder: Secondary | ICD-10-CM | POA: Diagnosis not present

## 2021-08-16 DIAGNOSIS — M542 Cervicalgia: Secondary | ICD-10-CM | POA: Diagnosis not present

## 2021-08-16 DIAGNOSIS — R112 Nausea with vomiting, unspecified: Secondary | ICD-10-CM | POA: Insufficient documentation

## 2021-08-16 DIAGNOSIS — Z7982 Long term (current) use of aspirin: Secondary | ICD-10-CM | POA: Diagnosis not present

## 2021-08-16 LAB — CBC WITH DIFFERENTIAL/PLATELET
Abs Immature Granulocytes: 0.03 10*3/uL (ref 0.00–0.07)
Basophils Absolute: 0 10*3/uL (ref 0.0–0.1)
Basophils Relative: 1 %
Eosinophils Absolute: 0.2 10*3/uL (ref 0.0–0.5)
Eosinophils Relative: 2 %
HCT: 36.7 % (ref 36.0–46.0)
Hemoglobin: 11.5 g/dL — ABNORMAL LOW (ref 12.0–15.0)
Immature Granulocytes: 0 %
Lymphocytes Relative: 24 %
Lymphs Abs: 2 10*3/uL (ref 0.7–4.0)
MCH: 20.3 pg — ABNORMAL LOW (ref 26.0–34.0)
MCHC: 31.3 g/dL (ref 30.0–36.0)
MCV: 64.8 fL — ABNORMAL LOW (ref 80.0–100.0)
Monocytes Absolute: 0.4 10*3/uL (ref 0.1–1.0)
Monocytes Relative: 5 %
Neutro Abs: 5.7 10*3/uL (ref 1.7–7.7)
Neutrophils Relative %: 68 %
Platelets: 205 10*3/uL (ref 150–400)
RBC: 5.66 MIL/uL — ABNORMAL HIGH (ref 3.87–5.11)
RDW: 15.4 % (ref 11.5–15.5)
WBC: 8.4 10*3/uL (ref 4.0–10.5)
nRBC: 0 % (ref 0.0–0.2)

## 2021-08-16 LAB — COMPREHENSIVE METABOLIC PANEL
ALT: 18 U/L (ref 0–44)
AST: 16 U/L (ref 15–41)
Albumin: 4.5 g/dL (ref 3.5–5.0)
Alkaline Phosphatase: 63 U/L (ref 38–126)
Anion gap: 7 (ref 5–15)
BUN: 16 mg/dL (ref 6–20)
CO2: 23 mmol/L (ref 22–32)
Calcium: 9.2 mg/dL (ref 8.9–10.3)
Chloride: 105 mmol/L (ref 98–111)
Creatinine, Ser: 0.71 mg/dL (ref 0.44–1.00)
GFR, Estimated: >60 mL/min (ref >60)
Glucose, Bld: 111 mg/dL — ABNORMAL HIGH (ref 70–99)
Potassium: 3.9 mmol/L (ref 3.5–5.1)
Sodium: 135 mmol/L (ref 135–145)
Total Bilirubin: 0.7 mg/dL (ref 0.3–1.2)
Total Protein: 7.4 g/dL (ref 6.5–8.1)

## 2021-08-16 LAB — RESP PANEL BY RT-PCR (FLU A&B, COVID) ARPGX2
Influenza A by PCR: NEGATIVE
Influenza B by PCR: NEGATIVE
SARS Coronavirus 2 by RT PCR: NEGATIVE

## 2021-08-16 MED ORDER — ONDANSETRON 4 MG PO TBDP
4.0000 mg | ORAL_TABLET | Freq: Once | ORAL | Status: AC
  Administered 2021-08-16: 4 mg via ORAL
  Filled 2021-08-16: qty 1

## 2021-08-16 MED ORDER — ONDANSETRON HCL 4 MG PO TABS: 4.0000 mg | ORAL_TABLET | Freq: Four times a day (QID) | ORAL | 0 refills | Status: AC

## 2021-08-16 NOTE — ED Provider Notes (Signed)
[facility] EMERGENCY DEPARTMENT Provider Note   CSN: [id number] Arrival date & time: 08/16/21  1343     History  Chief Complaint  Patient presents with   Neck Pain    Marilyn Black is a 41 y.o. female with a past medical history of seizure disorder and CVA 2017 presenting today with complaint of neck pain.  She reports that she was at work and feeling fine however she all of a sudden started to feel warm all over and had accompanying bilateral neck and shoulder pain.  She described it as feeling like "when you sleep funny and wake up with soreness."  Additionally, she became nauseated and decided to come to the emergency department.  Has not been sick recently however reports that her family friends and neighbors have all had a stomach bug.  She went to spend time with them at their home yesterday however they stated that they were no longer experiencing symptoms.  She denies any photophobia, blurred vision or headaches.      Home Medications Prior to Admission medications   Medication Sig Start Date End Date Taking? Authorizing Provider  ASPIRIN 81 PO Take 81 mg by mouth daily.     [provider]  Cholecalciferol (VITAMIN D) 2000 units tablet Take 2,000 Units by mouth at bedtime.    [provider]  COVID-19 mRNA bivalent vaccine, Moderna, (MODERNA COVID-19 BIVAL BOOSTER) 50 MCG/0.5ML injection Inject into the muscle. 04/19/21   Carlyle Basques, MD  dicyclomine (BENTYL) 20 MG tablet Take 1 tablet (20 mg total) by mouth 2 (two) times daily. 11/21/19   Malvin Johns, MD  FOLIC ACID PO Take 4 mg by mouth.    [provider]  hydrOXYzine (ATARAX/VISTARIL) 10 MG tablet Take 10-20 mg by mouth 3 (three) times daily as needed. 11/28/20   [provider]  INCASSIA 0.35 MG tablet Take 1 tablet (0.35 mg total) by mouth at bedtime. 03/23/21   Salvadore Dom, MD  influenza vac split quadrivalent PF (FLUARIX) 0.5 ML injection Inject into the  muscle. 04/25/21   Carlyle Basques, MD  lactobacillus acidophilus (BACID) TABS tablet Take 1 tablet by mouth daily.    [provider]  Multiple Vitamin (MULTIVITAMIN) tablet Take 1 tablet by mouth daily.    [provider]  Omega-3 Fatty Acids (OMEGA 3 PO) Take by mouth.    [provider]  Oxcarbazepine (TRILEPTAL) 300 MG tablet Take 300 mg by mouth 2 (two) times daily.     [provider]      Allergies    Codeine, Vancomycin, Escitalopram, Penicillins, Sulfa antibiotics, Erythromycin base, and Cefdinir    Review of Systems   Review of Systems  Constitutional:  Negative for fever.  Eyes:  Negative for photophobia.  Musculoskeletal:  Positive for neck pain.  Neurological:  Negative for headaches.  See HPI for further ROS  Physical Exam Updated Vital Signs BP 120/84 (BP Location: Left Arm)    Pulse 99    Temp 97.8 F (36.6 C) (Oral)    Resp 18    Ht 5\' 5"  (1.651 m)    Wt 112.9 kg    LMP 08/07/2021    SpO2 99%    BMI 41.44 kg/m  Physical Exam Vitals and nursing note reviewed.  Constitutional:      General: She is not in acute distress.    Appearance: Normal appearance. She is not ill-appearing.  HENT:     Head: Normocephalic and atraumatic.  Eyes:  General: No scleral icterus.    Conjunctiva/sclera: Conjunctivae normal.  Pulmonary:     Effort: Pulmonary effort is normal. No respiratory distress.  Musculoskeletal:     Cervical back: Normal range of motion. Tenderness (trapezius) present. No rigidity.  Skin:    Findings: No rash.  Neurological:     Mental Status: She is alert.  Psychiatric:        Mood and Affect: Mood normal.     Comments: Very anxious    ED Results / Procedures / Treatments   Labs (all labs ordered are listed, but only abnormal results are displayed) Labs Reviewed  CBC WITH DIFFERENTIAL/PLATELET - Abnormal; Notable for the following components:      Result Value   RBC 5.66 (*)    Hemoglobin 11.5 (*)    MCV  64.8 (*)    MCH 20.3 (*)    All other components within normal limits  COMPREHENSIVE METABOLIC PANEL - Abnormal; Notable for the following components:   Glucose, Bld 111 (*)    All other components within normal limits  RESP PANEL BY RT-PCR (FLU A&B, COVID) ARPGX2    EKG None  Radiology No results found.  Procedures Procedures    Medications Ordered in ED Medications  ondansetron (ZOFRAN-ODT) disintegrating tablet 4 mg (has no administration in time range)    ED Course/ Medical Decision Making/ A&P                           Medical Decision Making Amount and/or Complexity of Data Reviewed Labs: ordered.  Risk Prescription drug management.   41 year old female with a past medical history of CVA presenting today with neck pain, nausea and vomiting.  Differential includes but is not limited to meningitis, lyme dz, muscle strain, viral illness, CVA, ACS.  Work-up: Basic labs were ordered and patient was without abnormalities.  No leukocytosis, lower suspicion for infectious etiology.  Viral swabbing reviewed by me, negative for COVID and flu.  Physical exam: Patient able to fully manipulate her neck.  No meningeal signs.  No photophobia.  Inconsistent presentation for meningitis.  RRR, lungs clear.  Full neuro exam was performed without abnormalities.  Low suspicion CVA at this time  MDM/dispo: Patient was discussed with Dr. Ronnald Nian.  Her vital signs remained stable.  She is not febrile and without a white count.  She is able to move all of her extremities and is rotating her neck without difficulty.  She did have some nausea for which she was treated with Zofran and stated that her symptoms resolved.  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  At discharge she reported that she was on a computer that multiple kids accidentally threw up one yesterday.  She will be  discharged home with Zofran and strict return precautions.  She has been instructed to follow-up with her primary care provider and given a work note as well.  She is agreeable to this plan.  Final Clinical Impression(s) / ED Diagnoses Final diagnoses:  Neck pain  Nausea and vomiting, unspecified vomiting type    Rx / DC Orders ED Discharge Orders          Ordered    ondansetron (ZOFRAN) 4 MG tablet  Every 6 hours        08/16/21 1503  Results and diagnoses were explained to the patient. Return precautions discussed in full. Patient had no additional questions and expressed complete understanding.   This chart was dictated using voice recognition software.  Despite best efforts to proofread,  errors can occur which can change the documentation meaning.    Rhae Hammock, PA-C 08/16/21 [place], [place], DO 08/17/21 2159

## 2021-08-16 NOTE — ED Triage Notes (Signed)
Pt c/o "stiff neck-feeling feverish-nausea" x 2 hours-NAD-steady gait

## 2021-08-16 NOTE — Discharge Instructions (Signed)
The Zofran has been sent to your pharmacy.  There is also a work note attached to these discharge papers.  Please return with any worsening of your symptoms.  Especially if you develop problems with your vision, dizziness, numbness or weakness of your extremities or difficulty speaking.  Also return if you begin to have dark or bloody stools or throw up any blood.

## 2021-09-21 ENCOUNTER — Other Ambulatory Visit: Payer: 59

## 2021-10-10 ENCOUNTER — Other Ambulatory Visit: Payer: 59

## 2021-10-17 ENCOUNTER — Ambulatory Visit: Payer: BC Managed Care – PPO

## 2021-10-17 DIAGNOSIS — N83201 Unspecified ovarian cyst, right side: Secondary | ICD-10-CM

## 2021-10-17 DIAGNOSIS — N83202 Unspecified ovarian cyst, left side: Secondary | ICD-10-CM | POA: Diagnosis not present

## 2021-10-19 ENCOUNTER — Telehealth: Payer: Self-pay | Admitting: Obstetrics and Gynecology

## 2021-10-19 NOTE — Telephone Encounter (Signed)
Please let the patient know that her right ovarian cyst is slightly larger. Again has the appearance of bilateral endometriomas. They appear to be adherent to her uterus. Please ask about her pain and cycles currently. Her options are continued surveillance or surgery. I do think that her right ovary is slowly enlarging over time and at some point she is probably going to need surgery.  ?She has an annual exam scheduled in 6/23, we can discuss plans further then. She is welcome to come in for an appointment sooner if she wants to discuss things prior to then.  ?

## 2021-10-19 NOTE — Telephone Encounter (Signed)
LVMTCB

## 2021-10-24 NOTE — Telephone Encounter (Signed)
FYI. Pt states cycles are manageable for now but would like to make a sooner appt to discuss. Will send msg to scheduling.  ?

## 2021-11-07 ENCOUNTER — Other Ambulatory Visit: Payer: Self-pay

## 2021-11-07 ENCOUNTER — Emergency Department (HOSPITAL_COMMUNITY): Payer: BC Managed Care – PPO

## 2021-11-07 ENCOUNTER — Emergency Department (HOSPITAL_COMMUNITY)
Admission: EM | Admit: 2021-11-07 | Discharge: 2021-11-07 | Disposition: A | Discharge status: Home / Self Care | Payer: BC Managed Care – PPO | Attending: Emergency Medicine | Admitting: Emergency Medicine

## 2021-11-07 ENCOUNTER — Encounter (HOSPITAL_COMMUNITY): Payer: Self-pay

## 2021-11-07 DIAGNOSIS — Z7982 Long term (current) use of aspirin: Secondary | ICD-10-CM | POA: Insufficient documentation

## 2021-11-07 DIAGNOSIS — R519 Headache, unspecified: Secondary | ICD-10-CM | POA: Insufficient documentation

## 2021-11-07 DIAGNOSIS — R42 Dizziness and giddiness: Secondary | ICD-10-CM | POA: Diagnosis present

## 2021-11-07 LAB — I-STAT BETA HCG BLOOD, ED (MC, WL, AP ONLY): I-stat hCG, quantitative: <5 m[IU]/mL (ref <5)

## 2021-11-07 LAB — CBC WITH DIFFERENTIAL/PLATELET
Abs Immature Granulocytes: 0.04 10*3/uL (ref 0.00–0.07)
Basophils Absolute: 0 10*3/uL (ref 0.0–0.1)
Basophils Relative: 1 %
Eosinophils Absolute: 0.1 10*3/uL (ref 0.0–0.5)
Eosinophils Relative: 2 %
HCT: 37 % (ref 36.0–46.0)
Hemoglobin: 11.6 g/dL — ABNORMAL LOW (ref 12.0–15.0)
Immature Granulocytes: 1 %
Lymphocytes Relative: 23 %
Lymphs Abs: 2 10*3/uL (ref 0.7–4.0)
MCH: 20.7 pg — ABNORMAL LOW (ref 26.0–34.0)
MCHC: 31.4 g/dL (ref 30.0–36.0)
MCV: 66 fL — ABNORMAL LOW (ref 80.0–100.0)
Monocytes Absolute: 0.4 10*3/uL (ref 0.1–1.0)
Monocytes Relative: 4 %
Neutro Abs: 6.1 10*3/uL (ref 1.7–7.7)
Neutrophils Relative %: 69 %
Platelets: 206 10*3/uL (ref 150–400)
RBC: 5.61 MIL/uL — ABNORMAL HIGH (ref 3.87–5.11)
RDW: 14.7 % (ref 11.5–15.5)
WBC: 8.6 10*3/uL (ref 4.0–10.5)
nRBC: 0 % (ref 0.0–0.2)

## 2021-11-07 LAB — BASIC METABOLIC PANEL
Anion gap: 8 (ref 5–15)
BUN: 17 mg/dL (ref 6–20)
CO2: 22 mmol/L (ref 22–32)
Calcium: 9.5 mg/dL (ref 8.9–10.3)
Chloride: 109 mmol/L (ref 98–111)
Creatinine, Ser: 0.82 mg/dL (ref 0.44–1.00)
GFR, Estimated: >60 mL/min (ref >60)
Glucose, Bld: 105 mg/dL — ABNORMAL HIGH (ref 70–99)
Potassium: 3.9 mmol/L (ref 3.5–5.1)
Sodium: 139 mmol/L (ref 135–145)

## 2021-11-07 MED ORDER — SODIUM CHLORIDE 0.9 % IV BOLUS
500.0000 mL | Freq: Once | INTRAVENOUS | Status: AC
  Administered 2021-11-07: 500 mL via INTRAVENOUS

## 2021-11-07 MED ORDER — HYDROCODONE-ACETAMINOPHEN 5-325 MG PO TABS
2.0000 | ORAL_TABLET | Freq: Once | ORAL | Status: AC
  Administered 2021-11-07: 2 via ORAL
  Filled 2021-11-07: qty 2

## 2021-11-07 MED ORDER — ONDANSETRON 4 MG PO TBDP: 4.0000 mg | ORAL_TABLET | ORAL | 0 refills | Status: DC | PRN

## 2021-11-07 MED ORDER — ONDANSETRON HCL 4 MG/2ML IJ SOLN
4.0000 mg | Freq: Once | INTRAMUSCULAR | Status: AC
Start: 2021-11-07 — End: 2021-11-07
  Administered 2021-11-07: 4 mg via INTRAVENOUS
  Filled 2021-11-07: qty 2

## 2021-11-07 MED ORDER — IOHEXOL 350 MG/ML SOLN
75.0000 mL | Freq: Once | INTRAVENOUS | Status: AC | PRN
  Administered 2021-11-07: 75 mL via INTRAVENOUS

## 2021-11-07 MED ORDER — DIAZEPAM 5 MG PO TABS: 5.0000 mg | ORAL_TABLET | Freq: Three times a day (TID) | ORAL | 0 refills | Status: DC | PRN

## 2021-11-07 MED ORDER — DIAZEPAM 5 MG/ML IJ SOLN
5.0000 mg | Freq: Once | INTRAMUSCULAR | Status: AC
  Administered 2021-11-07: 5 mg via INTRAVENOUS
  Filled 2021-11-07: qty 2

## 2021-11-07 NOTE — ED Provider Notes (Signed)
?[name] ?Provider Note ? ? ?CSN: [id number] ?Arrival date & time: 11/07/21  1708 ? ?  ? ?History ? ?Chief Complaint  ?Patient presents with  ? Dizziness  ? ? ?Marilyn Black is a 41 y.o. female. ? ?HPI ?Patient has past medical history of vertebrobasilar dissection in 2017.  Patient reports that she was dizzy this morning upon awakening.  Symptoms were somewhat mild but then she went into the bathroom and everything went white in her vision and then she was extremely dizzy with the room spinning.  Symptoms have persisted throughout the day now.  She also has a frontal headache.  Patient reports symptoms are very similar to when she had her dissection.  No vomiting.  No double vision.  Patient reports just since getting to the bed in the emergency department she is noticing her left foot feels kind of tingly.  She is not sure if that is just positional.  No other areas of weakness numbness or tingling.  Has not been sick recently.  No fevers no chills no neck stiffness. ?  ? ?Home Medications ?Prior to Admission medications   ?Medication Sig Start Date End Date Taking? Authorizing Provider  ?diazepam (VALIUM) 5 MG tablet Take 1 tablet (5 mg total) by mouth every 8 (eight) hours as needed (vertigo). 11/07/21  Yes Arby BarrettePfeiffer, Cassia Fein, MD  ?ondansetron (ZOFRAN-ODT) 4 MG disintegrating tablet Take 1 tablet (4 mg total) by mouth every 4 (four) hours as needed for nausea or vomiting. 11/07/21  Yes Arby BarrettePfeiffer, Victor Granados, MD  ?ASPIRIN 81 PO Take 81 mg by mouth daily.     [provider]  ?Cholecalciferol (VITAMIN D) 2000 units tablet Take 2,000 Units by mouth at bedtime.    [provider]  ?COVID-19 mRNA bivalent vaccine, Moderna, (MODERNA COVID-19 BIVAL BOOSTER) 50 MCG/0.5ML injection Inject into the muscle. 04/19/21   Judyann MunsonSnider, Cynthia, MD  ?dicyclomine (BENTYL) 20 MG tablet Take 1 tablet (20 mg total) by mouth 2 (two) times daily. 11/21/19   Rolan BuccoBelfi, Melanie, MD  ?FOLIC ACID PO  Take 4 mg by mouth.    [provider]  ?hydrOXYzine (ATARAX/VISTARIL) 10 MG tablet Take 10-20 mg by mouth 3 (three) times daily as needed. 11/28/20   [provider]  ?INCASSIA 0.35 MG tablet Take 1 tablet (0.35 mg total) by mouth at bedtime. 03/23/21   Romualdo BolkJertson, Jill Evelyn, MD  ?influenza vac split quadrivalent PF (FLUARIX) 0.5 ML injection Inject into the muscle. 04/25/21   Judyann MunsonSnider, Cynthia, MD  ?lactobacillus acidophilus (BACID) TABS tablet Take 1 tablet by mouth daily.    [provider]  ?Multiple Vitamin (MULTIVITAMIN) tablet Take 1 tablet by mouth daily.    [provider]  ?Omega-3 Fatty Acids (OMEGA 3 PO) Take by mouth.    [provider]  ?Oxcarbazepine (TRILEPTAL) 300 MG tablet Take 300 mg by mouth 2 (two) times daily.     [provider]  ?   ? ?Allergies    ?Codeine, Vancomycin, Escitalopram, Penicillins, Sulfa antibiotics, Erythromycin base, and Cefdinir   ? ?Review of Systems   ?Review of Systems ?10 systems reviewed and negative except as per HPI ?Physical Exam ?Updated Vital Signs ?BP 107/69   Pulse 65   Temp 98 ?F (36.7 ?C) (Oral)   Resp (!) 21   Wt 113.4 kg   SpO2 97%   BMI 41.60 kg/m?  ?Physical Exam ?Constitutional:   ?   Appearance: Normal appearance.  ?HENT:  ?   Head:  Normocephalic and atraumatic.  ?   Mouth/Throat:  ?   Mouth: Mucous membranes are moist.  ?   Pharynx: Oropharynx is clear.  ?Eyes:  ?   Extraocular Movements: Extraocular movements intact.  ?   Conjunctiva/sclera: Conjunctivae normal.  ?   Pupils: Pupils are equal, round, and reactive to light.  ?Cardiovascular:  ?   Rate and Rhythm: Normal rate and regular rhythm.  ?Pulmonary:  ?   Effort: Pulmonary effort is normal.  ?   Breath sounds: Normal breath sounds.  ?Abdominal:  ?   General: There is no distension.  ?   Palpations: Abdomen is soft.  ?   Tenderness: There is no abdominal tenderness. There is no guarding.  ?Musculoskeletal:     ?   General: No swelling or  tenderness. Normal range of motion.  ?   Cervical back: Neck supple.  ?   Right lower leg: No edema.  ?   Left lower leg: No edema.  ?Skin: ?   General: Skin is warm and dry.  ?Neurological:  ?   General: No focal deficit present.  ?   Mental Status: She is alert and oriented to person, place, and time.  ?   Cranial Nerves: No cranial nerve deficit.  ?   Sensory: No sensory deficit.  ?   Motor: No weakness.  ?   Coordination: Coordination normal.  ?Psychiatric:     ?   Mood and Affect: Mood normal.  ? ? ?ED Results / Procedures / Treatments   ?Labs ?(all labs ordered are listed, but only abnormal results are displayed) ?Labs Reviewed  ?BASIC METABOLIC PANEL - Abnormal; Notable for the following components:  ?    Result Value  ? Glucose, Bld 105 (*)   ? All other components within normal limits  ?CBC WITH DIFFERENTIAL/PLATELET - Abnormal; Notable for the following components:  ? RBC 5.61 (*)   ? Hemoglobin 11.6 (*)   ? MCV 66.0 (*)   ? MCH 20.7 (*)   ? All other components within normal limits  ?I-STAT BETA HCG BLOOD, ED ([facility], [facility], [facility] ONLY)  ? ? ?EKG ?None ? ?Radiology ?CT ANGIO HEAD NECK W WO CM ? ?Result Date: 11/07/2021 ?CLINICAL DATA:  Dizziness; patient has history of left vertebral dissection EXAM: CT ANGIOGRAPHY HEAD AND NECK TECHNIQUE: Multidetector CT imaging of the head and neck was performed using the standard protocol during bolus administration of intravenous contrast. Multiplanar CT image reconstructions and MIPs were obtained to evaluate the vascular anatomy. Carotid stenosis measurements (when applicable) are obtained utilizing NASCET criteria, using the distal internal carotid diameter as the denominator. RADIATION DOSE REDUCTION: This exam was performed according to the departmental dose-optimization program which includes automated exposure control, adjustment of the mA and/or kV according to patient size and/or use of iterative reconstruction technique. CONTRAST:  60mL OMNIPAQUE IOHEXOL 350 MG/ML  SOLN COMPARISON:  12/22/2019 FINDINGS: CT HEAD Brain: There is no acute intracranial hemorrhage, mass effect, or edema. Gray-white differentiation is preserved. There is no extra-axial fluid collection. Ventricles and sulci are within normal limits in size and configuration. Vascular: No hyperdense vessel. Skull: Calvarium is unremarkable. Sinuses/Orbits: No acute finding. Other: None. Review of the MIP images confirms the above findings CTA NECK Aortic arch: Great vessel origins are patent. No proximal subclavian artery stenosis. Right carotid system: Patent. No stenosis or evidence of dissection. Left carotid system: Patent.  No stenosis or evidence of dissection. Vertebral arteries: Patent and codominant. No stenosis or evidence of  dissection. Skeleton: No acute or significant abnormality. Other neck: Unremarkable. Upper chest: Included upper lungs are clear. Review of the MIP images confirms the above findings CTA HEAD Anterior circulation: Intracranial internal carotid arteries are patent. Anterior cerebral arteries are patent. Anterior communicating artery is present. Posterior cerebral arteries are patent. Posterior circulation: Intracranial vertebral arteries are patent. Basilar artery is patent. Major cerebellar artery origins are patent. Posterior cerebral arteries are patent. Venous sinuses: Patent as allowed by contrast bolus timing. Review of the MIP images confirms the above findings IMPRESSION: No acute intracranial abnormality. No large vessel occlusion, hemodynamically significant stenosis, or evidence of dissection. Electronically Signed   By: Guadlupe Spanish M.D.   On: 11/07/2021 20:11   ? ?Procedures ?Procedures  ? ? ?Medications Ordered in ED ?Medications  ?HYDROcodone-acetaminophen (NORCO/VICODIN) 5-325 MG per tablet 2 tablet (has no administration in time range)  ?sodium chloride 0.9 % bolus 500 mL (0 mLs Intravenous Stopped 11/07/21 2035)  ?diazepam (VALIUM) injection 5 mg (5 mg Intravenous  Given 11/07/21 1830)  ?ondansetron [facility]) injection 4 mg (4 mg Intravenous Given 11/07/21 1826)  ?iohexol (OMNIPAQUE) 350 MG/ML injection 75 mL (75 mLs Intravenous Contrast Given 11/07/21 1944)  ? ? ?ED Course/ Medical Tommi Rumps

## 2021-11-07 NOTE — Discharge Instructions (Signed)
1.  Take 1 Valium every 8 hours for vertigo if needed. ?2.  Take Zofran if needed for nausea ?3.  Take extra strength Tylenol every 6 hours for headache if needed ?4.  Follow-up with your doctor for recheck within the next 2 to 4 days ?

## 2021-11-07 NOTE — ED Triage Notes (Signed)
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

## 2021-11-15 ENCOUNTER — Ambulatory Visit: Payer: BC Managed Care – PPO | Admitting: Obstetrics and Gynecology

## 2021-11-15 ENCOUNTER — Encounter: Payer: Self-pay | Admitting: Obstetrics and Gynecology

## 2021-11-15 VITALS — BP 122/74 | HR 72 | Ht 65.0 in | Wt 248.0 lb

## 2021-11-15 DIAGNOSIS — N83201 Unspecified ovarian cyst, right side: Secondary | ICD-10-CM | POA: Diagnosis not present

## 2021-11-15 DIAGNOSIS — N83202 Unspecified ovarian cyst, left side: Secondary | ICD-10-CM | POA: Diagnosis not present

## 2021-11-15 DIAGNOSIS — N809 Endometriosis, unspecified: Secondary | ICD-10-CM

## 2021-11-15 NOTE — Progress Notes (Signed)
GYNECOLOGY  VISIT   HPI: 41 y.o.   Married White or Caucasian Not Hispanic or Latino  female   [phone] with Patient's last menstrual period was 10/31/2021.   here for discussion option for ovarian surgery.  She has been followed with bilaterally endometriomas.   She is on POP's. She is having monthly cycles, every 28 days. Bleeds for 7 days, 2 days are heavy where she uses 3 pads a day (improved from up to a pad an hour). With the mini-pill she has no cramps, priors to the mini-pill her cramps were debilitating. No dyspareunia. No pain with bowel movement, no blood in her stool. She had a normal colonoscopy in 10/21.   H/O PP stroke.   Pelvic ultrasound  Indications: f/u adnexal cystic masses  Findings:  Uterus 9.24 x 5.55 x 4.77 cm, anteverted  Endometrium 4.53, no masses seen Left ovary 4.67 x 4.40 x 3.96 cm 3.9 x 3.0 cm echogenic, avascular cyst. Slightly smaller than on prior imaging  Right ovary 6.53 x 5.79 x 4.93 cm 6.3 x 4.0 cm echogenic, avascular cyst. Slightly larger than on prior imaging (6.22 x 3.81 cm)   No free fluid  Impression:  Normal sized anteverted uterus Thin, symmetrical endometrium Bilateral complex ovarian cysts, consistent with endometriomas, minimal change in size Both ovaries appear to be adherent to the posterior uterine wall  GYNECOLOGIC HISTORY: Patient's last menstrual period was 10/31/2021. 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)    SVD (spontaneous vaginal delivery) 10/06/2015   Postpartum care following vaginal delivery (4/13) 10/06/2015   Postpartum state 10/06/2015   Labor and delivery, indication for care 10/05/2015   Seizure disorder (HCC) 01/09/2011    Past Medical History:  Diagnosis Date   Anxiety    Depression    Endometriosis    Gestational diabetes    diet controlled   Gestational diabetes mellitus (GDM), antepartum    IBS (irritable bowel syndrome)    Migraines    Nocturnal epilepsy (HCC)    Postpartum care following vaginal delivery 01/09/2011   Seizure disorder (HCC) 01/09/2011   Seizures (HCC)    STD (sexually transmitted disease)    chlamydia treated   Stroke (HCC)    Thalassemia    Thalassemia minor    Thalassemia trait    Vertigo 08/10/2019    Past Surgical History:  Procedure Laterality Date   BACK SURGERY  08/2018   spine surgery   WISDOM TOOTH EXTRACTION  06/25/2000    Current Outpatient Medications  Medication Sig Dispense Refill   ASPIRIN 81 PO Take 81 mg by mouth daily.  Cholecalciferol (VITAMIN D) 2000 units tablet Take 2,000 Units by mouth at bedtime.     COVID-19 mRNA bivalent vaccine, Moderna, (MODERNA COVID-19 BIVAL BOOSTER) 50 MCG/0.5ML injection Inject into the muscle. 0.5 mL 0   FOLIC ACID PO Take 4 mg by mouth.     hydrOXYzine (ATARAX/VISTARIL) 10 MG tablet Take 10-20 mg by mouth 3 (three) times daily as needed.     INCASSIA 0.35 MG tablet Take 1 tablet (0.35 mg total) by mouth at bedtime. 84 tablet 2   influenza vac split quadrivalent PF (FLUARIX) 0.5 ML injection Inject into the muscle. 0.5 mL 0   Omega-3 Fatty Acids (OMEGA 3 PO) Take by mouth.     Oxcarbazepine (TRILEPTAL) 300 MG tablet Take 300 mg by mouth 2 (two) times daily.      No current facility-administered medications for this visit.     ALLERGIES: Codeine, Vancomycin, Escitalopram, Penicillins, Sulfa antibiotics, Erythromycin base,  and Cefdinir  Family History  Problem Relation Age of Onset   Diabetes Mother    Hypertension Mother    Prostate cancer Father    Diabetes Sister    Lupus Sister    Squamous cell carcinoma Sister    Diabetes Sister    Endometriosis Sister    Bipolar disorder Sister    Diabetes Sister    Fibromyalgia Sister    Endometriosis Sister    Hypertension Sister    Deep vein thrombosis Sister    Breast cancer Maternal Grandmother    Diabetes Maternal Grandmother    Hypertension Maternal Grandmother    Lung cancer Maternal Grandmother    Prostate cancer Maternal Grandfather    Squamous cell carcinoma Maternal Grandfather     Social History   Socioeconomic History   Marital status: Married    Spouse name: Not on file   Number of children: Not on file   Years of education: Not on file   Highest education level: Not on file  Occupational History   Not on file  Tobacco Use   Smoking status: Former    Types: Cigarettes    Quit date: 07/09/2005    Years since quitting: 16.3   Smokeless tobacco: Never  Vaping Use   Vaping Use: Never used  Substance and Sexual Activity   Alcohol use: No   Drug use: No   Sexual activity: Yes    Partners: Male    Birth control/protection: Other-see comments, Pill    Comment: husband vasectomy  Other Topics Concern   Not on file  Social History Narrative   Not on file   Social Determinants of Health   Financial Resource Strain: Not on file  Food Insecurity: Not on file  Transportation Needs: Not on file  Physical Activity: Not on file  Stress: Not on file  Social Connections: Not on file  Intimate Partner Violence: Not on file    Review of Systems  All other systems reviewed and are negative.  PHYSICAL EXAMINATION:    BP 122/74   Pulse 72   Ht 5\' 5"  (1.651 m)   Wt 248 lb (112.5 kg)   LMP 10/31/2021   SpO2 99%   BMI 41.27 kg/m     General appearance: alert, cooperative and appears stated age  89. Bilateral ovarian cysts C/w  endometriomas, large, but minimal change from her ultrasound last month.   2. Endometriosis She has no symptoms on POP. She is ultimately interested in hysterectomy, not a candidate for estrogen. We discussed the option of laparoscopy, bilateral cystectomy,  possible right oophorectomy. Will repeat her ultrasound in 6 months and make a plan at that point.   ~21 minutes spent in total patient care.

## 2021-12-07 NOTE — Progress Notes (Deleted)
41 y.o. Marilyn Black Married White or Caucasian Not Hispanic or Latino female here for annual exam.      No LMP recorded.          Sexually active: Yes.    The current method of family planning is oral progesterone-only contraceptive/vasectomy.    Exercising: {yes no:314532}  {types:19826} Smoker:  no  Health Maintenance: Pap:  2022 per patient  History of abnormal Pap:  no MMG:  none  BMD:   none  Colonoscopy: 10/21 diverticulosis otherwise normal  TDaP:  2022 Gardasil: x2   reports that she quit smoking about 16 years ago. Her smoking use included cigarettes. She has never used smokeless tobacco. She reports that she does not drink alcohol and does not use drugs.  Past Medical History:  Diagnosis Date   Anxiety    Depression    Endometriosis    Gestational diabetes    diet controlled   Gestational diabetes mellitus (GDM), antepartum    IBS (irritable bowel syndrome)    Migraines    Nocturnal epilepsy (HCC)    Postpartum care following vaginal delivery 01/09/2011   Seizure disorder (HCC) 01/09/2011   Seizures (HCC)    STD (sexually transmitted disease)    chlamydia treated   Stroke (HCC)    Thalassemia    Thalassemia minor    Thalassemia trait    Vertigo 08/10/2019    Past Surgical History:  Procedure Laterality Date   BACK SURGERY  08/2018   spine surgery   WISDOM TOOTH EXTRACTION  06/25/2000    Current Outpatient Medications  Medication Sig Dispense Refill   ASPIRIN 81 PO Take 81 mg by mouth daily.      Cholecalciferol (VITAMIN D) 2000 units tablet Take 2,000 Units by mouth at bedtime.     COVID-19 mRNA bivalent vaccine, Moderna, (MODERNA COVID-19 BIVAL BOOSTER) 50 MCG/0.5ML injection Inject into the muscle. 0.5 mL 0   FOLIC ACID PO Take 4 mg by mouth.     hydrOXYzine (ATARAX/VISTARIL) 10 MG tablet Take 10-20 mg by mouth 3 (three) times daily as needed.     INCASSIA 0.35 MG tablet Take 1 tablet (0.35 mg total) by mouth at bedtime. 84 tablet 2   influenza vac  split quadrivalent PF (FLUARIX) 0.5 ML injection Inject into the muscle. 0.5 mL 0   Omega-3 Fatty Acids (OMEGA 3 PO) Take by mouth.     Oxcarbazepine (TRILEPTAL) 300 MG tablet Take 300 mg by mouth 2 (two) times daily.      No current facility-administered medications for this visit.    Family History  Problem Relation Age of Onset   Diabetes Mother    Hypertension Mother    Prostate cancer Father    Diabetes Sister    Lupus Sister    Squamous cell carcinoma Sister    Diabetes Sister    Endometriosis Sister    Bipolar disorder Sister    Diabetes Sister    Fibromyalgia Sister    Endometriosis Sister    Hypertension Sister    Deep vein thrombosis Sister    Breast cancer Maternal Grandmother    Diabetes Maternal Grandmother    Hypertension Maternal Grandmother    Lung cancer Maternal Grandmother    Prostate cancer Maternal Grandfather    Squamous cell carcinoma Maternal Grandfather     Review of Systems  Exam:   There were no vitals taken for this visit.  Weight change: @WEIGHTCHANGE @ Height:      Ht Readings from Last 3 Encounters:  11/15/21 5\' 5"  (1.651 m)  08/16/21 5\' 5"  (1.651 m)  03/23/21 5\' 5"  (1.651 m)    General appearance: alert, cooperative and appears stated age Head: Normocephalic, without obvious abnormality, atraumatic Neck: no adenopathy, supple, symmetrical, trachea midline and thyroid {CHL AMB PHY EX THYROID NORM [phone]::"normal to inspection and palpation"} Lungs: clear to auscultation bilaterally Cardiovascular: regular rate and rhythm Breasts: {Exam; breast:13139::"normal appearance, no masses or tenderness"} Abdomen: soft, non-tender; non distended,  no masses,  no organomegaly Extremities: extremities normal, atraumatic, no cyanosis or edema Skin: Skin color, texture, turgor normal. No rashes or lesions Lymph nodes: Cervical, supraclavicular, and axillary nodes normal. No abnormal inguinal nodes palpated Neurologic: Grossly  normal   Pelvic: External genitalia:  no lesions              Urethra:  normal appearing urethra with no masses, tenderness or lesions              Bartholins and Skenes: normal                 Vagina: normal appearing vagina with normal color and discharge, no lesions              Cervix: {CHL AMB PHY EX CERVIX NORM [phone]::"no lesions"}               Bimanual Exam:  Uterus:  {CHL AMB PHY EX UTERUS NORM DEFAULT:[phone]::"normal size, contour, position, consistency, mobility, non-tender"}              Adnexa: {CHL AMB PHY EX ADNEXA NO MASS DEFAULT:[phone]::"no mass, fullness, tenderness"}               Rectovaginal: Confirms               Anus:  normal sphincter tone, no lesions  *** chaperoned for the exam.  A:  Well Woman with normal exam  P:

## 2021-12-19 NOTE — Progress Notes (Signed)
41 y.o. [id number] Married White or Caucasian Not Hispanic or Latino female here for annual exam. Reports no new problems since last seen on 11/15/21.   Period Cycle (Days):  (21-25) Period Duration (Days): 5-7 Menstrual Flow:  (varies) Menstrual Control: Maxi pad Dysmenorrhea: (!) Mild Dysmenorrhea Symptoms: Cramping She has some deep dyspareunia, positional, tolerable, no change.   She is being followed with bilateral complex ovarian cysts, consistent with endometriomas. 3.9 cm on the left at 6.3 cm on the right. Last ultrasound was in 4/23.   Patient's last menstrual period was 12/20/2021 (exact date).          Sexually active: Yes.    The current method of family planning is vasectomy/POPs.    Exercising: No.   Not so much this summer.  Smoker:  no  Health Maintenance: Pap: 2022 per pt History of abnormal Pap:  no MMG: none BMD: none Colonoscopy: 03/2020 diverticulosis, otherwise normal TDaP: 2022 Gardasil: 10/19/05, second dose on 12/16/20   reports that she quit smoking about 16 years ago. Her smoking use included cigarettes. She has never used smokeless tobacco. She reports that she does not drink alcohol and does not use drugs. 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 Kids are 6 and 10.   Past Medical History:  Diagnosis Date   Anxiety    Depression    Endometriosis    Gestational diabetes    diet controlled   Gestational diabetes mellitus (GDM), antepartum    IBS (irritable bowel syndrome)    Migraines    Nocturnal epilepsy (HCC)    Postpartum care following vaginal delivery 01/09/2011   Seizure disorder (HCC) 01/09/2011   Seizures (HCC)    STD (sexually transmitted disease)    chlamydia treated   Stroke (HCC)    Thalassemia    Thalassemia minor    Thalassemia trait    Vertigo 08/10/2019    Past Surgical History:  Procedure Laterality Date   BACK SURGERY  08/2018   spine surgery   WISDOM TOOTH EXTRACTION  06/25/2000    Current Outpatient  Medications  Medication Sig Dispense Refill   ASPIRIN 81 PO Take 81 mg by mouth daily.      Cholecalciferol (VITAMIN D) 2000 units tablet Take 2,000 Units by mouth at bedtime.     COVID-19 mRNA bivalent vaccine, Moderna, (MODERNA COVID-19 BIVAL BOOSTER) 50 MCG/0.5ML injection Inject into the muscle. 0.5 mL 0   FOLIC ACID PO Take 4 mg by mouth.     hydrOXYzine (ATARAX/VISTARIL) 10 MG tablet Take 10-20 mg by mouth 3 (three) times daily as needed.     INCASSIA 0.35 MG tablet Take 1 tablet (0.35 mg total) by mouth at bedtime. 84 tablet 2   influenza vac split quadrivalent PF (FLUARIX) 0.5 ML injection Inject into the muscle. 0.5 mL 0   Omega-3 Fatty Acids (OMEGA 3 PO) Take by mouth.     Oxcarbazepine (TRILEPTAL) 300 MG tablet Take 300 mg by mouth 2 (two) times daily.      rosuvastatin (CRESTOR) 5 MG tablet Take 1 tablet by mouth daily.     No current facility-administered medications for this visit.    Family History  Problem Relation Age of Onset   Diabetes Mother    Hypertension Mother    Prostate cancer Father    Diabetes Sister    Lupus Sister    Squamous cell carcinoma Sister    Diabetes Sister    Endometriosis Sister    Bipolar disorder  Sister    Diabetes Sister    Fibromyalgia Sister    Endometriosis Sister    Hypertension Sister    Deep vein thrombosis Sister    Breast cancer Maternal Grandmother    Diabetes Maternal Grandmother    Hypertension Maternal Grandmother    Lung cancer Maternal Grandmother    Prostate cancer Maternal Grandfather    Squamous cell carcinoma Maternal Grandfather     Review of Systems  All other systems reviewed and are negative.   Exam:   BP 102/68   Pulse 82   Ht 5' 5.5" (1.664 m)   Wt 247 lb (112 kg)   LMP 12/20/2021 (Exact Date)   SpO2 98%   BMI 40.48 kg/m   Weight change: @WEIGHTCHANGE @ Height:   Height: 5' 5.5" (166.4 cm)  Ht Readings from Last 3 Encounters:  12/25/21 5' 5.5" (1.664 m)  11/15/21 5\' 5"  (1.651 m)  08/16/21 5\' 5"   (1.651 m)    General appearance: alert, cooperative and appears stated age Head: Normocephalic, without obvious abnormality, atraumatic Neck: no adenopathy, supple, symmetrical, trachea midline and thyroid normal to inspection and palpation Lungs: clear to auscultation bilaterally Cardiovascular: regular rate and rhythm Breasts: normal appearance, no masses or tenderness Abdomen: soft, non-tender; non distended,  no masses,  no organomegaly Extremities: extremities normal, atraumatic, no cyanosis or edema Skin: Skin color, texture, turgor normal. No rashes or lesions Lymph nodes: Cervical, supraclavicular, and axillary nodes normal. No abnormal inguinal nodes palpated Neurologic: Grossly normal   Pelvic: External genitalia:  no lesions              Urethra:  normal appearing urethra with no masses, tenderness or lesions              Bartholins and Skenes: normal                 Vagina: normal appearing vagina with normal color and discharge, no lesions              Cervix: no lesions               Bimanual Exam:  Uterus:   no masses or tenderness              Adnexa: no mass, fullness, tenderness               Rectovaginal: Confirms               Anus:  normal sphincter tone, no lesions  , CMA chaperoned for the exam.  1. Well woman exam Discussed breast self exam Discussed calcium and vit D intake Mammogram in the fall after she turns 41  2. Encounter for surveillance of contraceptive pills Aware that her minipill is not as effective with her anti-seizure medication. Vasectomy for contraception - INCASSIA 0.35 MG tablet; Take 1 tablet (0.35 mg total) by mouth at bedtime.  Dispense: 84 tablet; Refill: 3  3. Bilateral ovarian cysts C/W endometriomas - 08/18/21 PELVIS TRANSVAGINAL NON-OB (TV ONLY); Future 11/23 -she is considering surgery w/in the next year, discussed referral to Endometriosis center   4. Endometriosis Currently symptoms are stable - Ladona Ridgel PELVIS TRANSVAGINAL  NON-OB (TV ONLY); Future  5. 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 Screening for cervical cancer - Cytology - PAP  7. Immunization due - HPV 9-valent vaccine,Recombinat

## 2021-12-20 ENCOUNTER — Ambulatory Visit: Payer: 59 | Admitting: Obstetrics and Gynecology

## 2021-12-25 ENCOUNTER — Encounter: Payer: Self-pay | Admitting: Obstetrics and Gynecology

## 2021-12-25 ENCOUNTER — Ambulatory Visit (INDEPENDENT_AMBULATORY_CARE_PROVIDER_SITE_OTHER): Payer: BC Managed Care – PPO | Admitting: Obstetrics and Gynecology

## 2021-12-25 ENCOUNTER — Other Ambulatory Visit (HOSPITAL_COMMUNITY)
Admission: RE | Admit: 2021-12-25 | Discharge: 2021-12-25 | Disposition: A | Discharge status: Home / Self Care | Payer: BC Managed Care – PPO | Source: Ambulatory Visit | Attending: Obstetrics and Gynecology | Admitting: Obstetrics and Gynecology

## 2021-12-25 VITALS — BP 102/68 | HR 82 | Ht 65.5 in | Wt 247.0 lb

## 2021-12-25 DIAGNOSIS — N83202 Unspecified ovarian cyst, left side: Secondary | ICD-10-CM

## 2021-12-25 DIAGNOSIS — Z23 Encounter for immunization: Secondary | ICD-10-CM | POA: Diagnosis not present

## 2021-12-25 DIAGNOSIS — N83201 Unspecified ovarian cyst, right side: Secondary | ICD-10-CM

## 2021-12-25 DIAGNOSIS — Z8669 Personal history of other diseases of the nervous system and sense organs: Secondary | ICD-10-CM | POA: Insufficient documentation

## 2021-12-25 DIAGNOSIS — Z3041 Encounter for surveillance of contraceptive pills: Secondary | ICD-10-CM | POA: Diagnosis not present

## 2021-12-25 DIAGNOSIS — F419 Anxiety disorder, unspecified: Secondary | ICD-10-CM | POA: Insufficient documentation

## 2021-12-25 DIAGNOSIS — K219 Gastro-esophageal reflux disease without esophagitis: Secondary | ICD-10-CM | POA: Insufficient documentation

## 2021-12-25 DIAGNOSIS — N809 Endometriosis, unspecified: Secondary | ICD-10-CM

## 2021-12-25 DIAGNOSIS — M519 Unspecified thoracic, thoracolumbar and lumbosacral intervertebral disc disorder: Secondary | ICD-10-CM | POA: Insufficient documentation

## 2021-12-25 DIAGNOSIS — B360 Pityriasis versicolor: Secondary | ICD-10-CM | POA: Insufficient documentation

## 2021-12-25 DIAGNOSIS — Z8632 Personal history of gestational diabetes: Secondary | ICD-10-CM | POA: Insufficient documentation

## 2021-12-25 DIAGNOSIS — Z01419 Encounter for gynecological examination (general) (routine) without abnormal findings: Secondary | ICD-10-CM | POA: Diagnosis not present

## 2021-12-25 DIAGNOSIS — H8110 Benign paroxysmal vertigo, unspecified ear: Secondary | ICD-10-CM | POA: Insufficient documentation

## 2021-12-25 DIAGNOSIS — I63212 Cerebral infarction due to unspecified occlusion or stenosis of left vertebral arteries: Secondary | ICD-10-CM

## 2021-12-25 DIAGNOSIS — Z124 Encounter for screening for malignant neoplasm of cervix: Secondary | ICD-10-CM | POA: Diagnosis present

## 2021-12-25 MED ORDER — INCASSIA 0.35 MG PO TABS: 1.0000 | ORAL_TABLET | Freq: Every day | ORAL | 3 refills | Status: AC

## 2021-12-25 NOTE — Patient Instructions (Signed)

## 2021-12-28 LAB — CYTOLOGY - PAP
Comment: NEGATIVE
Diagnosis: NEGATIVE
High risk HPV: NEGATIVE

## 2022-02-05 ENCOUNTER — Other Ambulatory Visit: Payer: Self-pay | Admitting: Obstetrics and Gynecology

## 2022-02-05 DIAGNOSIS — Z1231 Encounter for screening mammogram for malignant neoplasm of breast: Secondary | ICD-10-CM

## 2022-04-18 ENCOUNTER — Ambulatory Visit: Payer: BC Managed Care – PPO

## 2022-04-18 IMAGING — CT CT ABD-PELV W/ CM
2 of 4 series · 16 of 46 positions shown, 18 images · IV contrast (Omnipaque)
Comparison: None.

CLINICAL DATA: Right lower quadrant abdominal pain for 5 days

EXAM:
CT ABDOMEN AND PELVIS WITH CONTRAST
TECHNIQUE: Multidetector CT imaging of the abdomen and pelvis was performed
using the standard protocol following bolus administration of
intravenous contrast.
CONTRAST:  100mL OMNIPAQUE IOHEXOL 300 MG/ML  SOLN

[Series 2: axial st · axial · 0.86mm/px · z∈[+527,+967]mm · 13 of 98 slices shown, 15 images]
[im 5/98  soft-tissue]
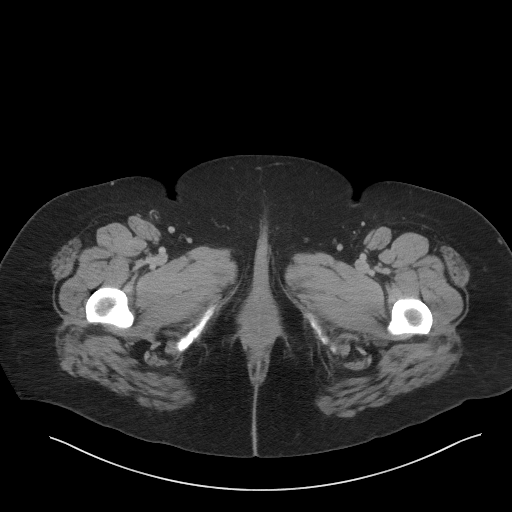
[im 5/98  bone]
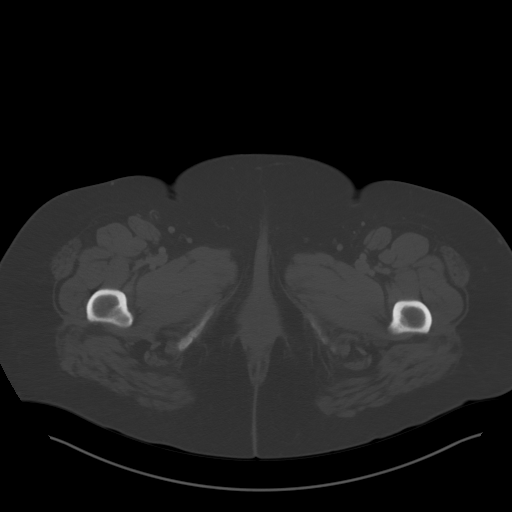
[im 13/98  soft-tissue]
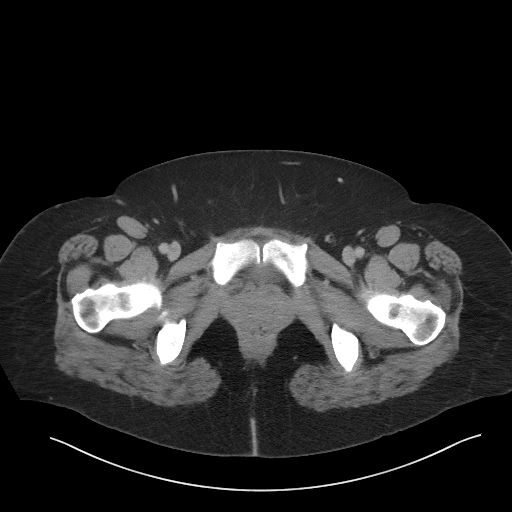
[im 21/98  soft-tissue]
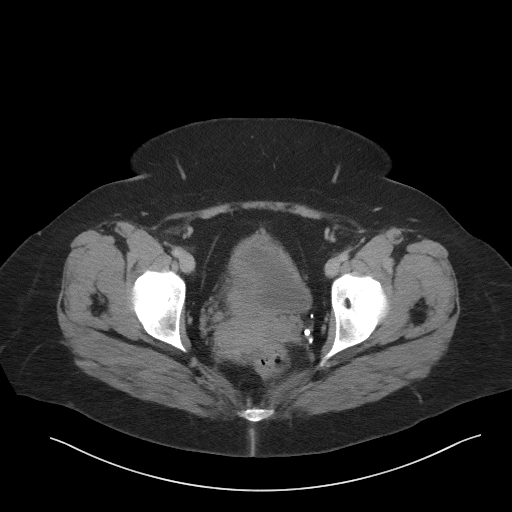
[im 29/98  soft-tissue]
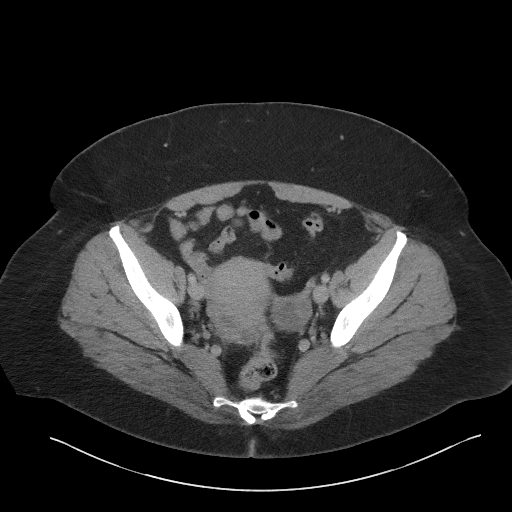
[im 33/98  soft-tissue]
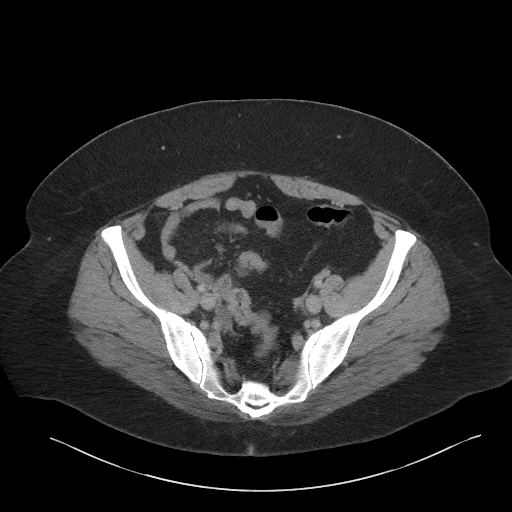
[im 41/98  soft-tissue]
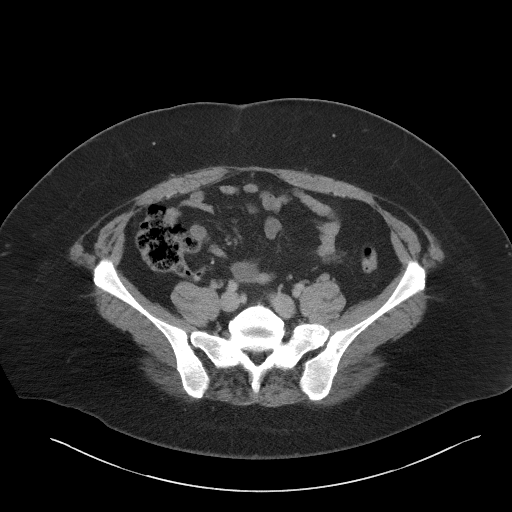
[im 49/98  soft-tissue]
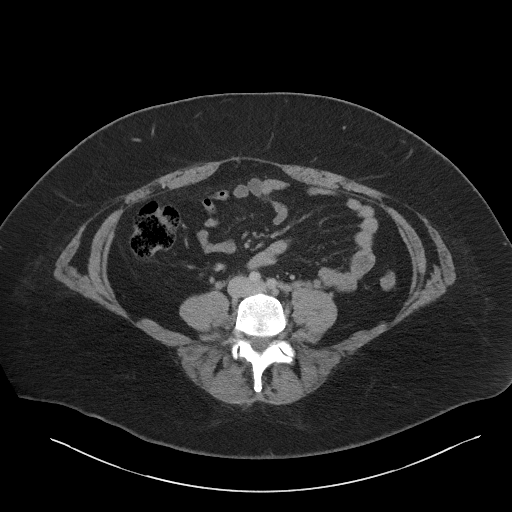
[im 57/98  soft-tissue]
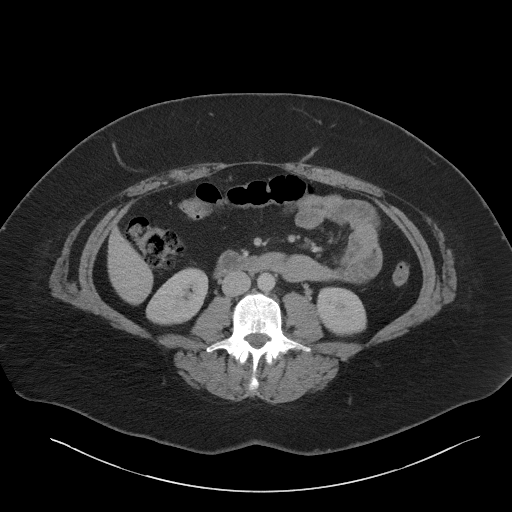
[im 65/98  soft-tissue]
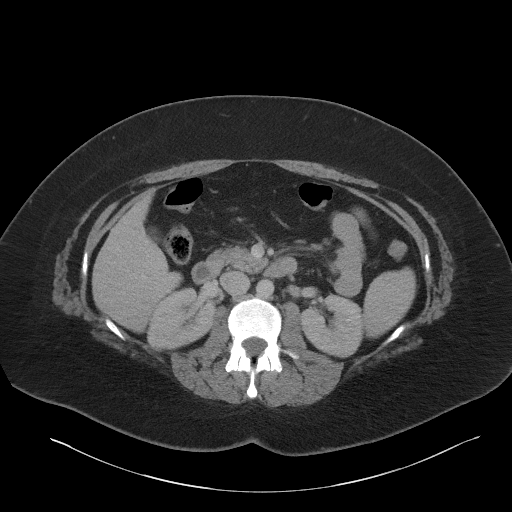
[im 65/98  bone]
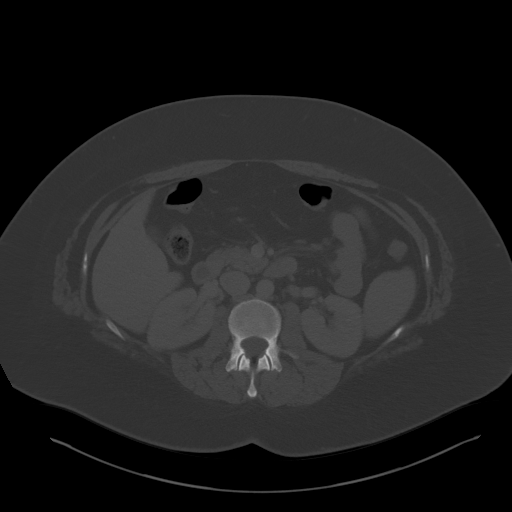
[im 69/98  soft-tissue]
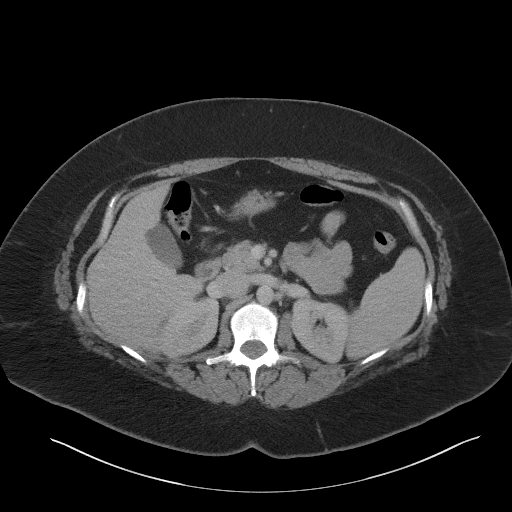
[im 77/98  soft-tissue]
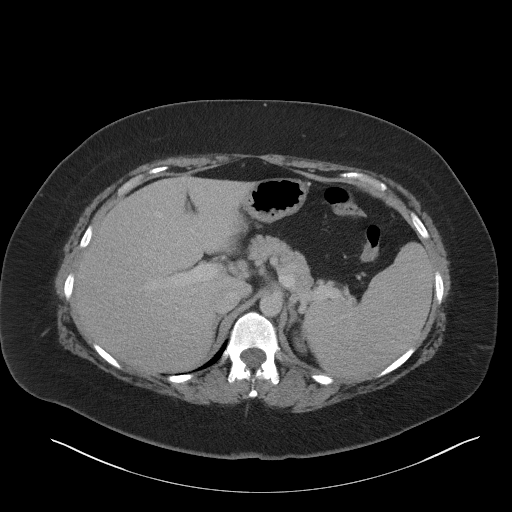
[im 85/98  soft-tissue]
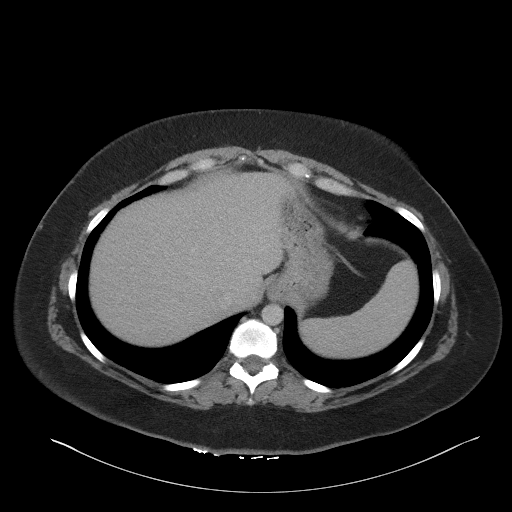
[im 93/98  soft-tissue]
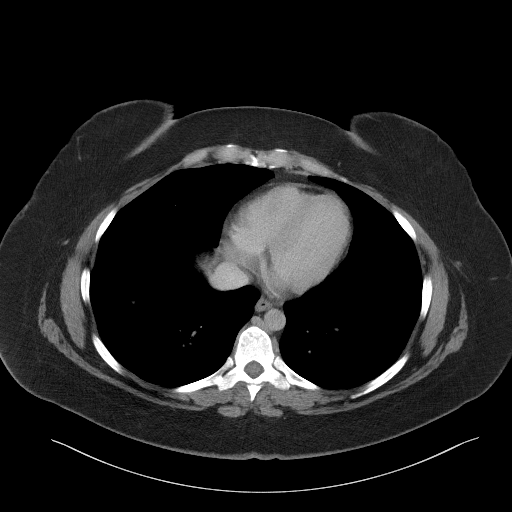

[Series 5: coronal st · coronal · 0.93mm/px · 3 of 92 slices shown]
[im 31/92  soft-tissue]
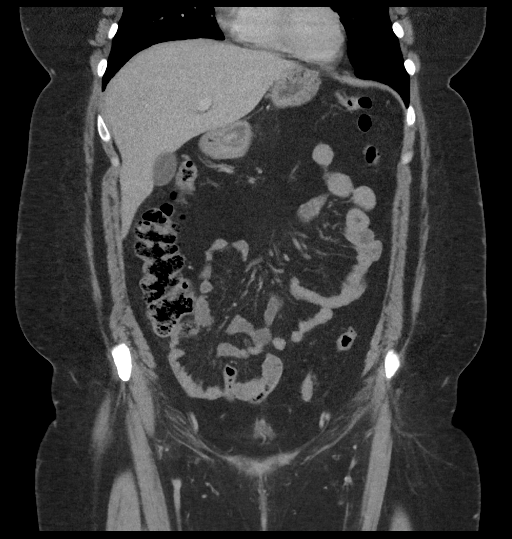
[im 41/92  soft-tissue]
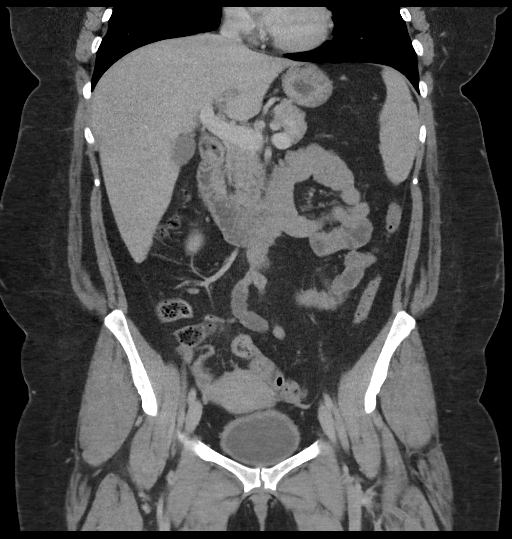
[im 51/92  soft-tissue]
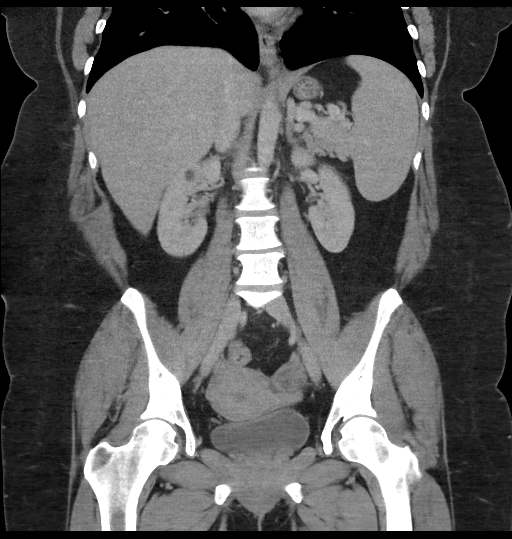

[16 of 46 positions shown; findings below may reference images not displayed]

FINDINGS: Lower chest: The visualized heart size within normal limits. No
pericardial fluid/thickening.

No hiatal hernia.

The visualized portions of the lungs are clear.

Hepatobiliary: The liver is normal in density without focal
abnormality.The main portal vein is patent. No evidence of calcified
gallstones, gallbladder wall thickening or biliary dilatation.

Pancreas: Unremarkable. No pancreatic ductal dilatation or
surrounding inflammatory changes.

Spleen: Normal in size without focal abnormality.

Adrenals/Urinary Tract: Both adrenal glands appear normal. The
kidneys and collecting system appear normal without evidence of
urinary tract calculus or hydronephrosis. Bladder is unremarkable.

Stomach/Bowel: The stomach, small bowel, and colon are normal in
appearance. No inflammatory changes, wall thickening, or obstructive
findings.The appendix is normal.

Vascular/Lymphatic: There are no enlarged mesenteric,
retroperitoneal, or pelvic lymph nodes. No significant vascular
findings are present.

Reproductive: The uterus and adnexa are unremarkable. Within the
left adnexa there is a 3 cm low-density lesion, likely left ovarian
cyst. A small amount of free fluid in the cul-de-sac.

Other: No evidence of abdominal wall mass or hernia.

Musculoskeletal: No acute or significant osseous findings.
IMPRESSION: Normal appearing appendix.

No other acute intra-abdominal or pelvic pathology to explain the
patient's symptoms.

Probable 3 cm left ovarian cyst.

## 2022-05-01 ENCOUNTER — Other Ambulatory Visit: Payer: BC Managed Care – PPO | Admitting: Obstetrics and Gynecology

## 2022-05-01 ENCOUNTER — Other Ambulatory Visit: Payer: BC Managed Care – PPO

## 2022-05-11 ENCOUNTER — Ambulatory Visit
Admission: RE | Admit: 2022-05-11 | Discharge: 2022-05-11 | Disposition: A | Discharge status: Home / Self Care | Payer: BC Managed Care – PPO | Source: Ambulatory Visit | Attending: Obstetrics and Gynecology | Admitting: Obstetrics and Gynecology

## 2022-05-11 DIAGNOSIS — Z1231 Encounter for screening mammogram for malignant neoplasm of breast: Secondary | ICD-10-CM

## 2022-05-16 ENCOUNTER — Other Ambulatory Visit: Payer: Self-pay | Admitting: Obstetrics and Gynecology

## 2022-05-16 DIAGNOSIS — R928 Other abnormal and inconclusive findings on diagnostic imaging of breast: Secondary | ICD-10-CM

## 2022-05-24 ENCOUNTER — Ambulatory Visit: Payer: BC Managed Care – PPO | Admitting: Obstetrics and Gynecology

## 2022-05-24 ENCOUNTER — Encounter: Payer: Self-pay | Admitting: Obstetrics and Gynecology

## 2022-05-24 ENCOUNTER — Ambulatory Visit: Payer: BC Managed Care – PPO

## 2022-05-24 VITALS — BP 128/62 | HR 72 | Wt 255.0 lb

## 2022-05-24 DIAGNOSIS — N83201 Unspecified ovarian cyst, right side: Secondary | ICD-10-CM

## 2022-05-24 DIAGNOSIS — N809 Endometriosis, unspecified: Secondary | ICD-10-CM | POA: Diagnosis not present

## 2022-05-24 DIAGNOSIS — N83202 Unspecified ovarian cyst, left side: Secondary | ICD-10-CM

## 2022-05-24 DIAGNOSIS — I63212 Cerebral infarction due to unspecified occlusion or stenosis of left vertebral arteries: Secondary | ICD-10-CM

## 2022-05-24 NOTE — Progress Notes (Signed)
GYNECOLOGY  VISIT   HPI: 41 y.o.   Married White or Caucasian Not Hispanic or Latino  female   [id number] with Patient's last menstrual period was 05/23/2022 (exact date).   Here to discuss u/s.  She has been followed with bilateral complex ovarian cysts, c/w endometriomas. We have discussed surgery and observation. Her last u/s was in 4/23 and she was noted to have a 3.9 cm echogenic left ovarian cyst and a 6.3 cm echogenic right ovarian cyst. Both were noted to be adherent to her uterus.   She is on POP, controls her cramps. No dyspareunia. Prior to the POP she was having severe cramps and dyspareunia. Happy with the POP.  GYNECOLOGIC HISTORY: Patient's last menstrual period was 05/23/2022 (exact date). Contraception:vasectomy Menopausal hormone therapy: n/a        OB History     Gravida  3   Para  2   Term  2   Preterm      AB  1   Living  2      SAB  1   IAB      Ectopic      Multiple  0   Live Births  2              Patient Active Problem List   Diagnosis Date Noted   Anxiety 12/25/2021   Benign paroxysmal positional vertigo 12/25/2021   Gastroesophageal reflux disease without esophagitis 12/25/2021   History of gestational diabetes mellitus 12/25/2021   Morbid (severe) obesity due to excess calories (HCC) 12/25/2021   History of migraine 12/25/2021   Tinea versicolor 12/25/2021   Unspecified thoracic, thoracolumbar and lumbosacral intervertebral disc disorder 12/25/2021   Disturbances of vision, late effect of stroke 06/13/2021   Insomnia 05/06/2021   Family history of genetic disorder 02/09/2021   Moderate episode of recurrent major depressive disorder (HCC) 02/09/2021   Panic disorder without agoraphobia 02/09/2021   Chronic post-traumatic stress disorder (PTSD) 11/28/2020   GAD (generalized anxiety disorder) 11/28/2020   Beta thalassemia (HCC) 06/09/2020   Myalgia 02/27/2020   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 02/25/2020   Vitamin D deficiency  02/25/2020   Irritable bowel syndrome with diarrhea 01/21/2020   Status post lumbar microdiscectomy 01/20/2020   Personal history of transient ischemic attack (TIA), and cerebral infarction without residual deficits 12/31/2019   Endometriosis 12/14/2019   At high risk for breast cancer 11/25/2019   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   Carrier of beta thalassemia 11/02/2019   Migraine 11/02/2019   Mixed anxiety and depressive disorder 11/02/2019   Vertigo 08/10/2019   Lumbar radiculopathy 07/17/2018   Low back pain 07/08/2018   Prolapsed lumbar disc 12/09/2017   Degeneration of lumbar intervertebral disc 12/09/2017   Dislocation of patellofemoral joint 12/09/2017   Lumbar pain 11/13/2017   Cerebellar infarct (HCC) 08/22/2016   Lipid disorder 08/22/2016   Seizures (HCC) 01/17/2016   Stroke (cerebrum) (HCC)    Postpartum hemorrhage of vagina 11/14/2015   Hyperlipidemia with target LDL less than 70 11/14/2015   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   Neck pain 11/13/2015   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)    SVD (spontaneous vaginal delivery) 10/06/2015   Postpartum care following vaginal delivery (4/13) 10/06/2015   Postpartum state 10/06/2015   Labor and delivery, indication for care 10/05/2015   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   Seizure disorder (HCC) 01/09/2011    Past Medical History:  Diagnosis Date   Anxiety  Depression    Endometriosis    Gestational diabetes    diet controlled   Gestational diabetes mellitus (GDM), antepartum    IBS (irritable bowel syndrome)    Migraines    Nocturnal epilepsy (HCC)    Postpartum care following vaginal delivery 01/09/2011   Seizure disorder (HCC) 01/09/2011   Seizures (HCC)    STD (sexually transmitted disease)    chlamydia treated   Stroke (HCC)    Thalassemia    Thalassemia minor    Thalassemia trait    Vertigo 08/10/2019    Past Surgical History:  Procedure  Laterality Date   BACK SURGERY  08/2018   spine surgery   WISDOM TOOTH EXTRACTION  06/25/2000    Current Outpatient Medications  Medication Sig Dispense Refill   ASPIRIN 81 PO Take 81 mg by mouth daily.      Cholecalciferol (VITAMIN D) 2000 units tablet Take 2,000 Units by mouth at bedtime.     COVID-19 mRNA bivalent vaccine, Moderna, (MODERNA COVID-19 BIVAL BOOSTER) 50 MCG/0.5ML injection Inject into the muscle. 0.5 mL 0   FOLIC ACID PO Take 4 mg by mouth.     INCASSIA 0.35 MG tablet Take 1 tablet (0.35 mg total) by mouth at bedtime. 84 tablet 3   Omega-3 Fatty Acids (OMEGA 3 PO) Take by mouth.     Oxcarbazepine (TRILEPTAL) 300 MG tablet Take 300 mg by mouth 2 (two) times daily.      rosuvastatin (CRESTOR) 5 MG tablet Take 1 tablet by mouth daily.     influenza vac split quadrivalent PF (FLUARIX) 0.5 ML injection Inject into the muscle. 0.5 mL 0   No current facility-administered medications for this visit.     ALLERGIES: Codeine, Vancomycin, Escitalopram, Penicillins, Sulfa antibiotics, Erythromycin base, and Cefdinir  Family History  Problem Relation Age of Onset   Diabetes Mother    Hypertension Mother    Prostate cancer Father    Diabetes Sister    Lupus Sister    Squamous cell carcinoma Sister    Diabetes Sister    Endometriosis Sister    Bipolar disorder Sister    Diabetes Sister    Fibromyalgia Sister    Endometriosis Sister    Hypertension Sister    Deep vein thrombosis Sister    Breast cancer Maternal Grandmother        58s   Diabetes Maternal Grandmother    Hypertension Maternal Grandmother    Lung cancer Maternal Grandmother    Prostate cancer Maternal Grandfather    Squamous cell carcinoma Maternal Grandfather     Social History   Socioeconomic History   Marital status: Married    Spouse name: Not on file   Number of children: Not on file   Years of education: Not on file   Highest education level: Not on file  Occupational History   Not on file   Tobacco Use   Smoking status: Former    Types: Cigarettes    Quit date: 07/09/2005    Years since quitting: 16.8   Smokeless tobacco: Never  Vaping Use   Vaping Use: Never used  Substance and Sexual Activity   Alcohol use: No   Drug use: No   Sexual activity: Yes    Partners: Male    Birth control/protection: Other-see comments, Pill    Comment: husband vasectomy  Other Topics Concern   Not on file  Social History Narrative   Not on file   Social Determinants of Health   Financial Resource Strain:  Not on file  Food Insecurity: Not on file  Transportation Needs: Not on file  Physical Activity: Not on file  Stress: Not on file  Social Connections: Not on file  Intimate Partner Violence: Not on file    Review of Systems  All other systems reviewed and are negative.   PHYSICAL EXAMINATION:    BP 128/62   Pulse 72   Wt 255 lb (115.7 kg)   LMP 05/23/2022 (Exact Date)   SpO2 100%   BMI 41.79 kg/m     General appearance: alert, cooperative and appears stated age  Pelvic ultrasound  Indications: f/u bilateral ovarian cysts  Findings:  Uterus 8.53 x 5.65 x 4.67 cm, anteverted  Endometrium 4.43 mm  Left ovary 5.41 x 3.74 x 4.25 cm Stable 3.44 x 2.5 cm echogenic cyst with debris New 3.7 x 2.19 cm cyst with debris  Right ovary 7.07 x 5.81 x 4.89 cm Stable 6.10 x 4.09 echogenic cyst with debris New 2.6 x 2.05 cm cyst with debris  Bilateral ovarian cysts are avascular. Both ovaries are adherent to the uterus posteriorly  No free fluid  Impression:  Normal sized anteverted uterus Thin endometrium without masses Both ovaries enlarged with complex cysts c/w endometriomas Both ovaries are adherent to the posterior uterus  1. Bilateral ovarian cysts Enlarging bilateral ovarian cysts c/w endometriomas.  Will refer to [facility]  2. Endometriosis Symptoms controlled on POP, but her ovarian cysts are growing  3. Cerebral  infarction due to occlusion of left vertebral artery (HCC) Not a candidate for estrogen.

## 2022-06-01 ENCOUNTER — Ambulatory Visit
Admission: RE | Admit: 2022-06-01 | Discharge: 2022-06-01 | Disposition: A | Discharge status: Home / Self Care | Payer: BC Managed Care – PPO | Source: Ambulatory Visit | Attending: Obstetrics and Gynecology | Admitting: Obstetrics and Gynecology

## 2022-06-01 ENCOUNTER — Other Ambulatory Visit: Payer: Self-pay | Admitting: Obstetrics and Gynecology

## 2022-06-01 DIAGNOSIS — R928 Other abnormal and inconclusive findings on diagnostic imaging of breast: Secondary | ICD-10-CM

## 2022-06-11 ENCOUNTER — Ambulatory Visit
Admission: RE | Admit: 2022-06-11 | Discharge: 2022-06-11 | Disposition: A | Discharge status: Home / Self Care | Payer: BC Managed Care – PPO | Source: Ambulatory Visit | Attending: Obstetrics and Gynecology | Admitting: Obstetrics and Gynecology

## 2022-06-11 DIAGNOSIS — R928 Other abnormal and inconclusive findings on diagnostic imaging of breast: Secondary | ICD-10-CM

## 2022-06-11 HISTORY — PX: BREAST BIOPSY: SHX20

## 2022-06-29 ENCOUNTER — Ambulatory Visit: Payer: Self-pay | Admitting: Surgery

## 2022-06-29 DIAGNOSIS — N6311 Unspecified lump in the right breast, upper outer quadrant: Secondary | ICD-10-CM

## 2022-07-05 ENCOUNTER — Other Ambulatory Visit: Payer: Self-pay | Admitting: Surgery

## 2022-07-05 DIAGNOSIS — N6311 Unspecified lump in the right breast, upper outer quadrant: Secondary | ICD-10-CM

## 2022-08-01 ENCOUNTER — Encounter (HOSPITAL_BASED_OUTPATIENT_CLINIC_OR_DEPARTMENT_OTHER): Payer: Self-pay | Admitting: Surgery

## 2022-08-01 ENCOUNTER — Other Ambulatory Visit: Payer: Self-pay

## 2022-08-01 NOTE — Progress Notes (Signed)
   08/01/22 1516  PAT Phone Screen  Is the patient taking a GLP-1 receptor agonist? No  Do You Have Diabetes? No  Do You Have Hypertension? No  Have You Ever Been to the ER for Asthma? No  Have You Taken Oral Steroids in the Past 3 Months? No  Do you Take Phenteramine or any Other Diet Drugs? No  Recent  Lab Work, EKG, CXR? No  Do you have a history of heart problems? No  Any Recent Hospitalizations? No  Height 5\' 5"  (1.651 m)  Weight 118 kg  Pat Appointment Scheduled No  Reason for No Appointment Not Needed   Reviewed pt's pmh and last neuro note with Dr Deatra Canter, No clearance needed prior to [facility].

## 2022-08-06 ENCOUNTER — Ambulatory Visit
Admission: RE | Admit: 2022-08-06 | Discharge: 2022-08-06 | Disposition: A | Discharge status: Home / Self Care | Payer: BC Managed Care – PPO | Source: Ambulatory Visit | Attending: Surgery | Admitting: Surgery

## 2022-08-06 DIAGNOSIS — N6311 Unspecified lump in the right breast, upper outer quadrant: Secondary | ICD-10-CM

## 2022-08-06 HISTORY — PX: BREAST BIOPSY: SHX20

## 2022-08-07 ENCOUNTER — Ambulatory Visit (HOSPITAL_BASED_OUTPATIENT_CLINIC_OR_DEPARTMENT_OTHER): Payer: BC Managed Care – PPO | Admitting: Anesthesiology

## 2022-08-07 ENCOUNTER — Other Ambulatory Visit: Payer: Self-pay

## 2022-08-07 ENCOUNTER — Encounter (HOSPITAL_BASED_OUTPATIENT_CLINIC_OR_DEPARTMENT_OTHER): Payer: Self-pay | Admitting: Surgery

## 2022-08-07 ENCOUNTER — Encounter (HOSPITAL_BASED_OUTPATIENT_CLINIC_OR_DEPARTMENT_OTHER)
Admission: RE | Disposition: A | Discharge status: Home / Self Care | Payer: Self-pay | Source: Home / Self Care | Attending: Surgery

## 2022-08-07 ENCOUNTER — Ambulatory Visit
Admission: RE | Admit: 2022-08-07 | Discharge: 2022-08-07 | Disposition: A | Discharge status: Home / Self Care | Payer: BC Managed Care – PPO | Source: Ambulatory Visit | Attending: Surgery | Admitting: Surgery

## 2022-08-07 ENCOUNTER — Ambulatory Visit (HOSPITAL_BASED_OUTPATIENT_CLINIC_OR_DEPARTMENT_OTHER)
Admission: RE | Admit: 2022-08-07 | Discharge: 2022-08-07 | Disposition: A | Discharge status: Home / Self Care | Payer: BC Managed Care – PPO | Attending: Surgery | Admitting: Surgery

## 2022-08-07 DIAGNOSIS — N6311 Unspecified lump in the right breast, upper outer quadrant: Secondary | ICD-10-CM | POA: Insufficient documentation

## 2022-08-07 DIAGNOSIS — Z803 Family history of malignant neoplasm of breast: Secondary | ICD-10-CM | POA: Diagnosis not present

## 2022-08-07 DIAGNOSIS — Z87891 Personal history of nicotine dependence: Secondary | ICD-10-CM | POA: Insufficient documentation

## 2022-08-07 DIAGNOSIS — Z8673 Personal history of transient ischemic attack (TIA), and cerebral infarction without residual deficits: Secondary | ICD-10-CM | POA: Insufficient documentation

## 2022-08-07 DIAGNOSIS — R569 Unspecified convulsions: Secondary | ICD-10-CM | POA: Insufficient documentation

## 2022-08-07 DIAGNOSIS — Z01818 Encounter for other preprocedural examination: Secondary | ICD-10-CM

## 2022-08-07 DIAGNOSIS — Z8042 Family history of malignant neoplasm of prostate: Secondary | ICD-10-CM | POA: Insufficient documentation

## 2022-08-07 HISTORY — PX: BREAST LUMPECTOMY WITH RADIOACTIVE SEED LOCALIZATION: SHX6424

## 2022-08-07 LAB — POCT PREGNANCY, URINE: Preg Test, Ur: NEGATIVE

## 2022-08-07 SURGERY — BREAST LUMPECTOMY WITH RADIOACTIVE SEED LOCALIZATION
Anesthesia: General | Site: Breast | Laterality: Right

## 2022-08-07 MED ORDER — CHLORHEXIDINE GLUCONATE CLOTH 2 % EX PADS: 6.0000 | MEDICATED_PAD | Freq: Once | CUTANEOUS | Status: DC

## 2022-08-07 MED ORDER — LACTATED RINGERS IV SOLN: INTRAVENOUS | Status: DC

## 2022-08-07 MED ORDER — BUPIVACAINE HCL (PF) 0.25 % IJ SOLN
INTRAMUSCULAR | Status: DC | PRN
  Administered 2022-08-07: 20 mL

## 2022-08-07 MED ORDER — ACETAMINOPHEN 10 MG/ML IV SOLN: 1000.0000 mg | Freq: Once | INTRAVENOUS | Status: DC | PRN

## 2022-08-07 MED ORDER — MIDAZOLAM HCL 2 MG/2ML IJ SOLN
INTRAMUSCULAR | Status: DC | PRN
  Administered 2022-08-07: 2 mg via INTRAVENOUS

## 2022-08-07 MED ORDER — IBUPROFEN 800 MG PO TABS: 800.0000 mg | ORAL_TABLET | Freq: Three times a day (TID) | ORAL | 0 refills | Status: AC | PRN

## 2022-08-07 MED ORDER — CLINDAMYCIN PHOSPHATE 900 MG/50ML IV SOLN
INTRAVENOUS | Status: AC
  Filled 2022-08-07: qty 50

## 2022-08-07 MED ORDER — PROPOFOL 500 MG/50ML IV EMUL
INTRAVENOUS | Status: DC | PRN
  Administered 2022-08-07: 40 ug/kg/min via INTRAVENOUS

## 2022-08-07 MED ORDER — ONDANSETRON HCL 4 MG/2ML IJ SOLN
INTRAMUSCULAR | Status: AC
  Filled 2022-08-07: qty 2

## 2022-08-07 MED ORDER — PHENYLEPHRINE HCL (PRESSORS) 10 MG/ML IV SOLN
INTRAVENOUS | Status: DC | PRN
  Administered 2022-08-07: 80 ug via INTRAVENOUS
  Administered 2022-08-07 (×2): 160 ug via INTRAVENOUS
  Administered 2022-08-07: 80 ug via INTRAVENOUS

## 2022-08-07 MED ORDER — CLINDAMYCIN PHOSPHATE 900 MG/50ML IV SOLN: 900.0000 mg | INTRAVENOUS | Status: DC

## 2022-08-07 MED ORDER — PROPOFOL 10 MG/ML IV BOLUS
INTRAVENOUS | Status: DC | PRN
  Administered 2022-08-07: 180 mg via INTRAVENOUS

## 2022-08-07 MED ORDER — LIDOCAINE 2% (20 MG/ML) 5 ML SYRINGE
INTRAMUSCULAR | Status: AC
  Filled 2022-08-07: qty 5

## 2022-08-07 MED ORDER — SODIUM CHLORIDE 0.9 % IV SOLN
INTRAVENOUS | Status: AC
  Filled 2022-08-07: qty 10

## 2022-08-07 MED ORDER — 0.9 % SODIUM CHLORIDE (POUR BTL) OPTIME
TOPICAL | Status: DC | PRN
  Administered 2022-08-07: 600 mL

## 2022-08-07 MED ORDER — LIDOCAINE HCL (CARDIAC) PF 100 MG/5ML IV SOSY
PREFILLED_SYRINGE | INTRAVENOUS | Status: DC | PRN
  Administered 2022-08-07: 60 mg via INTRAVENOUS

## 2022-08-07 MED ORDER — OXYCODONE HCL 5 MG PO TABS: 5.0000 mg | ORAL_TABLET | Freq: Four times a day (QID) | ORAL | 0 refills | Status: AC | PRN

## 2022-08-07 MED ORDER — MIDAZOLAM HCL 2 MG/2ML IJ SOLN
INTRAMUSCULAR | Status: AC
  Filled 2022-08-07: qty 2

## 2022-08-07 MED ORDER — LACTATED RINGERS IV SOLN: INTRAVENOUS | Status: DC | PRN

## 2022-08-07 MED ORDER — CLINDAMYCIN PHOSPHATE 900 MG/50ML IV SOLN
INTRAVENOUS | Status: DC | PRN
  Administered 2022-08-07: 900 mg via INTRAVENOUS

## 2022-08-07 MED ORDER — FENTANYL CITRATE (PF) 100 MCG/2ML IJ SOLN
INTRAMUSCULAR | Status: AC
  Filled 2022-08-07: qty 2

## 2022-08-07 MED ORDER — FENTANYL CITRATE (PF) 100 MCG/2ML IJ SOLN
INTRAMUSCULAR | Status: DC | PRN
  Administered 2022-08-07 (×2): 25 ug via INTRAVENOUS
  Administered 2022-08-07: 50 ug via INTRAVENOUS

## 2022-08-07 MED ORDER — PROPOFOL 10 MG/ML IV BOLUS
INTRAVENOUS | Status: AC
  Filled 2022-08-07: qty 20

## 2022-08-07 MED ORDER — DEXAMETHASONE SODIUM PHOSPHATE 10 MG/ML IJ SOLN
INTRAMUSCULAR | Status: DC | PRN
  Administered 2022-08-07: 5 mg via INTRAVENOUS

## 2022-08-07 MED ORDER — FENTANYL CITRATE (PF) 100 MCG/2ML IJ SOLN: 25.0000 ug | INTRAMUSCULAR | Status: DC | PRN

## 2022-08-07 MED ORDER — ONDANSETRON HCL 4 MG/2ML IJ SOLN
INTRAMUSCULAR | Status: DC | PRN
  Administered 2022-08-07: 4 mg via INTRAVENOUS

## 2022-08-07 SURGICAL SUPPLY — 46 items
ADH SKN CLS APL DERMABOND .7 (GAUZE/BANDAGES/DRESSINGS) ×1
APL PRP STRL LF DISP 70% ISPRP (MISCELLANEOUS) ×1
APPLIER CLIP 9.375 MED OPEN (MISCELLANEOUS)
APR CLP MED 9.3 20 MLT OPN (MISCELLANEOUS)
BINDER BREAST XXLRG (GAUZE/BANDAGES/DRESSINGS) IMPLANT
BLADE SURG 15 STRL LF DISP TIS (BLADE) ×1 IMPLANT
BLADE SURG 15 STRL SS (BLADE) ×1
CANISTER SUC SOCK COL 7IN (MISCELLANEOUS) IMPLANT
CANISTER SUCT 1200ML W/VALVE (MISCELLANEOUS) IMPLANT
CHLORAPREP W/TINT 26 (MISCELLANEOUS) ×1 IMPLANT
CLIP APPLIE 9.375 MED OPEN (MISCELLANEOUS) IMPLANT
COVER BACK TABLE 60X90IN (DRAPES) ×1 IMPLANT
COVER MAYO STAND STRL (DRAPES) ×1 IMPLANT
COVER PROBE CYLINDRICAL 5X96 (MISCELLANEOUS) ×1 IMPLANT
DERMABOND ADVANCED .7 DNX12 (GAUZE/BANDAGES/DRESSINGS) ×1 IMPLANT
DRAPE LAPAROTOMY 100X72 PEDS (DRAPES) ×1 IMPLANT
DRAPE UTILITY XL STRL (DRAPES) ×1 IMPLANT
ELECT COATED BLADE 2.86 ST (ELECTRODE) ×1 IMPLANT
ELECT REM PT RETURN 9FT ADLT (ELECTROSURGICAL) ×1
ELECTRODE REM PT RTRN 9FT ADLT (ELECTROSURGICAL) ×1 IMPLANT
GLOVE BIO SURGEON STRL SZ 6.5 (GLOVE) IMPLANT
GLOVE BIOGEL PI IND STRL 8 (GLOVE) ×1 IMPLANT
GLOVE ECLIPSE 8.0 STRL XLNG CF (GLOVE) ×1 IMPLANT
GOWN STRL REUS W/ TWL LRG LVL3 (GOWN DISPOSABLE) ×2 IMPLANT
GOWN STRL REUS W/ TWL XL LVL3 (GOWN DISPOSABLE) ×1 IMPLANT
GOWN STRL REUS W/TWL LRG LVL3 (GOWN DISPOSABLE) ×2
GOWN STRL REUS W/TWL XL LVL3 (GOWN DISPOSABLE) ×1
HEMOSTAT ARISTA ABSORB 3G PWDR (HEMOSTASIS) IMPLANT
HEMOSTAT SNOW SURGICEL 2X4 (HEMOSTASIS) IMPLANT
KIT MARKER MARGIN INK (KITS) ×1 IMPLANT
NDL HYPO 25X1 1.5 SAFETY (NEEDLE) ×1 IMPLANT
NEEDLE HYPO 25X1 1.5 SAFETY (NEEDLE) ×1 IMPLANT
NS IRRIG 1000ML POUR BTL (IV SOLUTION) ×1 IMPLANT
PACK BASIN DAY SURGERY FS (CUSTOM PROCEDURE TRAY) ×1 IMPLANT
PENCIL SMOKE EVACUATOR (MISCELLANEOUS) ×1 IMPLANT
SLEEVE SCD COMPRESS KNEE MED (STOCKING) ×1 IMPLANT
SPIKE FLUID TRANSFER (MISCELLANEOUS) IMPLANT
SPONGE T-LAP 4X18 ~~LOC~~+RFID (SPONGE) ×1 IMPLANT
SUT MNCRL AB 4-0 PS2 18 (SUTURE) ×1 IMPLANT
SUT SILK 2 0 SH (SUTURE) IMPLANT
SUT VICRYL 3-0 CR8 SH (SUTURE) ×1 IMPLANT
SYR CONTROL 10ML LL (SYRINGE) ×1 IMPLANT
TOWEL GREEN STERILE FF (TOWEL DISPOSABLE) ×1 IMPLANT
TRAY FAXITRON CT DISP (TRAY / TRAY PROCEDURE) ×1 IMPLANT
TUBE CONNECTING 20X1/4 (TUBING) IMPLANT
YANKAUER SUCT BULB TIP NO VENT (SUCTIONS) IMPLANT

## 2022-08-07 NOTE — Op Note (Signed)
Preoperative diagnosis: Right breast mass upper outer quadrant  Postoperative diagnosis: Same  Procedure: Right breast seed localized lumpectomy  Surgeon: Erroll Luna, MD  Anesthesia: LMA with 0.25% Marcaine  EBL: Minimal  Specimen: Right breast mass with seed and clip verified by Faxitron  Drains: None  Indications for procedure: The patient is a 42 year old female who presents with a right breast mass.  Core biopsy was obtained but this was found to be discordant to her final pathology.  Given the fact that this was felt to be discordant, lumpectomy is recommended for removal.  Risks and benefits of surgery were explained to the patient.  Potential complications the procedure were explained.  Alternative options of treatment were discussed with the patient.  She agreed to proceed with right breast seed localized lumpectomy after reviewing all the above.The procedure has been discussed with the patient. Alternatives to surgery have been discussed with the patient.  Risks of surgery include bleeding,  Infection,  Seroma formation, death,  and the need for further surgery.   The patient understands and wishes to proceed.    Description of procedure: The patient was met in the holding area and questions were answered.  Right breast was marked as correct site.  Of note a seed was placed as an outpatient.  Films were available for review.  She was taken back to the operating room.  She was placed supine upon the operating table.  After induction of general anesthesia, the right breast was prepped and draped in a sterile fashion.  Timeout was performed.  The seed was localized in the right upper central to slightly right upper quadrant breast area.  This was about 2 fingerbreadths above the nipple areolar complex.  Curvilinear incision was made along the superior border of this.  Dissection was carried down.  The tissue was identified and excised with grossly negative margins.  The Faxitron image  revealed the seed and clip to be present in the specimen.  Hemostasis was achieved with cautery.  Irrigation was used.  Local anesthetic was infiltrated throughout the cavity.  The cavity was hemostatic.  It was then closed with 3-0 Vicryl.  4 Monocryl was used to close the skin in a subcuticular fashion.  Dermabond is applied.  All counts were found to be correct.  Breast binder placed.  The patient was then awoke extubated taken to recovery in satisfactory condition.

## 2022-08-07 NOTE — Anesthesia Postprocedure Evaluation (Signed)
Anesthesia Post Note  Patient: Marilyn Black  Procedure(s) Performed: RIGHT BREAST LUMPECTOMY WITH RADIOACTIVE SEED LOCALIZATION (Right: Breast)     Patient location during evaluation: PACU Anesthesia Type: General Level of consciousness: awake and alert Pain management: pain level controlled Vital Signs Assessment: post-procedure vital signs reviewed and stable Respiratory status: spontaneous breathing, nonlabored ventilation, respiratory function stable and patient connected to nasal cannula oxygen Cardiovascular status: blood pressure returned to baseline and stable Postop Assessment: no apparent nausea or vomiting Anesthetic complications: no   No notable events documented.  Last Vitals:  Vitals:   08/07/22 0956 08/07/22 1012  BP:  123/66  Pulse: 79 84  Resp: 14   Temp:  (!) 36.4 C  SpO2: 97% 98%    Last Pain:  Vitals:   08/07/22 1012  TempSrc: Temporal  PainSc: 0-No pain                 Belenda Cruise P Keyante Durio

## 2022-08-07 NOTE — Transfer of Care (Signed)
Immediate Anesthesia Transfer of Care Note  Patient: Marilyn Black  Procedure(s) Performed: RIGHT BREAST LUMPECTOMY WITH RADIOACTIVE SEED LOCALIZATION (Right: Breast)  Patient Location: PACU  Anesthesia Type:General  Level of Consciousness: awake, drowsy, and patient cooperative  Airway & Oxygen Therapy: Patient Spontanous Breathing and Patient connected to face mask oxygen  Post-op Assessment: Report given to RN and Post -op Vital signs reviewed and stable  Post vital signs: Reviewed and stable  Last Vitals:  Vitals Value Taken Time  BP 121/71 08/07/22 0937  Temp    Pulse 87 08/07/22 0944  Resp 16 08/07/22 0944  SpO2 100 % 08/07/22 0944  Vitals shown include unvalidated device data.  Last Pain:  Vitals:   08/07/22 0746  TempSrc: Oral  PainSc: 0-No pain         Complications: No notable events documented.

## 2022-08-07 NOTE — Anesthesia Preprocedure Evaluation (Addendum)
Anesthesia Evaluation  Patient identified by MRN, date of birth, ID band Patient awake    Reviewed: Allergy & Precautions, NPO status , Patient's Chart, lab work & pertinent test results  Airway Mallampati: II  TM Distance: >3 FB Neck ROM: Full    Dental no notable dental hx.    Pulmonary former smoker   Pulmonary exam normal        Cardiovascular negative cardio ROS  Rhythm:Regular Rate:Normal     Neuro/Psych  Headaches, Seizures -, Well Controlled,   Anxiety Depression    CVA, No Residual Symptoms    GI/Hepatic Neg liver ROS,GERD  ,,  Endo/Other  diabetes    Renal/GU   negative genitourinary   Musculoskeletal  (+) Arthritis ,  Right breast mass   Abdominal Normal abdominal exam  (+)   Peds  Hematology  (+) Blood dyscrasia, anemia   Anesthesia Other Findings   Reproductive/Obstetrics                             Anesthesia Physical Anesthesia Plan  ASA: 3  Anesthesia Plan: General   Post-op Pain Management:    Induction: Intravenous  PONV Risk Score and Plan: 3 and Ondansetron, Dexamethasone, Midazolam and Treatment may vary due to age or medical condition  Airway Management Planned: Mask and LMA  Additional Equipment: None  Intra-op Plan:   Post-operative Plan: Extubation in OR  Informed Consent: I have reviewed the patients History and Physical, chart, labs and discussed the procedure including the risks, benefits and alternatives for the proposed anesthesia with the patient or authorized representative who has indicated his/her understanding and acceptance.     Dental advisory given  Plan Discussed with: CRNA  Anesthesia Plan Comments:        Anesthesia Quick Evaluation

## 2022-08-07 NOTE — Discharge Instructions (Addendum)
[facility] Office Phone Number [phone]  BREAST BIOPSY/ PARTIAL MASTECTOMY: POST OP INSTRUCTIONS  Always review your discharge instruction sheet given to you by the facility where your surgery was performed.  IF YOU HAVE DISABILITY OR FAMILY LEAVE FORMS, YOU MUST BRING THEM TO THE OFFICE FOR PROCESSING.  DO NOT GIVE THEM TO YOUR DOCTOR.  A prescription for pain medication may be given to you upon discharge.  Take your pain medication as prescribed, if needed.  If narcotic pain medicine is not needed, then you may take acetaminophen (Tylenol) or ibuprofen (Advil) as needed. Take your usually prescribed medications unless otherwise directed If you need a refill on your pain medication, please contact your pharmacy.  They will contact our office to request authorization.  Prescriptions will not be filled after 5pm or on week-ends. You should eat very light the first 24 hours after surgery, such as soup, crackers, pudding, etc.  Resume your normal diet the day after surgery. Most patients will experience some swelling and bruising in the breast.  Ice packs and a good support bra will help.  Swelling and bruising can take several days to resolve.  It is common to experience some constipation if taking pain medication after surgery.  Increasing fluid intake and taking a stool softener will usually help or prevent this problem from occurring.  A mild laxative (Milk of Magnesia or Miralax) should be taken according to package directions if there are no bowel movements after 48 hours. Unless discharge instructions indicate otherwise, you may remove your bandages 24-48 hours after surgery, and you may shower at that time.  You may have steri-strips (small skin tapes) in place directly over the incision.  These strips should be left on the skin for 7-10 days.  If your surgeon used skin glue on the incision, you may shower in 24 hours.  The glue will flake off over the next 2-3 weeks.  Any  sutures or staples will be removed at the office during your follow-up visit. ACTIVITIES:  You may resume regular daily activities (gradually increasing) beginning the next day.  Wearing a good support bra or sports bra minimizes pain and swelling.  You may have sexual intercourse when it is comfortable. You may drive when you no longer are taking prescription pain medication, you can comfortably wear a seatbelt, and you can safely maneuver your car and apply brakes. RETURN TO WORK:  ______________________________________________________________________________________ You should see your doctor in the office for a follow-up appointment approximately two weeks after your surgery.  Your doctor's nurse will typically make your follow-up appointment when she calls you with your pathology report.  Expect your pathology report 2-3 business days after your surgery.  You may call to check if you do not hear from us after three days. OTHER INSTRUCTIONS: _______________________________________________________________________________________________ _____________________________________________________________________________________________________________________________________ _____________________________________________________________________________________________________________________________________ _____________________________________________________________________________________________________________________________________  WHEN TO CALL YOUR DOCTOR: Fever over 101.0 Nausea and/or vomiting. Extreme swelling or bruising. Continued bleeding from incision. Increased pain, redness, or drainage from the incision.  The clinic staff is available to answer your questions during regular business hours.  Please don't hesitate to call and ask to speak to one of the nurses for clinical concerns.  If you have a medical emergency, go to the nearest emergency room or call 911.  A surgeon from [facility] is always on call at the hospital.  For further questions, please visit [email or web address]    Post Anesthesia Home Care Instructions  Activity: Get plenty of rest for the remainder of   the day. A responsible individual must stay with you for 24 hours following the procedure.  For the next 24 hours, DO NOT: -Drive a car -Operate machinery -Drink alcoholic beverages -Take any medication unless instructed by your physician -Make any legal decisions or sign important papers.  Meals: Start with liquid foods such as gelatin or soup. Progress to regular foods as tolerated. Avoid greasy, spicy, heavy foods. If nausea and/or vomiting occur, drink only clear liquids until the nausea and/or vomiting subsides. Call your physician if vomiting continues.  Special Instructions/Symptoms: Your throat may feel dry or sore from the anesthesia or the breathing tube placed in your throat during surgery. If this causes discomfort, gargle with warm salt water. The discomfort should disappear within 24 hours.  If you had a scopolamine patch placed behind your ear for the management of post- operative nausea and/or vomiting:  1. The medication in the patch is effective for 72 hours, after which it should be removed.  Wrap patch in a tissue and discard in the trash. Wash hands thoroughly with soap and water. 2. You may remove the patch earlier than 72 hours if you experience unpleasant side effects which may include dry mouth, dizziness or visual disturbances. 3. Avoid touching the patch. Wash your hands with soap and water after contact with the patch.      

## 2022-08-07 NOTE — Anesthesia Procedure Notes (Signed)
Procedure Name: LMA Insertion Date/Time: 08/07/2022 8:50 AM  Performed by: Verita Lamb, CRNAPre-anesthesia Checklist: Patient identified, Emergency Drugs available, Suction available and Patient being monitored Patient Re-evaluated:Patient Re-evaluated prior to induction Oxygen Delivery Method: Circle system utilized Preoxygenation: Pre-oxygenation with 100% oxygen Induction Type: IV induction Ventilation: Mask ventilation without difficulty LMA: LMA inserted LMA Size: 4.0 Number of attempts: 1 Airway Equipment and Method: Bite block Placement Confirmation: positive ETCO2, CO2 detector and breath sounds checked- equal and bilateral Tube secured with: Tape Dental Injury: Teeth and Oropharynx as per pre-operative assessment

## 2022-08-07 NOTE — Interval H&P Note (Signed)
History and Physical Interval Note:  08/07/2022 8:15 AM  Marilyn Black  has presented today for surgery, with the diagnosis of RIGHT BREAST MASS.  The various methods of treatment have been discussed with the patient and family. After consideration of risks, benefits and other options for treatment, the patient has consented to  Procedure(s): RIGHT BREAST LUMPECTOMY WITH RADIOACTIVE SEED LOCALIZATION (Right) as a surgical intervention.  The patient's history has been reviewed, patient examined, no change in status, stable for surgery.  I have reviewed the patient's chart and labs.  Questions were answered to the patient's satisfaction.     [place]

## 2022-08-07 NOTE — H&P (Signed)
History of Present Illness: Marilyn Black is a 42 y.o. female who is seen today as an office consultation for evaluation of Breast Mass .   Patient presents for evaluation of right breast discordant biopsy. Core biopsy showed fibroadenomatoid change for mammographic abnormality right breast upper outer quadrant detected on screening detected mammogram. She had a grandmother with breast cancer she thinks in her 66s or older. No other history of breast cancer noted. Family history of prostate cancer. Denies history of breast pain nipple discharge or breast mass.  Review of Systems: A complete review of systems was obtained from the patient. I have reviewed this information and discussed as appropriate with the patient. See HPI as well for other ROS.    Medical History: Past Medical History:  Diagnosis Date  Anemia  Anxiety  History of stroke  Hyperlipidemia  Seizures (CMS-HCC)   There is no problem list on file for this patient.  Past Surgical History:  Procedure Laterality Date  herniated disc surgery    Allergies  Allergen Reactions  Codeine Other (See Comments) and Rash  fever  Escitalopram Other (See Comments)  SSRI's and SNRI's cause serotonin syndrome  Vancomycin Anaphylaxis  Penicillins Other (See Comments) and Rash  Has patient had a PCN reaction causing immediate rash, facial/tongue/throat swelling, SOB or lightheadedness with hypotension: Yes Has patient had a PCN reaction causing severe rash involving mucus membranes or skin necrosis: No Has patient had a PCN reaction that required hospitalization No Has patient had a PCN reaction occurring within the last 10 years: No If all of the above answers are "NO", then may proceed with Cephalosporin use.  Sulfa (Sulfonamide Antibiotics) Other (See Comments) and Rash  Erythromycin Base Other (See Comments)  Childhood reaction   Current Outpatient Medications on File Prior to Visit  Medication Sig Dispense Refill  aspirin  81 MG EC tablet Take by mouth daily  cholecalciferol (VITAMIN D3) 2,000 unit tablet Take by mouth  folic acid (FOLVITE) Q000111Q MCG tablet Take by mouth  INCASSIA 0.35 mg tablet TAKE 1 TABLET (0.35 MG TOTAL) BY MOUTH AT BEDTIME.  magnesium glycinate, bulk, 10 % Powd Take by mouth  melatonin 3 mg tablet Take by mouth  OXcarbazepine (TRILEPTAL) 300 MG tablet Take 300 mg by mouth 2 (two) times daily  rosuvastatin (CRESTOR) 5 MG tablet Take 1 tablet by mouth once daily  vitamin B complex (B COMPLEX 1 ORAL) Take by mouth   No current facility-administered medications on file prior to visit.   Family History  Problem Relation Age of Onset  Obesity Mother  High blood pressure (Hypertension) Mother  Diabetes Mother  Skin cancer Father  Obesity Father  Hyperlipidemia (Elevated cholesterol) Father  Myocardial Infarction (Heart attack) Father  Skin cancer Sister  Obesity Sister  High blood pressure (Hypertension) Sister  Hyperlipidemia (Elevated cholesterol) Sister  Diabetes Sister  Deep vein thrombosis (DVT or abnormal blood clot formation) Sister    Social History   Tobacco Use  Smoking Status Former  Types: Cigarettes  Quit date: 2011  Years since quitting: 13.0  Smokeless Tobacco Never    Social History   Socioeconomic History  Marital status: Married  Tobacco Use  Smoking status: Former  Types: Cigarettes  Quit date: 2011  Years since quitting: 13.0  Smokeless tobacco: Never   Objective:   Vitals:  06/29/22 0938  BP: 110/64  Pulse: 68  Weight: (!) 118.3 kg (260 lb 12.8 oz)  Height: 165.1 cm (5' 5"$ )  PainSc: 0-No pain  Body mass index is 43.4 kg/m.  Physical Exam Exam conducted with a chaperone present.  HENT:  Head: Normocephalic.  Eyes:  Pupils: Pupils are equal, round, and reactive to light.  Cardiovascular:  Rate and Rhythm: Normal rate.  Pulmonary:  Effort: Pulmonary effort is normal.  Breath sounds: No stridor.  Chest:  Breasts: Right: Normal.   Left: Normal.  Comments: Right shows mild postbiopsy change Musculoskeletal:  General: Normal range of motion.  Cervical back: Normal range of motion.  Lymphadenopathy:  Upper Body:  Right upper body: No supraclavicular or axillary adenopathy.  Left upper body: No supraclavicular or axillary adenopathy.  Skin: General: Skin is warm.  Neurological:  General: No focal deficit present.  Mental Status: She is alert and oriented to person, place, and time.     Labs, Imaging and Diagnostic Testing:  Diagnosis Breast, right, needle core biopsy, upper, outer quadrant, x clip FIBROCYSTIC CHANGES FOCAL FIBROADENOMATOID CHANGE NEGATIVE FOR MALIGNANCY SEE NOTE Diagnosis Note [place] was notified on 06/12/2022.  ADDENDUM: Pathology revealed FIBROCYSTIC CHANGES, FOCAL FIBROADENOMATOID CHANGE of the RIGHT breast, upper outer quadrant (X clip).  This was found to be discordant by Dr. Lajean Manes, with surgical consultation for excision recommended.  Pathology results were discussed with the patient by telephone. The patient reported doing well after the biopsy with tenderness and pink area at the site. Post biopsy instructions and care were reviewed and questions were answered. The patient was encouraged to call [facility] for any additional concerns. I was unable to reach patient on 12/21 for update. Patient has direct number for nurse.  Surgical consultation has been arranged with Dr. Erroll Luna at [facility] on June 29, 2022.  Pathology results reported by Stacie Acres RN on 06/12/2022.   Electronically Signed By: Lajean Manes M.D. On: 06/16/2022 09:58  Addended by Lajean Manes, MD on 06/16/2022 11:58 AM   Study Result  Narrative & Impression  CLINICAL DATA: Patient presents for stereotactic core needle biopsy area distortion in the right breast.  EXAM: RIGHT BREAST STEREOTACTIC CORE NEEDLE  BIOPSY  COMPARISON: Previous exam(s).  FINDINGS: The patient and I discussed the procedure of stereotactic-guided biopsy including benefits and alternatives. We discussed the high likelihood of a successful procedure. We discussed the risks of the procedure including infection, bleeding, tissue injury, clip migration, and inadequate sampling. Informed written consent was given. The usual time out protocol was performed immediately prior to the procedure.  Using sterile technique and 1% Lidocaine as local anesthetic, under stereotactic guidance, a 9 gauge vacuum assisted device was used to perform core needle biopsy of of distortion in the breast using a superior approach.  Lesion quadrant: Upper outer quadrant  At the conclusion of the procedure, an X shaped tissue marker clip was deployed into the biopsy cavity. Follow-up 2-view mammogram was performed and dictated separately.  IMPRESSION: Stereotactic-guided biopsy of a right breast architectural distortion. No apparent complications.  Electronically Signed: By: Lajean Manes M.D. On: 06/11/2022 09:13   Assessment and Plan:   Diagnoses and all orders for this visit:  Mass of upper outer quadrant of right breast    Plan for right breast seed localized lumpectomy. This is due for discordance of pathology and imaging. This was discussed with the patient. Pros and cons of removal versus observation reviewed. Complications of surgery as well as nonoperative management reviewed.The procedure has been discussed with the patient. Alternatives to surgery have been discussed with the patient. Risks of surgery  include bleeding, cosmesis, infection, Seroma formation, death, and the need for further surgery. The patient understands and wishes to proceed.   No follow-ups on file.  Kennieth Francois, MD   I spent a total of 40 minutes in both face-to-fa

## 2022-08-08 ENCOUNTER — Encounter (HOSPITAL_BASED_OUTPATIENT_CLINIC_OR_DEPARTMENT_OTHER): Payer: Self-pay | Admitting: Surgery

## 2022-08-08 LAB — SURGICAL PATHOLOGY

## 2022-08-08 NOTE — Progress Notes (Signed)
.  ks

## 2022-08-09 ENCOUNTER — Encounter: Payer: Self-pay | Admitting: Surgery

## 2022-08-16 ENCOUNTER — Encounter (HOSPITAL_COMMUNITY): Payer: Self-pay

## 2023-02-10 ENCOUNTER — Emergency Department (HOSPITAL_BASED_OUTPATIENT_CLINIC_OR_DEPARTMENT_OTHER)
Admission: EM | Admit: 2023-02-10 | Discharge: 2023-02-11 | Disposition: A | Discharge status: Home / Self Care | Payer: BC Managed Care – PPO | Source: Home / Self Care | Attending: Emergency Medicine | Admitting: Emergency Medicine

## 2023-02-10 ENCOUNTER — Encounter (HOSPITAL_BASED_OUTPATIENT_CLINIC_OR_DEPARTMENT_OTHER): Payer: Self-pay

## 2023-02-10 ENCOUNTER — Other Ambulatory Visit: Payer: Self-pay

## 2023-02-10 ENCOUNTER — Emergency Department (HOSPITAL_BASED_OUTPATIENT_CLINIC_OR_DEPARTMENT_OTHER): Payer: BC Managed Care – PPO

## 2023-02-10 DIAGNOSIS — R0989 Other specified symptoms and signs involving the circulatory and respiratory systems: Secondary | ICD-10-CM | POA: Insufficient documentation

## 2023-02-10 DIAGNOSIS — Z7982 Long term (current) use of aspirin: Secondary | ICD-10-CM | POA: Insufficient documentation

## 2023-02-10 DIAGNOSIS — R09A2 Foreign body sensation, throat: Secondary | ICD-10-CM

## 2023-02-10 LAB — PREGNANCY, URINE: Preg Test, Ur: NEGATIVE

## 2023-02-10 NOTE — ED Triage Notes (Signed)
Pt states she was eating chicken wings for dinner, states she feels something got stuck in her throat, has "sensation to cough & clear throat keeps happening," worsening over last 2.5hrs. Pt advises she thinks it is "more like meat than bone- nothing sharp or stabbing,"  Pt able to speak in complete sentences, managing own secretions

## 2023-02-11 NOTE — ED Provider Notes (Signed)
Marilyn Black Provider Note   CSN: [id number] Arrival date & time: 02/10/23  2137     History Chief Complaint  Patient presents with   globus    Marilyn Black is a 42 y.o. female presents to the emerged from today for evaluation of foreign body sensation of the throat.  Patient reports that she was eating chicken wings and took a big bite and felt something got stuck in her throat.  She reports that she was trying to clear her throat and cough however this did not improve.  She reports it became painful.  She tried drinking water and eating bread but seem to make it worse.  She has pain with swallowing but no pain with talking.  She does not feel short of breath or have trouble breathing.  No pain with talking.  She is controlling her secretions.  She denies any fever, rhinorrhea, nasal dressing, or sore throat prior to eating the chicken wing.  HPI     Home Medications Prior to Admission medications   Medication Sig Start Date End Date Taking? Authorizing Provider  ASPIRIN 81 PO Take 81 mg by mouth daily.     [provider]  Cholecalciferol (VITAMIN D) 2000 units tablet Take 2,000 Units by mouth at bedtime.    [provider]  COVID-19 mRNA bivalent vaccine, Moderna, (MODERNA COVID-19 BIVAL BOOSTER) 50 MCG/0.5ML injection Inject into the muscle. 04/19/21   Judyann Munson, MD  FOLIC ACID PO Take 4 mg by mouth.    [provider]  ibuprofen (ADVIL) 800 MG tablet Take 1 tablet (800 mg total) by mouth every 8 (eight) hours as needed. 08/07/22   Cornett, Maisie Fus, MD  INCASSIA 0.35 MG tablet Take 1 tablet (0.35 mg total) by mouth at bedtime. 12/25/21   Romualdo Bolk, MD  influenza vac split quadrivalent PF (FLUARIX) 0.5 ML injection Inject into the muscle. 04/25/21   Judyann Munson, MD  Omega-3 Fatty Acids (OMEGA 3 PO) Take by mouth.    [provider]  Oxcarbazepine (TRILEPTAL) 300 MG tablet Take 300 mg by  mouth 2 (two) times daily.     [provider]  oxyCODONE (OXY IR/ROXICODONE) 5 MG immediate release tablet Take 1 tablet (5 mg total) by mouth every 6 (six) hours as needed for severe pain. 08/07/22   Cornett, Maisie Fus, MD  rosuvastatin (CRESTOR) 5 MG tablet Take 1 tablet by mouth daily.    [provider]      Allergies    Codeine, Vancomycin, Escitalopram, Penicillins, Sulfa antibiotics, Erythromycin base, and Cefdinir    Review of Systems   Review of Systems  Constitutional:  Positive for fever. Negative for chills.  HENT:  Positive for sore throat. Negative for congestion, drooling and rhinorrhea.   Respiratory:  Negative for shortness of breath.   Cardiovascular:  Negative for chest pain.  Gastrointestinal:  Negative for abdominal pain, nausea and vomiting.    Physical Exam Updated Vital Signs BP 120/75   Pulse 90   Temp 98.2 F (36.8 C)   Resp 17   SpO2 99%  Physical Exam Constitutional:      General: She is not in acute distress.    Appearance: Normal appearance. She is not ill-appearing or toxic-appearing.  HENT:     Mouth/Throat:     Mouth: Mucous membranes are moist.     Comments: No pharyngeal erythema, edema, or exudate noted.  Moist membranes.  Airway patent.  Uvula midline.  Controlling secretions.  Normal speech. Eyes:     General: No scleral icterus. Neck:      Comments: Do not see any obvious swelling.  Patient has tenderness to the marked area.  No overlying skin changes seen.  No overlying erythema, warmth, fluctuation, or induration. Pulmonary:     Effort: Pulmonary effort is normal. No respiratory distress.     Comments: Lung sounds are clear bilaterally.  I do not appreciate any stridor.  She is speaking in full sentences with ease and satting well on room air without increased work of breathing. Musculoskeletal:     Cervical back: Normal range of motion.  Lymphadenopathy:     Cervical: No cervical adenopathy.  Skin:    General: Skin  is dry.     Findings: No rash.  Neurological:     General: No focal deficit present.     Mental Status: She is alert. Mental status is at baseline.  Psychiatric:        Mood and Affect: Mood normal.     ED Results / Procedures / Treatments   Labs (all labs ordered are listed, but only abnormal results are displayed) Labs Reviewed  PREGNANCY, URINE    EKG None  Radiology No results found.  Procedures Procedures   Medications Ordered in ED Medications - No data to display  ED Course/ Medical Decision Making/ A&P                               Medical Decision Making Amount and/or Complexity of Data Reviewed Labs: ordered. Radiology: ordered.   42 y.o. female presents to the ER for evaluation of FBS in throat. Differential diagnosis includes but is not limited to abrasion, foreign body, pharyngitis. Vital signs unremarkable. Physical exam as noted above.   I independently reviewed and interpreted the patient's labs.  Pregnancy test is negative.  I consulted [facility] and spoke with Dr. Dulce Sellar.  He recommends doing a CT of the patient's neck.  If it is negative, the patient can follow-up outpatient.  If something is seen, depends on the area rather ENT or GI any see page.  CT pending.   12:46 AM Care of [name]  transferred to Dr. Eudelia Bunch at the end of my shift as the patient will require reassessment once labs/imaging have resulted. Patient presentation, ED course, and plan of care discussed with review of all pertinent labs and imaging. Please see his/her note for further details regarding further ED course and disposition. Plan at time of handoff is follow up with CT results. Dispo to be determined at that point. This may be altered or completely changed at the discretion of the oncoming team pending results of further workup.  Portions of this report may have been transcribed using voice recognition software. Every effort was made to ensure accuracy; however,  inadvertent computerized transcription errors may be present.   Final Clinical Impression(s) / ED Diagnoses Final diagnoses:  None    Rx / DC Orders ED Discharge Orders     None         Achille Rich, PA-C 02/11/23 0047    Melene Plan, DO 02/11/23 1500

## 2023-02-11 NOTE — ED Provider Notes (Signed)
I assumed care of this patient from previous provider.  Please see their note for further details of history, exam, and MDM.   Briefly patient is a 42 y.o. female who presented with globus sensation concerned for chicken bone. Pending CT.  CT negative for bone. ENT f/u as needed.  The patient appears reasonably screened and/or stabilized for discharge and I doubt any other medical condition or other [facility] requiring further screening, evaluation, or treatment in the ED at this time. I have discussed the findings, Dx and Tx plan with the patient/family who expressed understanding and agree(s) with the plan. Discharge instructions discussed at length. The patient/family was given strict return precautions who verbalized understanding of the instructions. No further questions at time of discharge.  Disposition: Discharge  Condition: Good  ED Discharge Orders     None         Follow Up: Porfirio Oar, PA [address] [place] [address] [phone]  Call  to schedule an appointment for close follow up  Christia Reading, MD [address] [place] [address] [phone]  Call  as needed          Nira Conn, MD 02/11/23 [id number]

## 2024-01-02 ENCOUNTER — Encounter: Payer: Self-pay | Admitting: Obstetrics and Gynecology
